# Patient Record
Sex: Female | Born: 1995 | Race: White | Hispanic: No | Marital: Married | Smoking: Current every day smoker
Health system: Southern US, Community
[De-identification: ages and names within clinical notes are randomized; demographics above are authoritative.]

## PROBLEM LIST (undated history)

## (undated) DIAGNOSIS — K219 Gastro-esophageal reflux disease without esophagitis: Secondary | ICD-10-CM

## (undated) DIAGNOSIS — Z8489 Family history of other specified conditions: Secondary | ICD-10-CM

## (undated) DIAGNOSIS — D649 Anemia, unspecified: Secondary | ICD-10-CM

## (undated) DIAGNOSIS — T8859XA Other complications of anesthesia, initial encounter: Secondary | ICD-10-CM

## (undated) DIAGNOSIS — I7300 Raynaud's syndrome without gangrene: Secondary | ICD-10-CM

## (undated) DIAGNOSIS — R030 Elevated blood-pressure reading, without diagnosis of hypertension: Secondary | ICD-10-CM

## (undated) HISTORY — PX: WISDOM TOOTH EXTRACTION: SHX21

---
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
## 2018-02-17 ENCOUNTER — Encounter: Payer: Self-pay | Admitting: Obstetrics and Gynecology

## 2018-03-20 ENCOUNTER — Ambulatory Visit: Payer: Self-pay | Admitting: Nurse Practitioner

## 2018-03-20 ENCOUNTER — Other Ambulatory Visit: Payer: Self-pay

## 2020-01-24 ENCOUNTER — Ambulatory Visit: Payer: Self-pay | Admitting: Nurse Practitioner

## 2020-01-26 ENCOUNTER — Telehealth: Payer: Self-pay

## 2020-01-26 NOTE — Telephone Encounter (Signed)
UNABLE TO CONFIRM  01-28-20 APPT WAS NOT A WORKING NUMBER LISTED IN PATIENTS VHART.

## 2020-01-28 ENCOUNTER — Encounter: Payer: Self-pay | Admitting: Nurse Practitioner

## 2020-01-28 ENCOUNTER — Encounter (INDEPENDENT_AMBULATORY_CARE_PROVIDER_SITE_OTHER): Payer: Self-pay

## 2020-01-28 ENCOUNTER — Ambulatory Visit: Payer: BC Managed Care – PPO | Admitting: Nurse Practitioner

## 2020-01-28 ENCOUNTER — Other Ambulatory Visit: Payer: Self-pay

## 2020-01-28 VITALS — BP 125/85 | HR 103 | Temp 98.0°F | Resp 16 | Ht 64.0 in | Wt 175.6 lb

## 2020-01-28 DIAGNOSIS — R209 Unspecified disturbances of skin sensation: Secondary | ICD-10-CM | POA: Diagnosis not present

## 2020-01-28 DIAGNOSIS — I7300 Raynaud's syndrome without gangrene: Secondary | ICD-10-CM | POA: Diagnosis not present

## 2020-01-28 DIAGNOSIS — R0989 Other specified symptoms and signs involving the circulatory and respiratory systems: Secondary | ICD-10-CM

## 2020-01-28 DIAGNOSIS — R5383 Other fatigue: Secondary | ICD-10-CM | POA: Diagnosis not present

## 2020-01-28 DIAGNOSIS — R300 Dysuria: Secondary | ICD-10-CM

## 2020-01-28 LAB — POCT URINALYSIS DIPSTICK
Bilirubin, UA: NEGATIVE
Glucose, UA: NEGATIVE
Ketones, UA: NEGATIVE
Leukocytes, UA: NEGATIVE
Nitrite, UA: NEGATIVE
Protein, UA: NEGATIVE
Spec Grav, UA: 1.015 (ref 1.010–1.025)
Urobilinogen, UA: 0.2 E.U./dL
pH, UA: 7.5 (ref 5.0–8.0)

## 2020-01-28 NOTE — Progress Notes (Signed)
[facility] [place], [place] [address]  Internal MEDICINE  Office Visit Note  Patient Name: Kathryn Estrada  [id number]  [id number]  Date of Service: 01/30/2020    Pt is here for a sick visit.   Chief Complaint  Patient presents with  . Urinary Tract Infection    frequency   . Eye Problem    gets blood shot red, when stressed sometimes and at night time   . Cold Feet    feet stay cold, hurt,  they are sometimes white or sometimes black, numbing, tingling, doesnt have to be cold outside   . Weight Management     The patient is here for acute visit. She states that her feet are frequently cold. When they get cold, they are painful. They will often turn blue or turn black. She has to use heating pad or two pairs of socks in order to get them warm and it does take a long time for them to get better. She has not had blood work done in some time. Has been on thyroid disease in the past. This caused her to have extreme hunger and gain weight. She has not been on this medication for some time.      Current Medication:  Outpatient Encounter Medications as of 01/28/2020  Medication Sig  . [DISCONTINUED] drospirenone-ethinyl estradiol (YASMIN,ZARAH,SYEDA) 3-0.03 MG tablet    No facility-administered encounter medications on file as of 01/28/2020.      Medical History: History reviewed. No pertinent past medical history.   Today's Vitals   01/28/20 1357  BP: 125/85  Pulse: (!) 103  Resp: 16  Temp: 98 F (36.7 C)  SpO2: 99%  Weight: 175 lb 9.6 oz (79.7 kg)  Height: 5\' 4"  (1.626 m)   Body mass index is 30.14 kg/m.  Review of Systems  Constitutional: Positive for fatigue. Negative for chills and unexpected weight change.  HENT: Negative for congestion, postnasal drip, rhinorrhea, sneezing and sore throat.   Respiratory: Negative for cough, chest tightness, shortness of breath and wheezing.   Cardiovascular: Negative for chest pain and  palpitations.       Very cold lower legs and feet. Can be painful.   Gastrointestinal: Negative for abdominal pain, constipation, diarrhea, nausea and vomiting.  Endocrine: Negative for cold intolerance, heat intolerance, polydipsia and polyuria.  Musculoskeletal: Positive for arthralgias. Negative for back pain, joint swelling and neck pain.  Skin: Positive for color change. Negative for rash.       Blue or black discoloring to the lower extremities and feet, especially when the feet are cold.   Allergic/Immunologic: Negative for environmental allergies.  Neurological: Negative for dizziness, tremors, numbness and headaches.  Hematological: Negative for adenopathy. Does not bruise/bleed easily.  Psychiatric/Behavioral: Negative for behavioral problems (Depression), sleep disturbance and suicidal ideas. The patient is not nervous/anxious.     Physical Exam Vitals and nursing note reviewed.  Constitutional:      General: She is not in acute distress.    Appearance: Normal appearance. She is well-developed. She is not diaphoretic.  HENT:     Head: Normocephalic and atraumatic.     Nose: Nose normal.     Mouth/Throat:     Pharynx: No oropharyngeal exudate.  Eyes:     Pupils: Pupils are equal, round, and reactive to light.  Neck:     Thyroid: No thyromegaly.     Vascular: No JVD.     Trachea: No tracheal deviation.  Cardiovascular:  Rate and Rhythm: Normal rate and regular rhythm.     Heart sounds: Normal heart sounds. No murmur. No friction rub. No gallop.      Comments: Both feet and lower legs are cool to touch with delayed capillary refill. Per ABI results today, there is mildly decreased arterial circulation to the right lower leg.  Pulmonary:     Effort: Pulmonary effort is normal. No respiratory distress.     Breath sounds: Normal breath sounds. No wheezing or rales.  Chest:     Chest wall: No tenderness.  Abdominal:     General: Bowel sounds are normal.     Palpations:  Abdomen is soft.     Tenderness: There is no abdominal tenderness.  Genitourinary:    Comments: Urine sample is positive for moderate blood .no evidence of infection or other abnormalities.  Musculoskeletal:        General: Normal range of motion.     Cervical back: Normal range of motion and neck supple.  Lymphadenopathy:     Cervical: No cervical adenopathy.  Skin:    General: Skin is warm and dry.     Capillary Refill: Capillary refill takes more than 3 seconds.  Neurological:     Mental Status: She is alert and oriented to person, place, and time.     Cranial Nerves: No cranial nerve deficit.  Psychiatric:        Mood and Affect: Mood normal.        Behavior: Behavior normal.        Thought Content: Thought content normal.        Judgment: Judgment normal.   Assessment/Plan: 1. Raynaud's phenomenon without gangrene ABI done in the office today. Mildly decreased arterial circulation of right lower extremity. Will get arterial duplex ultrasound of both lower extremities for further evaluation.  - [place] Lower Ext Art Bilat; Future  2. Prolonged capillary refill time ABI done in the office today. Mildly decreased arterial circulation of right lower extremity. Will get arterial duplex ultrasound of both lower extremities for further evaluation.  - [place] Lower Ext Art Bilat; Future  3. Bilateral cold feet ABI done in the office today. Mildly decreased arterial circulation of right lower extremity. Will get arterial duplex ultrasound of both lower extremities for further evaluation.  - POCT ABI Screening Pilot No Charge - [place] Lower Ext Art Bilat; Future  4. Other fatigue Will check labs including anemia, thyroid, and connective tissue panels for further evaluation.   5. Dysuria There is moderate blood in the urine without evidence of infection or other abnormalities.  - POCT Urinalysis Dipstick  General Counseling: ahlivia gains understanding of the findings of todays visit and  agrees with plan of treatment. I have discussed any further diagnostic evaluation that may be needed or ordered today. We also reviewed her medications today. she has been encouraged to call the office with any questions or concerns that should arise related to todays visit.    Counseling:  This patient was seen by Leretha Pol FNP Collaboration with Dr Lavera Guise as a part of collaborative care agreement  Orders Placed This Encounter  Procedures  . [place] Lower Ext Art Bilat  . POCT Urinalysis Dipstick  . POCT ABI Screening Pilot No Charge      Time spent: 45 Minutes

## 2020-01-30 DIAGNOSIS — R209 Unspecified disturbances of skin sensation: Secondary | ICD-10-CM | POA: Insufficient documentation

## 2020-01-30 DIAGNOSIS — R5383 Other fatigue: Secondary | ICD-10-CM | POA: Insufficient documentation

## 2020-01-30 DIAGNOSIS — R0989 Other specified symptoms and signs involving the circulatory and respiratory systems: Secondary | ICD-10-CM | POA: Insufficient documentation

## 2020-01-30 DIAGNOSIS — I7300 Raynaud's syndrome without gangrene: Secondary | ICD-10-CM | POA: Insufficient documentation

## 2020-01-30 DIAGNOSIS — R300 Dysuria: Secondary | ICD-10-CM | POA: Insufficient documentation

## 2020-02-22 ENCOUNTER — Other Ambulatory Visit: Payer: Self-pay | Admitting: Nurse Practitioner

## 2020-02-23 NOTE — Progress Notes (Signed)
Waiting on few results. Discuss at visit 03/03/2020

## 2020-02-25 ENCOUNTER — Other Ambulatory Visit: Payer: Self-pay

## 2020-02-25 ENCOUNTER — Ambulatory Visit (INDEPENDENT_AMBULATORY_CARE_PROVIDER_SITE_OTHER): Payer: BC Managed Care – PPO

## 2020-02-25 DIAGNOSIS — R209 Unspecified disturbances of skin sensation: Secondary | ICD-10-CM

## 2020-02-25 DIAGNOSIS — I7300 Raynaud's syndrome without gangrene: Secondary | ICD-10-CM

## 2020-02-25 DIAGNOSIS — R0989 Other specified symptoms and signs involving the circulatory and respiratory systems: Secondary | ICD-10-CM

## 2020-02-26 LAB — COMPREHENSIVE METABOLIC PANEL
ALT: 11 IU/L (ref 0–32)
AST: 19 IU/L (ref 0–40)
Albumin/Globulin Ratio: 1.8 (ref 1.2–2.2)
Albumin: 4.7 g/dL (ref 3.9–5.0)
Alkaline Phosphatase: 77 IU/L (ref 39–117)
BUN/Creatinine Ratio: 13 (ref 9–23)
BUN: 10 mg/dL (ref 6–20)
Bilirubin Total: 0.3 mg/dL (ref 0.0–1.2)
CO2: 24 mmol/L (ref 20–29)
Calcium: 10 mg/dL (ref 8.7–10.2)
Chloride: 101 mmol/L (ref 96–106)
Creatinine, Ser: 0.8 mg/dL (ref 0.57–1.00)
GFR calc Af Amer: 120 mL/min/{1.73_m2} (ref >59)
GFR calc non Af Amer: 104 mL/min/{1.73_m2} (ref >59)
Globulin, Total: 2.6 g/dL (ref 1.5–4.5)
Glucose: 91 mg/dL (ref 65–99)
Potassium: 4.2 mmol/L (ref 3.5–5.2)
Sodium: 138 mmol/L (ref 134–144)
Total Protein: 7.3 g/dL (ref 6.0–8.5)

## 2020-02-26 LAB — HGB A1C W/O EAG: Hgb A1c MFr Bld: 4.9 % (ref 4.8–5.6)

## 2020-02-26 LAB — CBC
Hematocrit: 43.4 % (ref 34.0–46.6)
Hemoglobin: 14.5 g/dL (ref 11.1–15.9)
MCH: 29.5 pg (ref 26.6–33.0)
MCHC: 33.4 g/dL (ref 31.5–35.7)
MCV: 88 fL (ref 79–97)
Platelets: 273 10*3/uL (ref 150–450)
RBC: 4.92 x10E6/uL (ref 3.77–5.28)
RDW: 12.1 % (ref 11.7–15.4)
WBC: 7.1 10*3/uL (ref 3.4–10.8)

## 2020-02-26 LAB — B12 AND FOLATE PANEL
Folate: 10.8 ng/mL (ref >3.0)
Vitamin B-12: 225 pg/mL — ABNORMAL LOW (ref 232–1245)

## 2020-02-26 LAB — IRON AND TIBC
Iron Saturation: 35 % (ref 15–55)
Iron: 105 ug/dL (ref 27–159)
Total Iron Binding Capacity: 299 ug/dL (ref 250–450)
UIBC: 194 ug/dL (ref 131–425)

## 2020-02-26 LAB — FERRITIN: Ferritin: 67 ng/mL (ref 15–150)

## 2020-02-26 LAB — ANA W/REFLEX IF POSITIVE: Anti Nuclear Antibody (ANA): NEGATIVE

## 2020-02-26 LAB — TSH: TSH: 1.57 u[IU]/mL (ref 0.450–4.500)

## 2020-02-26 LAB — THYROID AUTOABS TEST GROUP
Anti-Thyroglobulin Antibodies: 1.5 IU/mL — ABNORMAL HIGH
Anti-Thyroid Peroxidase Ab: 568 IU/mL — ABNORMAL HIGH

## 2020-02-26 LAB — SEDIMENTATION RATE: Sed Rate: 15 mm/hr (ref 0–32)

## 2020-02-26 LAB — RHEUMATOID FACTOR: Rheumatoid fact SerPl-aCnc: <10 IU/mL (ref 0.0–13.9)

## 2020-02-26 LAB — VITAMIN D 25 HYDROXY (VIT D DEFICIENCY, FRACTURES): Vit D, 25-Hydroxy: 25.1 ng/mL — ABNORMAL LOW (ref 30.0–100.0)

## 2020-02-26 LAB — T4, FREE: Free T4: 1.11 ng/dL (ref 0.82–1.77)

## 2020-03-01 ENCOUNTER — Telehealth: Payer: Self-pay

## 2020-03-01 NOTE — Progress Notes (Signed)
Normal circulation. Discuss at visit 03/03/2020

## 2020-03-01 NOTE — Telephone Encounter (Signed)
CONFIRMED AND SCREENED FOR 03-03-20 OV.

## 2020-03-03 ENCOUNTER — Other Ambulatory Visit: Payer: Self-pay

## 2020-03-03 ENCOUNTER — Ambulatory Visit: Payer: BC Managed Care – PPO | Admitting: Nurse Practitioner

## 2020-03-03 ENCOUNTER — Encounter: Payer: Self-pay | Admitting: Nurse Practitioner

## 2020-03-03 VITALS — BP 119/74 | HR 85 | Temp 97.8°F | Resp 16 | Ht 65.0 in | Wt 177.2 lb

## 2020-03-03 DIAGNOSIS — Z6829 Body mass index (BMI) 29.0-29.9, adult: Secondary | ICD-10-CM

## 2020-03-03 DIAGNOSIS — R7989 Other specified abnormal findings of blood chemistry: Secondary | ICD-10-CM | POA: Diagnosis not present

## 2020-03-03 DIAGNOSIS — I7300 Raynaud's syndrome without gangrene: Secondary | ICD-10-CM

## 2020-03-03 DIAGNOSIS — R0989 Other specified symptoms and signs involving the circulatory and respiratory systems: Secondary | ICD-10-CM | POA: Diagnosis not present

## 2020-03-03 DIAGNOSIS — R209 Unspecified disturbances of skin sensation: Secondary | ICD-10-CM

## 2020-03-03 MED ORDER — PHENTERMINE HCL 37.5 MG PO TABS: 37.5000 mg | ORAL_TABLET | Freq: Every day | ORAL | 1 refills | Status: DC

## 2020-03-03 NOTE — Progress Notes (Signed)
[facility] [place], [place] [address]  Internal MEDICINE  Office Visit Note  Patient Name: Kathryn Estrada  [id number]  [id number]  Date of Service: 03/12/2020  Chief Complaint  Patient presents with  . Follow-up    [place] results    The patient is here for follow up of lab and ultrasound results. She had been having frequently cold feet. When they get cold, they are painful. They will often turn blue or turn black. She has to use heating pad or two pairs of socks in order to get them warm and it does take a long time for them to get better. An ABI was done at her last visit, which was unremarkable.  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 Ultrasound of the arteries in both lower extremities indicate no evidence of stenosis. She continues to have significant fatigue and weight gain. She would like to get started on appetite suppressant to help get started with healthy weight loss program.      Current Medication: Outpatient Encounter Medications as of 03/03/2020  Medication Sig  . phentermine (ADIPEX-P) 37.5 MG tablet Take 1 tablet (37.5 mg total) by mouth daily before breakfast.   No facility-administered encounter medications on file as of 03/03/2020.    Surgical History: History reviewed. No pertinent surgical history.  Medical History: History reviewed. No pertinent past medical history.  Family History: Family History  Problem Relation Age of Onset  . Neurofibromatosis Mother   . Anemia Mother   . Diabetes Other     Social History   Socioeconomic History  . Marital status: Single    Spouse name: Not on file  . Number of children: Not on file  . Years of education: Not on file  . Highest education level: Not on file  Occupational History  . Not on file  Tobacco Use  . Smoking status: Former Smoker    Types: Cigarettes  . Smokeless tobacco: Never Used   Substance and Sexual Activity  . Alcohol use: Yes    Comment: ocassinally/ once a month   . Drug use: Never  . Sexual activity: Not on file  Other Topics Concern  . Not on file  Social History Narrative  . Not on file   Social Determinants of Health   Financial Resource Strain:   . Difficulty of Paying Living Expenses:   Food Insecurity:   . Worried About Charity fundraiser in the Last Year:   . Arboriculturist in the Last Year:   Transportation Needs:   . Film/video editor (Medical):   Marland Kitchen Lack of Transportation (Non-Medical):   Physical Activity:   . Days of Exercise per Week:   . Minutes of Exercise per Session:   Stress:   . Feeling of Stress :   Social Connections:   . Frequency of Communication with Friends and Family:   . Frequency of Social Gatherings with Friends and Family:   . Attends Religious Services:   . Active Member of Clubs or Organizations:   . Attends Archivist Meetings:   Marland Kitchen Marital Status:   Intimate Partner Violence:   . Fear of Current or Ex-Partner:   . Emotionally Abused:   Marland Kitchen Physically Abused:   . Sexually Abused:       Review of Systems  Constitutional: Positive for fatigue. Negative for chills and unexpected weight change.  Two pound weight gin since her last visit.   HENT: Negative for congestion, postnasal drip, rhinorrhea, sneezing and sore throat.   Respiratory: Negative for cough, chest tightness, shortness of breath and wheezing.   Cardiovascular: Negative for chest pain and palpitations.       Very cold lower legs and feet. Can be painful.   Gastrointestinal: Negative for abdominal pain, constipation, diarrhea, nausea and vomiting.  Endocrine: Negative for cold intolerance, heat intolerance, polydipsia and polyuria.  Musculoskeletal: Positive for arthralgias. Negative for back pain, joint swelling and neck pain.  Skin: Positive for color change. Negative for rash.       Blue or black discoloring to the lower  extremities and feet, especially when the feet are cold.   Allergic/Immunologic: Negative for environmental allergies.  Neurological: Negative for dizziness, tremors, numbness and headaches.  Hematological: Negative for adenopathy. Does not bruise/bleed easily.  Psychiatric/Behavioral: Negative for behavioral problems (Depression), sleep disturbance and suicidal ideas. The patient is not nervous/anxious.     Today's Vitals   03/03/20 1533  BP: 119/74  Pulse: 85  Resp: 16  Temp: 97.8 F (36.6 C)  SpO2: 100%  Weight: 177 lb 3.2 oz (80.4 kg)  Height: 5\' 5"  (1.651 m)   Body mass index is 29.49 kg/m.  Physical Exam Vitals and nursing note reviewed.  Constitutional:      General: She is not in acute distress.    Appearance: Normal appearance. She is well-developed. She is not diaphoretic.  HENT:     Head: Normocephalic and atraumatic.     Nose: Nose normal.     Mouth/Throat:     Pharynx: No oropharyngeal exudate.  Eyes:     Pupils: Pupils are equal, round, and reactive to light.  Neck:     Thyroid: No thyromegaly.     Vascular: No JVD.     Trachea: No tracheal deviation.  Cardiovascular:     Rate and Rhythm: Normal rate and regular rhythm.     Heart sounds: Normal heart sounds. No murmur. No friction rub. No gallop.      Comments: Both feet and lower legs are cool to touch with delayed capillary refill. Per ABI results today, there is mildly decreased arterial circulation to the right lower leg.  Pulmonary:     Effort: Pulmonary effort is normal. No respiratory distress.     Breath sounds: Normal breath sounds. No wheezing or rales.  Chest:     Chest wall: No tenderness.  Abdominal:     General: Bowel sounds are normal.     Palpations: Abdomen is soft.     Tenderness: There is no abdominal tenderness.  Genitourinary:    Comments: Urine sample is positive for moderate blood .no evidence of infection or other abnormalities.  Musculoskeletal:        General: Normal range  of motion.     Cervical back: Normal range of motion and neck supple.  Lymphadenopathy:     Cervical: No cervical adenopathy.  Skin:    General: Skin is warm and dry.     Capillary Refill: Capillary refill takes more than 3 seconds.  Neurological:     Mental Status: She is alert and oriented to person, place, and time.     Cranial Nerves: No cranial nerve deficit.  Psychiatric:        Mood and Affect: Mood normal.        Behavior: Behavior normal.        Thought Content: Thought content normal.  Judgment: Judgment normal.     Assessment/Plan: 1. Raynaud's phenomenon without gangrene Reviewed results of ABI and ultrasound of arteries of bilateral lower extremities. Studies were unremarkable. Symptoms persistent. Refer to vascular surgery for further evaluation.  - Ambulatory referral to Vascular Surgery  2. Prolonged capillary refill time Capillary refill moderately delayed. Refer to vascular surgery for further evaluation.  - Ambulatory referral to Vascular Surgery  3. Bilateral cold feet Refer to vascular surgery for further evaluation.  - Ambulatory referral to Vascular Surgery  4. Abnormal thyroid blood test Ultrasound of thyroid for further evaluation.  - US THYROID; Future  5. BMI 29.0-29.9,adult Trial of phentermine to help with weight loss. Recommend 1200 calorie diet. Recommend she incorporate exercise into her daily routine.  - phentermine (ADIPEX-P) 37.5 MG tablet; Take 1 tablet (37.5 mg total) by mouth daily before breakfast.  Dispense: 30 tablet; Refill: 1  General Counseling: Starley verbalizes understanding of the findings of todays visit and agrees with plan of treatment. I have discussed any further diagnostic evaluation that may be needed or ordered today. We also reviewed her medications today. she has been encouraged to call the office with any questions or concerns that should arise related to todays visit.   There is a liability release in patients'  chart. There has been a 10 minute discussion about the side effects including but not limited to elevated blood pressure, anxiety, lack of sleep and dry mouth. Pt understands and will like to start/continue on appetite suppressant at this time. There will be one month RX given at the time of visit with proper follow up. Nova diet plan with restricted calories is given to the pt. Pt understands and agrees with  plan of treatment  This patient was seen by Leretha Pol FNP Collaboration with Dr Lavera Guise as a part of collaborative care agreement  Orders Placed This Encounter  Procedures  . US THYROID  . Ambulatory referral to Vascular Surgery    Meds ordered this encounter  Medications  . phentermine (ADIPEX-P) 37.5 MG tablet    Sig: Take 1 tablet (37.5 mg total) by mouth daily before breakfast.    Dispense:  30 tablet    Refill:  1    Order Specific Question:   Supervising Provider    Answer:   Lavera Guise [id number]    Total time spent: 30 Minutes   Time spent includes review of chart, medications, test results, and follow up plan with the patient.      Dr Lavera Guise Internal medicine

## 2020-03-12 DIAGNOSIS — R7989 Other specified abnormal findings of blood chemistry: Secondary | ICD-10-CM | POA: Insufficient documentation

## 2020-03-12 DIAGNOSIS — Z6829 Body mass index (BMI) 29.0-29.9, adult: Secondary | ICD-10-CM | POA: Insufficient documentation

## 2020-03-28 ENCOUNTER — Encounter (INDEPENDENT_AMBULATORY_CARE_PROVIDER_SITE_OTHER): Payer: Self-pay | Admitting: Vascular Surgery

## 2020-03-28 ENCOUNTER — Other Ambulatory Visit: Payer: Self-pay

## 2020-03-28 ENCOUNTER — Ambulatory Visit (INDEPENDENT_AMBULATORY_CARE_PROVIDER_SITE_OTHER): Payer: BC Managed Care – PPO | Admitting: Vascular Surgery

## 2020-03-28 VITALS — BP 125/82 | HR 96 | Ht 65.0 in | Wt 174.0 lb

## 2020-03-28 DIAGNOSIS — F17200 Nicotine dependence, unspecified, uncomplicated: Secondary | ICD-10-CM | POA: Diagnosis not present

## 2020-03-28 DIAGNOSIS — I7300 Raynaud's syndrome without gangrene: Secondary | ICD-10-CM | POA: Diagnosis not present

## 2020-03-28 NOTE — Progress Notes (Signed)
Patient ID: Kathryn Estrada, female   DOB: 12-17-1996, 24 y.o.   MRN: [id number]  Chief Complaint  Patient presents with  . New Patient (Initial Visit)    Kathryn Estrada    HPI Kathryn Estrada is a 24 y.o. female.  I am asked to see the patient by H. Boscia for evaluation of Raynaud's disease.  Her symptoms been worst over the past 4 to 5 months, but is something she has noticed some degree for many years.  She basically has cold, purple feet all of the time.  She will have intermittent episodes where toes or her forefoot will turn white and very painful.  Outside of the episodes where the toes turn white, it is not painful.  She has some symptoms in her hand but nowhere near as severe.  Both her hands and feet are cool much of the time.  She denies any trauma or injury.  She has had an extensive medical work-up by her primary care physician including what sounds like lower extremity ultrasounds recently.  Both feet are affected about the same.  This does not really come on only in the cold and she notices it all the time.  It is difficult to say if cold exacerbates it for her.     No past medical history on file.  No past surgical history on file.   Family History  Problem Relation Age of Onset  . Neurofibromatosis Mother   . Anemia Mother   . Diabetes Other     Social History   Tobacco Use  . Smoking status: Former Smoker    Types: Cigarettes  . Smokeless tobacco: Never Used  Substance Use Topics  . Alcohol use: Yes    Comment: ocassinally/ once a month   . Drug use: Never     No Known Allergies  Current Outpatient Medications  Medication Sig Dispense Refill  . phentermine (ADIPEX-P) 37.5 MG tablet Take 1 tablet (37.5 mg total) by mouth daily before breakfast. 30 tablet 1   No current facility-administered medications for this visit.      REVIEW OF SYSTEMS (Negative unless checked)  Constitutional: [] Weight loss  [] Fever  [] Chills Cardiac: [] Chest Estrada   [] Chest  pressure   [] Palpitations   [] Shortness of breath when laying flat   [] Shortness of breath at rest   [] Shortness of breath with exertion. Vascular:  [] Estrada in legs with walking   [] Estrada in legs at rest   [] Estrada in legs when laying flat   [] Claudication   [x] Estrada in feet when walking  [x] Estrada in feet at rest  [] Estrada in feet when laying flat   [] History of DVT   [] Phlebitis   [] Swelling in legs   [] Varicose veins   [] Non-healing ulcers Pulmonary:   [] Uses home oxygen   [] Productive cough   [] Hemoptysis   [] Wheeze  [] COPD   [] Asthma Neurologic:  [] Dizziness  [] Blackouts   [] Seizures   [] History of stroke   [] History of TIA  [] Aphasia   [] Temporary blindness   [] Dysphagia   [] Weakness or numbness in arms   [] Weakness or numbness in legs Musculoskeletal:  [] Arthritis   [] Joint swelling   [] Joint Estrada   [] Low back Estrada Hematologic:  [] Easy bruising  [] Easy bleeding   [] Hypercoagulable state   [] Anemic  [] Hepatitis Gastrointestinal:  [] Blood in stool   [] Vomiting blood  [] Gastroesophageal reflux/heartburn   [] Abdominal Estrada Genitourinary:  [] Chronic kidney disease   [] Difficult urination  [] Frequent urination  [] Burning with urination   []   Hematuria Skin:  [] Rashes   [] Ulcers   [] Wounds Psychological:  [] History of anxiety   []  History of major depression.    Physical Exam BP 125/82   Pulse 96   Ht 5\' 5"  (1.651 m)   Wt 174 lb (78.9 kg)   BMI 28.96 kg/m  Gen:  WD/WN, NAD Head: [place]/AT, No temporalis wasting.  Ear/Nose/Throat: Hearing grossly intact, nares w/o erythema or drainage, oropharynx w/o Erythema/Exudate Eyes: Conjunctiva clear, sclera non-icteric  Neck: trachea midline.  No JVD.  Pulmonary:  Good air movement, respirations not labored, no use of accessory muscles  Cardiac: RRR, no JVD Vascular:  Vessel Right Left  Radial Palpable Palpable                          DP 2+ 2+  PT 2+ 2+   Gastrointestinal:. No masses, surgical incisions, or scars. Musculoskeletal: M/S 5/5 throughout.   Extremities without ischemic changes.  No deformity or atrophy. No edema.  Purplish discoloration is present throughout both feet and ankle areas.  No open wounds or ulceration.  Mild cyanosis of the tips of several fingers but nothing is profound as her feet.  Feet and hands are slightly cool to the touch Neurologic: Sensation grossly intact in extremities.  Symmetrical.  Speech is fluent. Motor exam as listed above. Psychiatric: Judgment intact, Mood & affect appropriate for pt's clinical situation. Dermatologic: No rashes or ulcers noted.  No cellulitis or open wounds.    Radiology No results found.  Labs Recent Results (from the past 2160 hour(s))  POCT Urinalysis Dipstick     Status: None   Collection Time: 01/28/20  2:20 PM  Result Value Ref Range   Color, UA     Clarity, UA     Glucose, UA Negative Negative   Bilirubin, UA negative    Ketones, UA negative    Spec Grav, UA 1.015 1.010 - 1.025   Blood, UA moderate    pH, UA 7.5 5.0 - 8.0   Protein, UA Negative Negative   Urobilinogen, UA 0.2 0.2 or 1.0 E.U./dL   Nitrite, UA negative    Leukocytes, UA Negative Negative   Appearance     Odor    Comprehensive metabolic panel     Status: None   Collection Time: 02/22/20  3:58 PM  Result Value Ref Range   Glucose 91 65 - 99 mg/dL   BUN 10 6 - 20 mg/dL   Creatinine, Ser 0.80 0.57 - 1.00 mg/dL   GFR calc non Af Amer 104 >59 mL/min/1.73   GFR calc Af Amer 120 >59 mL/min/1.73   BUN/Creatinine Ratio 13 9 - 23   Sodium 138 134 - 144 mmol/L   Potassium 4.2 3.5 - 5.2 mmol/L   Chloride 101 96 - 106 mmol/L   CO2 24 20 - 29 mmol/L   Calcium 10.0 8.7 - 10.2 mg/dL   Total Protein 7.3 6.0 - 8.5 g/dL   Albumin 4.7 3.9 - 5.0 g/dL   Globulin, Total 2.6 1.5 - 4.5 g/dL   Albumin/Globulin Ratio 1.8 1.2 - 2.2   Bilirubin Total 0.3 0.0 - 1.2 mg/dL   Alkaline Phosphatase 77 39 - 117 IU/L   AST 19 0 - 40 IU/L   ALT 11 0 - 32 IU/L  CBC     Status: None   Collection Time: 02/22/20  3:58 PM   Result Value Ref Range   WBC 7.1 3.4 - 10.8 x10E3/uL  RBC 4.92 3.77 - 5.28 x10E6/uL   Hemoglobin 14.5 11.1 - 15.9 g/dL   Hematocrit 43.4 34.0 - 46.6 %   MCV 88 79 - 97 fL   MCH 29.5 26.6 - 33.0 pg   MCHC 33.4 31.5 - 35.7 g/dL   RDW 12.1 11.7 - 15.4 %   Platelets 273 150 - 450 x10E3/uL  Iron and TIBC     Status: None   Collection Time: 02/22/20  3:58 PM  Result Value Ref Range   Total Iron Binding Capacity 299 250 - 450 ug/dL   UIBC 194 131 - 425 ug/dL   Iron 105 27 - 159 ug/dL   Iron Saturation 35 15 - 55 %  B12 and Folate Panel     Status: Abnormal   Collection Time: 02/22/20  3:58 PM  Result Value Ref Range   Vitamin B-12 225 (L) 232 - 1,245 pg/mL   Folate 10.8 >3.0 ng/mL    Comment: A serum folate concentration of less than 3.1 ng/mL is considered to represent clinical deficiency.   Hgb A1c w/o eAG     Status: None   Collection Time: 02/22/20  3:58 PM  Result Value Ref Range   Hgb A1c MFr Bld 4.9 4.8 - 5.6 %    Comment:          Prediabetes: 5.7 - 6.4          Diabetes: >6.4          Glycemic control for adults with diabetes: <7.0   T4, free     Status: None   Collection Time: 02/22/20  3:58 PM  Result Value Ref Range   Free T4 1.11 0.82 - 1.77 ng/dL  TSH     Status: None   Collection Time: 02/22/20  3:58 PM  Result Value Ref Range   TSH 1.570 0.450 - 4.500 uIU/mL  Rheumatoid factor     Status: None   Collection Time: 02/22/20  3:58 PM  Result Value Ref Range   Rhuematoid fact SerPl-aCnc <10.0 0.0 - 13.9 IU/mL  VITAMIN D 25 Hydroxy (Vit-D Deficiency, Fractures)     Status: Abnormal   Collection Time: 02/22/20  3:58 PM  Result Value Ref Range   Vit D, 25-Hydroxy 25.1 (L) 30.0 - 100.0 ng/mL    Comment: Vitamin D deficiency has been defined by the [place] and an Endocrine Society practice guideline as a level of serum 25-OH vitamin D less than 20 ng/mL (1,2). The Endocrine Society went on to further define vitamin D insufficiency as a level  between 21 and 29 ng/mL (2). 1. IOM (Institute of Medicine). 2010. Dietary reference    intakes for calcium and D. [place]: The    [employer]. 2. Holick MF, Binkley Percy, Bischoff-Ferrari HA, et al.    Evaluation, treatment, and prevention of vitamin D    deficiency: an Endocrine Society clinical practice    guideline. JCEM. 2011 Jul; 96(7):1911-30.   ANA w/Reflex if Positive     Status: None   Collection Time: 02/22/20  3:58 PM  Result Value Ref Range   Anti Nuclear Antibody (ANA) Negative Negative  Thyroid AutoAbs Test Group     Status: Abnormal   Collection Time: 02/22/20  3:58 PM  Result Value Ref Range   Anti-Thyroglobulin Antibodies 1.5 (H) IU/mL    Comment: Reference Range: <1.0          Negative > or = 1.0    Positive    Anti-Thyroid  Peroxidase Ab 568 (H) IU/mL    Comment: Reference Range: All ages: <9.0   Sedimentation rate     Status: None   Collection Time: 02/22/20  3:58 PM  Result Value Ref Range   Sed Rate 15 0 - 32 mm/hr  Ferritin     Status: None   Collection Time: 02/22/20  3:58 PM  Result Value Ref Range   Ferritin 67 15 - 150 ng/mL    Assessment/Plan:  Raynaud disease The patient has fairly significant vasospastic disorder/Raynaud's disease.  This is not necessarily cold induced at this point and is present much of the time.  It is painful.  She does not have tissue loss.  I discussed with her options today.  It is strongly recommended that she quit vaping/smoking as nicotine and tobacco exposure going to worsen her vasospasm.  Her blood pressure is normal, we could certainly consider using calcium channel blockers or Nitropaste to relieve some of the vasospasm if her Estrada is severe enough.  She does not want that at this time and that is certainly reasonable.  Even though she has symptoms when it is not cold, I told her that cold will worsen the symptoms and I recommend she continue to wear gloves and socks no matter the temperature and try  to avoid cold stimulation.  I have offered her a follow-up interval in several months versus calling us if her symptoms worsen.  She would prefer the latter.  Tobacco use disorder The patient has ongoing use of vaping.  We discussed today that nicotine and tobacco will precipitate vasospasm and markedly worsen her vasospastic symptoms.  I have strongly recommended she quit smoking for both this reason as well as the many health benefits to cessation that would be present going forward.  We spent 3 to 4 minutes discussing this and she voices her understanding.      Kathryn Estrada 03/28/2020, 2:42 PM   This note was created with Dragon medical transcription system.  Any errors from dictation are unintentional.

## 2020-03-28 NOTE — Assessment & Plan Note (Signed)
The patient has fairly significant vasospastic disorder/Raynaud's disease.  This is not necessarily cold induced at this point and is present much of the time.  It is painful.  She does not have tissue loss.  I discussed with her options today.  It is strongly recommended that she quit vaping/smoking as nicotine and tobacco exposure going to worsen her vasospasm.  Her blood pressure is normal, we could certainly consider using calcium channel blockers or Nitropaste to relieve some of the vasospasm if her pain is severe enough.  She does not want that at this time and that is certainly reasonable.  Even though she has symptoms when it is not cold, I told her that cold will worsen the symptoms and I recommend she continue to wear gloves and socks no matter the temperature and try to avoid cold stimulation.  I have offered her a follow-up interval in several months versus calling us if her symptoms worsen.  She would prefer the latter.

## 2020-03-28 NOTE — Assessment & Plan Note (Signed)
The patient has ongoing use of vaping.  We discussed today that nicotine and tobacco will precipitate vasospasm and markedly worsen her vasospastic symptoms.  I have strongly recommended she quit smoking for both this reason as well as the many health benefits to cessation that would be present going forward.  We spent 3 to 4 minutes discussing this and she voices her understanding.

## 2020-03-28 NOTE — Patient Instructions (Signed)
Raynaud Phenomenon ° °Raynaud phenomenon is a condition that affects the blood vessels (arteries) that carry blood to your fingers and toes. The arteries that supply blood to your ears, lips, nipples, or the tip of your nose might also be affected. Raynaud phenomenon causes the arteries to become narrow temporarily (spasm). As a result, the flow of blood to the affected areas is temporarily decreased. This usually occurs in response to cold temperatures or stress. During an attack, the skin in the affected areas turns white, then blue, and finally red. You may also feel tingling or numbness in those areas. °Attacks usually last for only a brief period, and then the blood flow to the area returns to normal. In most cases, Raynaud phenomenon does not cause serious health problems. °What are the causes? °In many cases, the cause of this condition is not known. The condition may occur on its own (primary Raynaud phenomenon) or may be associated with other diseases or factors (secondary Raynaud phenomenon). °Possible causes may include: °· Diseases or medical conditions that damage the arteries. °· Injuries and repetitive actions that hurt the hands or feet. °· Being exposed to certain chemicals. °· Taking medicines that narrow the arteries. °· Other medical conditions, such as lupus, scleroderma, rheumatoid arthritis, thyroid problems, blood disorders, Sjogren syndrome, or atherosclerosis. °What increases the risk? °The following factors may make you more likely to develop this condition: °· Being 20-40 years old. °· Being female. °· Having a family history of Raynaud phenomenon. °· Living in a cold climate. °· Smoking. °What are the signs or symptoms? °Symptoms of this condition usually occur when you are exposed to cold temperatures or when you have emotional stress. The symptoms may last for a few minutes or up to several hours. They usually affect your fingers but may also affect your toes, nipples, lips, ears, or  the tip of your nose. Symptoms may include: °· Changes in skin color. The skin in the affected areas will turn pale or white. The skin may then change from white to bluish to red as normal blood flow returns to the area. °· Numbness, tingling, or pain in the affected areas. °In severe cases, symptoms may include: °· Skin sores. °· Tissues decaying and dying (gangrene). °How is this diagnosed? °This condition may be diagnosed based on: °· Your symptoms and medical history. °· A physical exam. During the exam, you may be asked to put your hands in cold water to check for a reaction to cold temperature. °· Tests, such as: °? Blood tests to check for other diseases or conditions. °? A test to check the movement of blood through your arteries and veins (vascular ultrasound). °? A test in which the skin at the base of your fingernail is examined under a microscope (nailfold capillaroscopy). °How is this treated? °Treatment for this condition often involves making lifestyle changes and taking steps to control your exposure to cold temperatures. For more severe cases, medicine (calcium channel blockers) may be used to improve blood flow. Surgery is sometimes done to block the nerves that control the affected arteries, but this is rare. °Follow these instructions at home: °Avoiding cold temperatures °Take these steps to avoid exposure to cold: °· If possible, stay indoors during cold weather. °· When you go outside during cold weather, dress in layers and wear mittens, a hat, a scarf, and warm footwear. °· Wear mittens or gloves when handling ice or frozen food. °· Use holders for glasses or cans containing cold drinks. °·   Let warm water run for a while before taking a shower or bath. °· Warm up the car before driving in cold weather. °Lifestyle ° °· If possible, avoid stressful and emotional situations. Try to find ways to manage your stress, such as: °? Exercise. °? Yoga. °? Meditation. °? Biofeedback. °· Do not use any  products that contain nicotine or tobacco, such as cigarettes and e-cigarettes. If you need help quitting, ask your health care provider. °· Avoid secondhand smoke. °· Limit your use of caffeine. °? Switch to decaffeinated coffee, tea, and soda. °? Avoid chocolate. °· Avoid vibrating tools and machinery. °General instructions °· Protect your hands and feet from injuries, cuts, or bruises. °· Avoid wearing tight rings or wristbands. °· Wear loose fitting socks and comfortable, roomy shoes. °· Take over-the-counter and prescription medicines only as told by your health care provider. °Contact a health care provider if: °· Your discomfort becomes worse despite lifestyle changes. °· You develop sores on your fingers or toes that do not heal. °· Your fingers or toes turn black. °· You have breaks in the skin on your fingers or toes. °· You have a fever. °· You have pain or swelling in your joints. °· You have a rash. °· Your symptoms occur on only one side of your body. °Summary °· Raynaud phenomenon is a condition that affects the arteries that carry blood to your fingers, toes, ears, lips, nipples, or the tip of your nose. °· In many cases, the cause of this condition is not known. °· Symptoms of this condition include changes in skin color, and numbness and tingling of the affected area. °· Treatment for this condition includes lifestyle changes, reducing exposure to cold temperatures, and using medicines for severe cases of the condition. °· Contact your health care provider if your condition worsens despite treatment. °This information is not intended to replace advice given to you by your health care provider. Make sure you discuss any questions you have with your health care provider. °Document Revised: 12/12/2017 Document Reviewed: 01/20/2017 °[employer]vier Patient Education © 2020 [employer] ° °

## 2020-03-31 ENCOUNTER — Telehealth: Payer: Self-pay

## 2020-03-31 NOTE — Telephone Encounter (Signed)
Confirmed and screened for 04-04-20 ov.

## 2020-04-04 ENCOUNTER — Other Ambulatory Visit: Payer: Self-pay

## 2020-04-04 ENCOUNTER — Encounter: Payer: Self-pay | Admitting: Nurse Practitioner

## 2020-04-04 ENCOUNTER — Ambulatory Visit: Payer: BC Managed Care – PPO | Admitting: Nurse Practitioner

## 2020-04-04 VITALS — BP 119/80 | HR 95 | Temp 98.0°F | Resp 16 | Ht 65.0 in | Wt 177.8 lb

## 2020-04-04 DIAGNOSIS — E538 Deficiency of other specified B group vitamins: Secondary | ICD-10-CM | POA: Diagnosis not present

## 2020-04-04 DIAGNOSIS — R0989 Other specified symptoms and signs involving the circulatory and respiratory systems: Secondary | ICD-10-CM

## 2020-04-04 DIAGNOSIS — I7300 Raynaud's syndrome without gangrene: Secondary | ICD-10-CM

## 2020-04-04 MED ORDER — CYANOCOBALAMIN 1000 MCG/ML IJ SOLN
1000.0000 ug | Freq: Once | INTRAMUSCULAR | Status: AC
  Administered 2020-04-04: 1000 ug via INTRAMUSCULAR

## 2020-04-04 NOTE — Progress Notes (Signed)
[facility] [place], [place] [address]  Internal MEDICINE  Office Visit Note  Patient Name: Kathryn Estrada  [id number]  [id number]  Date of Service: 04/16/2020  Chief Complaint  Patient presents with  . Follow-up    weight management  . Foot Pain    specialist did not do anything for the issue     The patient is here for routine follow up. Was prescribed phentermine to help with weight management. She has not started to take this, as she is concerned of possible pregnancy. She has had a three pound weight gain since she was last seen. She does not need new prescription for phentermine as she is unsure she is going to take it at this time.  She did see vein and vascular provider. She, and her mother, are unhappy with the care they received. She states that she was diagnosed with Raynaud's Disease. She states that the provider she saw did not touch her feet or her legs and made diagnosis. She states that she would like to have a second opinion and further evaluation.  Labs were reviewed with the patient and her mother. She does have vitamin B12 deficiency. 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       Current Medication: Outpatient Encounter Medications as of 04/04/2020  Medication Sig  . phentermine (ADIPEX-P) 37.5 MG tablet Take 1 tablet (37.5 mg total) by mouth daily before breakfast.  . [EXPIRED] cyanocobalamin ((VITAMIN B-12)) injection 1,000 mcg    No facility-administered encounter medications on file as of 04/04/2020.    Surgical History: History reviewed. No pertinent surgical history.  Medical History: History reviewed. No pertinent past medical history.  Family History: Family History  Problem Relation Age of Onset  . Neurofibromatosis Mother   . Anemia Mother   . Diabetes Other     Social History   Socioeconomic History  . Marital status: Single    Spouse name: Not on file  . Number of children: Not on file  . Years of education:  Not on file  . Highest education level: Not on file  Occupational History  . Not on file  Tobacco Use  . Smoking status: Former Smoker    Types: Cigarettes  . Smokeless tobacco: Never Used  Substance and Sexual Activity  . Alcohol use: Yes    Comment: ocassinally/ once a month   . Drug use: Never  . Sexual activity: Not on file  Other Topics Concern  . Not on file  Social History Narrative  . Not on file   Social Determinants of Health   Financial Resource Strain:   . Difficulty of Paying Living Expenses:   Food Insecurity:   . Worried About Charity fundraiser in the Last Year:   . Arboriculturist in the Last Year:   Transportation Needs:   . Film/video editor (Medical):   Marland Kitchen Lack of Transportation (Non-Medical):   Physical Activity:   . Days of Exercise per Week:   . Minutes of Exercise per Session:   Stress:   . Feeling of Stress :   Social Connections:   . Frequency of Communication with Friends and Family:   . Frequency of Social Gatherings with Friends and Family:   . Attends Religious Services:   . Active Member of Clubs or Organizations:   . Attends Archivist Meetings:   Marland Kitchen Marital Status:   Intimate Partner Violence:   . Fear of Current or Ex-Partner:   .  Emotionally Abused:   Marland Kitchen Physically Abused:   . Sexually Abused:       Review of Systems  Constitutional: Positive for fatigue. Negative for chills and unexpected weight change.       Three pound weight gain since her last visit   HENT: Negative for congestion, postnasal drip, rhinorrhea, sneezing and sore throat.   Respiratory: Negative for cough, chest tightness, shortness of breath and wheezing.   Cardiovascular: Negative for chest pain and palpitations.       Very cold lower legs and feet. Can be painful.   Gastrointestinal: Negative for abdominal pain, constipation, diarrhea, nausea and vomiting.  Endocrine: Negative for cold intolerance, heat intolerance, polydipsia and polyuria.   Musculoskeletal: Positive for arthralgias. Negative for back pain, joint swelling and neck pain.  Skin: Positive for color change. Negative for rash.       Blue or black discoloring to the lower extremities and feet, especially when the feet are cold.   Allergic/Immunologic: Negative for environmental allergies.  Neurological: Negative for dizziness, tremors, numbness and headaches.  Hematological: Negative for adenopathy. Does not bruise/bleed easily.  Psychiatric/Behavioral: Negative for behavioral problems (Depression), sleep disturbance and suicidal ideas. The patient is not nervous/anxious.     Today's Vitals   04/04/20 1551  BP: 119/80  Pulse: 95  Resp: 16  Temp: 98 F (36.7 C)  SpO2: 100%  Weight: 177 lb 12.8 oz (80.6 kg)  Height: 5\' 5"  (1.651 m)   Body mass index is 29.59 kg/m.  Physical Exam Vitals and nursing note reviewed.  Constitutional:      General: She is not in acute distress.    Appearance: Normal appearance. She is well-developed. She is not diaphoretic.  HENT:     Head: Normocephalic and atraumatic.     Nose: Nose normal.     Mouth/Throat:     Pharynx: No oropharyngeal exudate.  Eyes:     Pupils: Pupils are equal, round, and reactive to light.  Neck:     Thyroid: No thyromegaly.     Vascular: No JVD.     Trachea: No tracheal deviation.  Cardiovascular:     Rate and Rhythm: Normal rate and regular rhythm.     Heart sounds: Normal heart sounds. No murmur. No friction rub. No gallop.      Comments: Both feet and lower legs are cool to touch with delayed capillary refill. Per ABI results today, there is mildly decreased arterial circulation to the right lower leg.  Pulmonary:     Effort: Pulmonary effort is normal. No respiratory distress.     Breath sounds: Normal breath sounds. No wheezing or rales.  Chest:     Chest wall: No tenderness.  Abdominal:     Palpations: Abdomen is soft.  Musculoskeletal:        General: Normal range of motion.      Cervical back: Normal range of motion and neck supple.  Lymphadenopathy:     Cervical: No cervical adenopathy.  Skin:    General: Skin is warm and dry.     Capillary Refill: Capillary refill takes more than 3 seconds.  Neurological:     Mental Status: She is alert and oriented to person, place, and time.     Cranial Nerves: No cranial nerve deficit.  Psychiatric:        Mood and Affect: Mood normal.        Behavior: Behavior normal.        Thought Content: Thought content normal.  Judgment: Judgment normal.    Assessment/Plan: 1. Raynaud's phenomenon without gangrene Reviewed progress notes from initial visit with vein and vascular which confirms diagnosis. Will refer to new vein and vascular for second opinion.  - Ambulatory referral to Vascular Surgery  2. Prolonged capillary refill time Reviewed progress notes from initial visit with vein and vascular which confirms diagnosis. Will refer to new vein and vascular for second opinion.  - Ambulatory referral to Vascular Surgery  3. B12 deficiency Vitamin b12 injection administered in the office today.  - cyanocobalamin ((VITAMIN B-12)) injection 1,000 mcg  General Counseling: Shanen verbalizes understanding of the findings of todays visit and agrees with plan of treatment. I have discussed any further diagnostic evaluation that may be needed or ordered today. We also reviewed her medications today. she has been encouraged to call the office with any questions or concerns that should arise related to todays visit.  This patient was seen by Leretha Pol FNP Collaboration with Dr Lavera Guise as a part of collaborative care agreement  Orders Placed This Encounter  Procedures  . Ambulatory referral to Vascular Surgery    Meds ordered this encounter  Medications  . cyanocobalamin ((VITAMIN B-12)) injection 1,000 mcg    Total time spent: 30 Minutes   Time spent includes review of chart, medications, test results, and  follow up plan with the patient.      Dr Lavera Guise Internal medicine

## 2020-04-16 DIAGNOSIS — E538 Deficiency of other specified B group vitamins: Secondary | ICD-10-CM | POA: Insufficient documentation

## 2020-05-04 ENCOUNTER — Telehealth: Payer: Self-pay

## 2020-05-04 NOTE — Telephone Encounter (Signed)
Lmom to confirm and screen for 05-09-20 ov. 

## 2020-05-09 ENCOUNTER — Encounter: Payer: Self-pay | Admitting: Nurse Practitioner

## 2020-05-09 ENCOUNTER — Other Ambulatory Visit: Payer: Self-pay

## 2020-05-09 ENCOUNTER — Ambulatory Visit (INDEPENDENT_AMBULATORY_CARE_PROVIDER_SITE_OTHER): Payer: BC Managed Care – PPO | Admitting: Nurse Practitioner

## 2020-05-09 VITALS — BP 125/77 | HR 98 | Temp 97.6°F | Resp 16 | Ht 65.0 in | Wt 178.4 lb

## 2020-05-09 DIAGNOSIS — Z6829 Body mass index (BMI) 29.0-29.9, adult: Secondary | ICD-10-CM | POA: Diagnosis not present

## 2020-05-09 DIAGNOSIS — E538 Deficiency of other specified B group vitamins: Secondary | ICD-10-CM | POA: Diagnosis not present

## 2020-05-09 DIAGNOSIS — I7300 Raynaud's syndrome without gangrene: Secondary | ICD-10-CM

## 2020-05-09 MED ORDER — PHENTERMINE HCL 37.5 MG PO TABS: 37.5000 mg | ORAL_TABLET | Freq: Every day | ORAL | 0 refills | Status: DC

## 2020-05-09 MED ORDER — CYANOCOBALAMIN 1000 MCG/ML IJ SOLN
1000.0000 ug | Freq: Once | INTRAMUSCULAR | Status: AC
  Administered 2020-05-09: 1000 ug via INTRAMUSCULAR

## 2020-05-09 NOTE — Progress Notes (Signed)
[facility] [place], [place] [address]  Internal MEDICINE  Office Visit Note  Patient Name: Pura Nesbitt  [id number]  [id number]  Date of Service: 05/17/2020  Chief Complaint  Patient presents with  . Follow-up    weight management   . Bleeding/Bruising    bruising on legs  . Referral    vascular, never went through     The patient is here for follow up visit. She had been having frequently cold feet. When they get cold, they are painful. They will often turn blue or turn black. She has to use heating pad or two pairs of socks in order to get them warm and it does take a long time for them to get better. An ABI was done at her last visit, which was unremarkable.  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 Ultrasound of the arteries in both lower extremities indicate no evidence of stenosis. She continues to have significant fatigue and weight gain. Referral was made to [facility] vascular surgery for second opinion of Raynaud's phenomenon. It is unclear why this referral did not go through, but will be made again today as STAT referral as symptoms are getting worse.  She did start phentermine about 2 weeks ago. She has gained one pound, however, she is going to start her period tomorrow. She is due to have B12 injection today.       Current Medication: Outpatient Encounter Medications as of 05/09/2020  Medication Sig  . phentermine (ADIPEX-P) 37.5 MG tablet Take 1 tablet (37.5 mg total) by mouth daily before breakfast.  . [DISCONTINUED] phentermine (ADIPEX-P) 37.5 MG tablet Take 1 tablet (37.5 mg total) by mouth daily before breakfast.  . [EXPIRED] cyanocobalamin ((VITAMIN B-12)) injection 1,000 mcg    No facility-administered encounter medications on file as of 05/09/2020.    Surgical History: History reviewed. No pertinent surgical history.  Medical History: History reviewed. No  pertinent past medical history.  Family History: Family History  Problem Relation Age of Onset  . Neurofibromatosis Mother   . Anemia Mother   . Diabetes Other     Social History   Socioeconomic History  . Marital status: Single    Spouse name: Not on file  . Number of children: Not on file  . Years of education: Not on file  . Highest education level: Not on file  Occupational History  . Not on file  Tobacco Use  . Smoking status: Former Smoker    Types: Cigarettes  . Smokeless tobacco: Never Used  Substance and Sexual Activity  . Alcohol use: Yes    Comment: ocassinally/ once a month   . Drug use: Never  . Sexual activity: Not on file  Other Topics Concern  . Not on file  Social History Narrative  . Not on file   Social Determinants of Health   Financial Resource Strain:   . Difficulty of Paying Living Expenses:   Food Insecurity:   . Worried About Charity fundraiser in the Last Year:   . Arboriculturist in the Last Year:   Transportation Needs:   . Film/video editor (Medical):   Marland Kitchen Lack of Transportation (Non-Medical):   Physical Activity:   . Days of Exercise per Week:   . Minutes of Exercise per Session:   Stress:   . Feeling of Stress :   Social Connections:   . Frequency of Communication  with Friends and Family:   . Frequency of Social Gatherings with Friends and Family:   . Attends Religious Services:   . Active Member of Clubs or Organizations:   . Attends Archivist Meetings:   Marland Kitchen Marital Status:   Intimate Partner Violence:   . Fear of Current or Ex-Partner:   . Emotionally Abused:   Marland Kitchen Physically Abused:   . Sexually Abused:       Review of Systems  Constitutional: Positive for fatigue. Negative for chills and unexpected weight change.       One pound weight gain since her last visit.   HENT: Negative for congestion, postnasal drip, rhinorrhea, sneezing and sore throat.   Respiratory: Negative for cough, chest tightness,  shortness of breath and wheezing.   Cardiovascular: Negative for chest pain and palpitations.       Very cold lower legs and feet. Can be painful.  Getting worse.   Gastrointestinal: Negative for abdominal pain, constipation, diarrhea, nausea and vomiting.  Endocrine: Negative for cold intolerance, heat intolerance, polydipsia and polyuria.  Musculoskeletal: Positive for arthralgias. Negative for back pain, joint swelling and neck pain.  Skin: Positive for color change. Negative for rash.       Blue or black discoloring to the lower extremities and feet, especially when the feet are cold.   Allergic/Immunologic: Negative for environmental allergies.  Neurological: Negative for dizziness, tremors, numbness and headaches.  Hematological: Negative for adenopathy. Bruises/bleeds easily.  Psychiatric/Behavioral: Negative for behavioral problems (Depression), sleep disturbance and suicidal ideas. The patient is not nervous/anxious.     Today's Vitals   05/09/20 1523  BP: 125/77  Pulse: 98  Resp: 16  Temp: 97.6 F (36.4 C)  SpO2: 97%  Weight: 178 lb 6.4 oz (80.9 kg)  Height: 5\' 5"  (1.651 m)   Body mass index is 29.69 kg/m.  Physical Exam Vitals and nursing note reviewed.  Constitutional:      General: She is not in acute distress.    Appearance: Normal appearance. She is well-developed. She is not diaphoretic.  HENT:     Head: Normocephalic and atraumatic.     Nose: Nose normal.     Mouth/Throat:     Pharynx: No oropharyngeal exudate.  Eyes:     Pupils: Pupils are equal, round, and reactive to light.  Neck:     Thyroid: No thyromegaly.     Vascular: No JVD.     Trachea: No tracheal deviation.  Cardiovascular:     Rate and Rhythm: Normal rate and regular rhythm.     Heart sounds: Normal heart sounds. No murmur. No friction rub. No gallop.      Comments: Both feet and lower legs are cool to touch with delayed capillary refill. Per ABI results today, there is mildly decreased  arterial circulation to the right lower leg.  Pulmonary:     Effort: Pulmonary effort is normal. No respiratory distress.     Breath sounds: Normal breath sounds. No wheezing or rales.  Chest:     Chest wall: No tenderness.  Abdominal:     Palpations: Abdomen is soft.  Musculoskeletal:        General: Normal range of motion.     Cervical back: Normal range of motion and neck supple.  Lymphadenopathy:     Cervical: No cervical adenopathy.  Skin:    General: Skin is warm and dry.     Capillary Refill: Capillary refill takes more than 3 seconds.  Neurological:  Mental Status: She is alert and oriented to person, place, and time.     Cranial Nerves: No cranial nerve deficit.  Psychiatric:        Mood and Affect: Mood normal.        Behavior: Behavior normal.        Thought Content: Thought content normal.        Judgment: Judgment normal.    Assessment/Plan: 1. Raynaud's phenomenon without gangrene Redo referral to [facility] vein and vascular for further evaluation and possible treatment.  - Ambulatory referral to Vascular Surgery  2. B12 deficiency b12 injection administered today.  - cyanocobalamin ((VITAMIN B-12)) injection 1,000 mcg  3. BMI 29.0-29.9,adult Unclear if phentermine will be effective. conitnue for now. Limit calorie intake to 1200 calories per day. Incorporate exercise into daily routine.  - phentermine (ADIPEX-P) 37.5 MG tablet; Take 1 tablet (37.5 mg total) by mouth daily before breakfast.  Dispense: 30 tablet; Refill: 0  General Counseling: Damion verbalizes understanding of the findings of todays visit and agrees with plan of treatment. I have discussed any further diagnostic evaluation that may be needed or ordered today. We also reviewed her medications today. she has been encouraged to call the office with any questions or concerns that should arise related to todays visit.   There is a liability release in patients' chart. There has been a 10 minute  discussion about the side effects including but not limited to elevated blood pressure, anxiety, lack of sleep and dry mouth. Pt understands and will like to start/continue on appetite suppressant at this time. There will be one month RX given at the time of visit with proper follow up. Nova diet plan with restricted calories is given to the pt. Pt understands and agrees with  plan of treatment  This patient was seen by Leretha Pol FNP Collaboration with Dr Lavera Guise as a part of collaborative care agreement  Orders Placed This Encounter  Procedures  . Ambulatory referral to Vascular Surgery    Meds ordered this encounter  Medications  . cyanocobalamin ((VITAMIN B-12)) injection 1,000 mcg  . phentermine (ADIPEX-P) 37.5 MG tablet    Sig: Take 1 tablet (37.5 mg total) by mouth daily before breakfast.    Dispense:  30 tablet    Refill:  0    Order Specific Question:   Supervising Provider    Answer:   Lavera Guise [id number]    Total time spent: 30 Minutes  Time spent includes review of chart, medications, test results, and follow up plan with the patient.      Dr Lavera Guise Internal medicine

## 2020-06-06 ENCOUNTER — Emergency Department
Admission: EM | Admit: 2020-06-06 | Discharge: 2020-06-06 | Disposition: A | Discharge status: Home / Self Care | Payer: BC Managed Care – PPO | Attending: Emergency Medicine | Admitting: Emergency Medicine

## 2020-06-06 ENCOUNTER — Other Ambulatory Visit: Payer: Self-pay

## 2020-06-06 ENCOUNTER — Encounter: Payer: Self-pay | Admitting: Medical Oncology

## 2020-06-06 DIAGNOSIS — I7300 Raynaud's syndrome without gangrene: Secondary | ICD-10-CM | POA: Diagnosis not present

## 2020-06-06 DIAGNOSIS — R55 Syncope and collapse: Secondary | ICD-10-CM

## 2020-06-06 DIAGNOSIS — Z79899 Other long term (current) drug therapy: Secondary | ICD-10-CM | POA: Diagnosis not present

## 2020-06-06 DIAGNOSIS — Z87891 Personal history of nicotine dependence: Secondary | ICD-10-CM | POA: Insufficient documentation

## 2020-06-06 LAB — URINALYSIS, COMPLETE (UACMP) WITH MICROSCOPIC
Bilirubin Urine: NEGATIVE
Glucose, UA: NEGATIVE mg/dL
Hgb urine dipstick: NEGATIVE
Ketones, ur: NEGATIVE mg/dL
Leukocytes,Ua: NEGATIVE
Nitrite: NEGATIVE
Protein, ur: NEGATIVE mg/dL
Specific Gravity, Urine: 1.02 (ref 1.005–1.030)
pH: 5 (ref 5.0–8.0)

## 2020-06-06 LAB — BASIC METABOLIC PANEL
Anion gap: 7 (ref 5–15)
BUN: 13 mg/dL (ref 6–20)
CO2: 27 mmol/L (ref 22–32)
Calcium: 9.6 mg/dL (ref 8.9–10.3)
Chloride: 105 mmol/L (ref 98–111)
Creatinine, Ser: 0.85 mg/dL (ref 0.44–1.00)
GFR calc Af Amer: >60 mL/min (ref >60)
GFR calc non Af Amer: >60 mL/min (ref >60)
Glucose, Bld: 97 mg/dL (ref 70–99)
Potassium: 4.7 mmol/L (ref 3.5–5.1)
Sodium: 139 mmol/L (ref 135–145)

## 2020-06-06 LAB — CBC
HCT: 42.8 % (ref 36.0–46.0)
Hemoglobin: 14.1 g/dL (ref 12.0–15.0)
MCH: 28.7 pg (ref 26.0–34.0)
MCHC: 32.9 g/dL (ref 30.0–36.0)
MCV: 87 fL (ref 80.0–100.0)
Platelets: 235 10*3/uL (ref 150–400)
RBC: 4.92 MIL/uL (ref 3.87–5.11)
RDW: 12.4 % (ref 11.5–15.5)
WBC: 8.3 10*3/uL (ref 4.0–10.5)
nRBC: 0 % (ref 0.0–0.2)

## 2020-06-06 LAB — POCT PREGNANCY, URINE: Preg Test, Ur: NEGATIVE

## 2020-06-06 MED ORDER — ACETAMINOPHEN 500 MG PO TABS
1000.0000 mg | ORAL_TABLET | Freq: Once | ORAL | Status: AC
  Administered 2020-06-06: 1000 mg via ORAL
  Filled 2020-06-06: qty 2

## 2020-06-06 NOTE — ED Notes (Signed)
Pt reports having pain in legs shooting up into her body today while at work.  Pt states she felt numbness and tingling in her hands.  No chest pain or sob.  Pt reports having a headache and taking motrin without relief.  Pt alert speech clear. Pt ambulating without diff.

## 2020-06-06 NOTE — ED Provider Notes (Signed)
[facility] Emergency Department Provider Note   ____________________________________________   First MD Initiated Contact with Patient 06/06/20 2202     (approximate)  I have reviewed the triage vital signs and the nursing notes.   HISTORY  Chief Complaint Near Syncope    HPI Kathryn Estrada is a 24 y.o. female with past medical history of Raynaud's phenomenon who presents to the ED complaining of lightheadedness.  Patient reports she had an episode earlier today while getting up out of her car where she suddenly developed pain shooting up her left leg.  She then began to feel very lightheaded and like she might pass out, with black spots entering into her vision.  She did not have any chest pain or shortness of breath with this episode, now states that leg pain and lightheadedness have resolved but that she has a residual headache.  She describes this as a gradually worsening dull ache to both sides of her head, she denies any vision changes, speech changes, numbness, or weakness.  She has had issues with sharp pain in both feet in the past with discoloration of her skin, was diagnosed with Raynaud's phenomenon but does not take any medications for this.        History reviewed. No pertinent past medical history.  Patient Active Problem List   Diagnosis Date Noted  . B12 deficiency 04/16/2020  . Tobacco use disorder 03/28/2020  . Abnormal thyroid blood test 03/12/2020  . BMI 29.0-29.9,adult 03/12/2020  . Raynaud's phenomenon without gangrene 01/30/2020  . Prolonged capillary refill time 01/30/2020  . Bilateral cold feet 01/30/2020  . Other fatigue 01/30/2020  . Dysuria 01/30/2020    History reviewed. No pertinent surgical history.  Prior to Admission medications   Medication Sig Start Date End Date Taking? Authorizing Provider  phentermine (ADIPEX-P) 37.5 MG tablet Take 1 tablet (37.5 mg total) by mouth daily before breakfast. 05/09/20   Ronnell Freshwater, NP    Allergies Patient has no known allergies.  Family History  Problem Relation Age of Onset  . Neurofibromatosis Mother   . Anemia Mother   . Diabetes Other     Social History Social History   Tobacco Use  . Smoking status: Former Smoker    Types: Cigarettes  . Smokeless tobacco: Never Used  Vaping Use  . Vaping Use: Every day  . Devices: vape   Substance Use Topics  . Alcohol use: Yes    Comment: ocassinally/ once a month   . Drug use: Never    Review of Systems  Constitutional: No fever/chills.  Positive for lightheadedness and near syncope. Eyes: No visual changes. ENT: No sore throat. Cardiovascular: Denies chest pain. Respiratory: Denies shortness of breath. Gastrointestinal: No abdominal pain.  No nausea, no vomiting.  No diarrhea.  No constipation.  Genitourinary: Negative for dysuria. Musculoskeletal: Negative for back pain.  Positive for leg pain. Skin: Negative for rash. Neurological: Negative for headaches, focal weakness or numbness.  ____________________________________________   PHYSICAL EXAM:  VITAL SIGNS: ED Triage Vitals  Enc Vitals Group     BP 06/06/20 1640 117/83     Pulse Rate 06/06/20 1640 86     Resp 06/06/20 1640 18     Temp 06/06/20 1640 98.6 F (37 C)     Temp Source 06/06/20 1640 Oral     SpO2 06/06/20 1640 100 %     Weight 06/06/20 1709 165 lb (74.8 kg)     Height 06/06/20 1709 5\' 5"  (1.651  m)     Head Circumference --      Peak Flow --      Pain Score 06/06/20 1709 7     Pain Loc --      Pain Edu? --      Excl. in [place]? --     Constitutional: Alert and oriented. Eyes: Conjunctivae are normal. Head: Atraumatic. Nose: No congestion/rhinnorhea. Mouth/Throat: Mucous membranes are moist. Neck: Normal ROM Cardiovascular: Normal rate, regular rhythm. Grossly normal heart sounds. Respiratory: Normal respiratory effort.  No retractions. Lungs CTAB. Gastrointestinal: Soft and nontender. No  distention. Genitourinary: deferred Musculoskeletal: No lower extremity tenderness nor edema. Neurologic:  Normal speech and language. No gross focal neurologic deficits are appreciated. Skin:  Skin is warm, dry and intact. No rash noted. Psychiatric: Mood and affect are normal. Speech and behavior are normal.  ____________________________________________   LABS (all labs ordered are listed, but only abnormal results are displayed)  Labs Reviewed  URINALYSIS, COMPLETE (UACMP) WITH MICROSCOPIC - Abnormal; Notable for the following components:      Result Value   Color, Urine YELLOW (*)    APPearance CLEAR (*)    Bacteria, UA RARE (*)    All other components within normal limits  BASIC METABOLIC PANEL  CBC  POC URINE PREG, ED  POCT PREGNANCY, URINE   ____________________________________________  EKG  ED ECG REPORT I, Blake Divine, the attending physician, personally viewed and interpreted this ECG.   Date: 06/06/2020  EKG Time: 16:49  Rate: 81  Rhythm: normal sinus rhythm  Axis: Normal  Intervals:none  ST&T Change: None   PROCEDURES  Procedure(s) performed (including Critical Care):  Procedures   ____________________________________________   INITIAL IMPRESSION / ASSESSMENT AND PLAN / ED COURSE       24 year old female with possible history of Raynaud's phenomenon presents to the ED complaining of acute onset left lower extremity pain associated with lightheadedness and near syncope.  Extremity pain and lightheadedness have both resolved, although patient complains of some ongoing headache.  Her lower extremities are warm and well perfused with 2+ DP pulses bilaterally.  She does have a picture on her phone of her feet from earlier that appears consistent with Raynaud's phenomenon.  No calf tenderness or swelling to suggest DVT.  EKG shows normal sinus rhythm and patient remains in normal sinus rhythm on cardiac monitor.  I suspect there was a vasovagal episode  contributing to her lightheadedness and near syncope.  Pregnancy testing is negative, basic labs and UA are unremarkable.  Her headache sounds most consistent with tension headache and I doubt intracranial process, no indication for head CT.  She is appropriate for discharge home and counseled to follow-up with her PCP, otherwise return to the ED for new or worsening symptoms.  Patient agrees with plan.      ____________________________________________   FINAL CLINICAL IMPRESSION(S) / ED DIAGNOSES  Final diagnoses:  Near syncope  Raynaud's phenomenon without gangrene     ED Discharge Orders    None       Note:  This document was prepared using Dragon voice recognition software and may include unintentional dictation errors.   Blake Divine, MD 06/06/20 2328

## 2020-06-06 NOTE — ED Notes (Signed)
md in with pt now.  

## 2020-06-06 NOTE — ED Notes (Signed)
No answer when called several times from lobby 

## 2020-06-06 NOTE — ED Triage Notes (Signed)
Pt reports just pta she was at work when she had a sudden pain in her legs that "shot up" through her body and into her head. Pt states that she then felt hot all over and just felt "off". Pt reports now she continues to have a head ache and pain to the back of her head and neck.

## 2020-06-06 NOTE — ED Notes (Signed)
Rainbow was sent to lab. 

## 2020-06-12 ENCOUNTER — Ambulatory Visit (INDEPENDENT_AMBULATORY_CARE_PROVIDER_SITE_OTHER): Payer: BC Managed Care – PPO | Admitting: Obstetrics and Gynecology

## 2020-06-12 ENCOUNTER — Encounter: Payer: Self-pay | Admitting: Obstetrics and Gynecology

## 2020-06-12 ENCOUNTER — Other Ambulatory Visit: Payer: Self-pay

## 2020-06-12 ENCOUNTER — Other Ambulatory Visit (HOSPITAL_COMMUNITY)
Admission: RE | Admit: 2020-06-12 | Discharge: 2020-06-12 | Disposition: A | Discharge status: Home / Self Care | Payer: BC Managed Care – PPO | Source: Ambulatory Visit | Attending: Obstetrics and Gynecology | Admitting: Obstetrics and Gynecology

## 2020-06-12 VITALS — BP 120/72 | HR 98 | Resp 16 | Ht 65.0 in | Wt 179.2 lb

## 2020-06-12 DIAGNOSIS — Z01419 Encounter for gynecological examination (general) (routine) without abnormal findings: Secondary | ICD-10-CM

## 2020-06-12 DIAGNOSIS — Z0000 Encounter for general adult medical examination without abnormal findings: Secondary | ICD-10-CM | POA: Diagnosis not present

## 2020-06-12 DIAGNOSIS — N946 Dysmenorrhea, unspecified: Secondary | ICD-10-CM

## 2020-06-12 DIAGNOSIS — Z124 Encounter for screening for malignant neoplasm of cervix: Secondary | ICD-10-CM | POA: Insufficient documentation

## 2020-06-12 DIAGNOSIS — Z113 Encounter for screening for infections with a predominantly sexual mode of transmission: Secondary | ICD-10-CM | POA: Insufficient documentation

## 2020-06-12 MED ORDER — NAPROXEN 500 MG PO TABS: 500.0000 mg | ORAL_TABLET | Freq: Two times a day (BID) | ORAL | 2 refills | Status: DC

## 2020-06-12 NOTE — Patient Instructions (Signed)
Institute of Medicine Recommended Dietary Allowances for Calcium and Vitamin D  Age (yr) Calcium Recommended Dietary Allowance (mg/day) Vitamin D Recommended Dietary Allowance (international units/day)  9-18 1,300 600  19-50 1,000 600  51-70 1,200 600  71 and older 1,200 800  Data from Institute of Medicine. Dietary reference intakes: calcium, vitamin D. [place], [place]: National Academies Press; 2011.    

## 2020-06-12 NOTE — Progress Notes (Signed)
Gynecology Annual Exam  PCP: Ronnell Freshwater, NP  Chief Complaint:  Chief Complaint  Patient presents with  . Gynecologic Exam    pelvis pain    History of Present Illness: Patient is a 24 y.o. No obstetric history on file. presents for annual exam. The patient has no complaints today.   LMP: Patient's last menstrual period was 06/08/2020. Menarche:11 Average Interval: regular, 28 days Duration of flow: 7 days Heavy Menses: no Clots: no Intermenstrual Bleeding: no Postcoital Bleeding: no Dysmenorrhea: yes. Reports severe pain with  Periods. She reports taking 600 mg of Motrin every 3 hours to control her pain.  She reports that she sets a timer at night to take medicine throughout the night because of her pain.  She reports that if she does not do that she is unable to function at work because of the severe pain during her menstrual cycle.  She feels like her menstrual cycle pain is potentially more than that of other people.  The patient is sexually active. She currently uses none for contraception. She reports she has some discomfort depending on position but generally not significant or severe pain with intercourse.  She reports that her and her partner are trying to conceive.  She reports that she has been trying to conceive for 3 months.  She has not yet taking a prenatal vitamin.  She reports that she and her partner have been timing intercourse for every other day during her for fertile window.   There is no notable family history of breast or ovarian cancer in her family.  The patient denies current symptoms of depression.    Review of Systems: Review of Systems  Constitutional: Negative for chills, fever, malaise/fatigue and weight loss.  HENT: Negative for congestion, hearing loss and sinus pain.   Eyes: Negative for blurred vision and double vision.  Respiratory: Negative for cough, sputum production, shortness of breath and wheezing.   Cardiovascular: Negative  for chest pain, palpitations, orthopnea and leg swelling.  Gastrointestinal: Negative for abdominal pain, constipation, diarrhea, nausea and vomiting.  Genitourinary: Negative for dysuria, flank pain, frequency, hematuria and urgency.  Musculoskeletal: Negative for back pain, falls and joint pain.  Skin: Negative for itching and rash.  Neurological: Negative for dizziness and headaches.  Psychiatric/Behavioral: Negative for depression, substance abuse and suicidal ideas. The patient is not nervous/anxious.     Past Medical History:  Patient Active Problem List   Diagnosis Date Noted  . B12 deficiency 04/16/2020  . Tobacco use disorder 03/28/2020  . Abnormal thyroid blood test 03/12/2020  . BMI 29.0-29.9,adult 03/12/2020  . Raynaud's phenomenon without gangrene 01/30/2020  . Prolonged capillary refill time 01/30/2020  . Bilateral cold feet 01/30/2020  . Other fatigue 01/30/2020  . Dysuria 01/30/2020    Past Surgical History:  History reviewed. No pertinent surgical history.  Gynecologic History:  Patient's last menstrual period was 06/08/2020. Contraception: none Last Pap: Results were: never  Obstetric History: No obstetric history on file.  Family History:  Family History  Problem Relation Age of Onset  . Neurofibromatosis Mother   . Anemia Mother   . Diabetes Other     Social History:  Social History   Socioeconomic History  . Marital status: Single    Spouse name: Not on file  . Number of children: Not on file  . Years of education: Not on file  . Highest education level: Not on file  Occupational History  . Not on file  Tobacco  Use  . Smoking status: Former Smoker    Types: Cigarettes  . Smokeless tobacco: Never Used  Vaping Use  . Vaping Use: Every day  . Devices: vape   Substance and Sexual Activity  . Alcohol use: Yes    Comment: ocassinally/ once a month   . Drug use: Never  . Sexual activity: Not on file  Other Topics Concern  . Not on file   Social History Narrative  . Not on file   Social Determinants of Health   Financial Resource Strain:   . Difficulty of Paying Living Expenses:   Food Insecurity:   . Worried About Charity fundraiser in the Last Year:   . Arboriculturist in the Last Year:   Transportation Needs:   . Film/video editor (Medical):   Marland Kitchen Lack of Transportation (Non-Medical):   Physical Activity:   . Days of Exercise per Week:   . Minutes of Exercise per Session:   Stress:   . Feeling of Stress :   Social Connections:   . Frequency of Communication with Friends and Family:   . Frequency of Social Gatherings with Friends and Family:   . Attends Religious Services:   . Active Member of Clubs or Organizations:   . Attends Archivist Meetings:   Marland Kitchen Marital Status:   Intimate Partner Violence:   . Fear of Current or Ex-Partner:   . Emotionally Abused:   Marland Kitchen Physically Abused:   . Sexually Abused:     Allergies:  No Known Allergies  Medications: Prior to Admission medications   Medication Sig Start Date End Date Taking? Authorizing Provider  phentermine (ADIPEX-P) 37.5 MG tablet Take 1 tablet (37.5 mg total) by mouth daily before breakfast. 05/09/20  Yes Boscia, Heather E, NP  naproxen (NAPROSYN) 500 MG tablet Take 1 tablet (500 mg total) by mouth 2 (two) times daily with a meal. As needed for pain 06/12/20   Homero Fellers, MD    Physical Exam Vitals: Blood pressure 120/72, pulse 98, resp. rate 16, height 5\' 5"  (1.651 m), weight 179 lb 3.2 oz (81.3 kg), last menstrual period 06/08/2020, SpO2 98 %.  General: NAD HEENT: normocephalic, anicteric Thyroid: no enlargement, no palpable nodules Pulmonary: No increased work of breathing, CTAB Cardiovascular: RRR, distal pulses 2+ Breast: Breast symmetrical, no tenderness, no palpable nodules or masses, no skin or nipple retraction present, no nipple discharge.  No axillary or supraclavicular lymphadenopathy. Abdomen: NABS, soft,  non-tender, non-distended.  Umbilicus without lesions.  No hepatomegaly, splenomegaly or masses palpable. No evidence of hernia  Genitourinary:  External: Normal external female genitalia.  Normal urethral meatus, normal Bartholin's and Skene's glands.    Vagina: Normal vaginal mucosa, no evidence of prolapse.    Cervix: Grossly normal in appearance, no bleeding  Uterus: Non-enlarged, mobile, normal contour.  No CMT  Adnexa: ovaries non-enlarged, no adnexal masses  Rectal: deferred  Lymphatic: no evidence of inguinal lymphadenopathy Extremities: no edema, erythema, or tenderness Neurologic: Grossly intact Psychiatric: mood appropriate, affect full  Female chaperone present for pelvic and breast  portions of the physical exam    Assessment: 24 y.o. No obstetric history on file. routine annual exam  Plan: Problem List Items Addressed This Visit    None    Visit Diagnoses    Encounter for annual routine gynecological examination    -  Primary   Health maintenance examination       Cervical cancer screening  Relevant Orders   Cytology - PAP   Screen for STD (sexually transmitted disease)       Relevant Orders   Cytology - PAP   Dysmenorrhea       Relevant Medications   naproxen (NAPROSYN) 500 MG tablet      1) STI screening  was offered and accepted  2)  ASCCP guidelines and rational discussed.  Patient opts for every 3 years screening interval  3) Contraception - the patient is currently using  none.  She is attempting to conceive in the near future. We discussed safe sex practices to reduce her furture risk of STI's.    4) Dysmenorrhea- discussed methods of helping control pain during menses. Discussed evaluation for endometriosis. Patient not interested at this time but can consider for the future. Rx for naproxen sent. Cautioned against excessive.   5) Return in about 1 year (around 06/12/2021), or if symptoms worsen or fail to improve.  Adrian Prows MD,  [facility], [facility] 06/12/2020 4:18 PM

## 2020-06-14 ENCOUNTER — Telehealth (INDEPENDENT_AMBULATORY_CARE_PROVIDER_SITE_OTHER): Payer: Self-pay | Admitting: Vascular Surgery

## 2020-06-14 NOTE — Telephone Encounter (Signed)
Please see NP note

## 2020-06-14 NOTE — Telephone Encounter (Signed)
I called the pt and  she said that the pain started getting worse last night she has a cramping and throbbing pain in her left leg that is constant. She has no Hx of blood clots the leg is warm to the touch and she is having  coming and going numbness, with no difficulty moving the leg. The pt was seen here last on 4/6 by dr. Lucky Cowboy who said she has Reynauds diease and should stop smoking. Dr. Lucky Cowboy said sh could consider  using calcium channel blockers or nitropaste to relieve some of the vasospasm. The pt was offered a follow up in in several months vs calling if symptoms got worse she chose the latter.

## 2020-06-14 NOTE — Telephone Encounter (Signed)
Called stating she is worried about her daughter Kathryn Estrada. [name] had an episode last Friday where her feet turned purple and shooting pains through her left leg. They went to the ED, provider said that she had a flare up of raynauds. Yesterday her mom said that her leg (left) had a spider web affect. Patient was last seen as a new patient with [facility] 03-28-20. Mother would like for her to come in to be seen. Please advise.

## 2020-06-14 NOTE — Telephone Encounter (Signed)
Bring her in with ABIs (with toe PPGS) and she can see me or Dew

## 2020-06-15 ENCOUNTER — Ambulatory Visit (INDEPENDENT_AMBULATORY_CARE_PROVIDER_SITE_OTHER): Payer: BC Managed Care – PPO | Admitting: Nurse Practitioner

## 2020-06-15 ENCOUNTER — Encounter (INDEPENDENT_AMBULATORY_CARE_PROVIDER_SITE_OTHER): Payer: Self-pay | Admitting: Nurse Practitioner

## 2020-06-15 ENCOUNTER — Ambulatory Visit (INDEPENDENT_AMBULATORY_CARE_PROVIDER_SITE_OTHER): Payer: BC Managed Care – PPO

## 2020-06-15 ENCOUNTER — Other Ambulatory Visit (INDEPENDENT_AMBULATORY_CARE_PROVIDER_SITE_OTHER): Payer: Self-pay | Admitting: Nurse Practitioner

## 2020-06-15 ENCOUNTER — Other Ambulatory Visit: Payer: Self-pay

## 2020-06-15 VITALS — BP 127/88 | HR 91 | Resp 18 | Ht 65.0 in | Wt 174.0 lb

## 2020-06-15 DIAGNOSIS — R252 Cramp and spasm: Secondary | ICD-10-CM

## 2020-06-15 DIAGNOSIS — I7300 Raynaud's syndrome without gangrene: Secondary | ICD-10-CM

## 2020-06-15 DIAGNOSIS — M79605 Pain in left leg: Secondary | ICD-10-CM

## 2020-06-15 DIAGNOSIS — L959 Vasculitis limited to the skin, unspecified: Secondary | ICD-10-CM | POA: Diagnosis not present

## 2020-06-15 DIAGNOSIS — R209 Unspecified disturbances of skin sensation: Secondary | ICD-10-CM

## 2020-06-15 DIAGNOSIS — F17200 Nicotine dependence, unspecified, uncomplicated: Secondary | ICD-10-CM

## 2020-06-15 MED ORDER — CLOPIDOGREL BISULFATE 75 MG PO TABS
75.0000 mg | ORAL_TABLET | Freq: Every day | ORAL | 1 refills | Status: DC
Start: 2020-06-15 — End: 2020-08-29

## 2020-06-15 MED ORDER — AMLODIPINE BESYLATE 5 MG PO TABS
5.0000 mg | ORAL_TABLET | Freq: Every day | ORAL | 1 refills | Status: DC
Start: 2020-06-15 — End: 2020-08-29

## 2020-06-16 ENCOUNTER — Ambulatory Visit (INDEPENDENT_AMBULATORY_CARE_PROVIDER_SITE_OTHER): Payer: BC Managed Care – PPO | Admitting: Nurse Practitioner

## 2020-06-16 LAB — CYTOLOGY - PAP
Chlamydia: NEGATIVE
Comment: NEGATIVE
Comment: NEGATIVE
Comment: NORMAL
Diagnosis: NEGATIVE
Neisseria Gonorrhea: NEGATIVE
Trichomonas: NEGATIVE

## 2020-06-19 ENCOUNTER — Encounter (INDEPENDENT_AMBULATORY_CARE_PROVIDER_SITE_OTHER): Payer: Self-pay | Admitting: Nurse Practitioner

## 2020-06-19 NOTE — Progress Notes (Signed)
Subjective:    Patient ID: Kathryn Estrada, female    DOB: May 14, 1996, 24 y.o.   MRN: [id number] Chief Complaint  Patient presents with  . Follow-up    ultrasound    Kathryn Estrada is a 24 y.o. female.  The patient was initially referred by her PCP for Raynaud's disease.  Initially when she was referred the patient symptoms had worsened over the last 4 to 5 months however it has been ongoing for years.  The patient has cold, purple feet all the time.  She has intermittent episodes where her foot becomes white and very painful.  Outside of the episodes where her feet become discolored she does not have pain normally.  However, recently the patient was getting into her car and had sudden severe ankle pain that radiated up her leg.  During this time she began to have discoloration in her thigh which appears to be some mottling.  Since that time the patient has had pain in her ankle and feet area.  The patient also had dizziness and was thought to have a vasovagal event causing syncope.  The patient is currently a smoker she also utilizes phentermine.  She does note that her feet are affected equally.  She also notices that the discoloration as well as discomfort is not variable with the temperature changes.  It is constant.  Again, she notes that this is becoming more consistently painful.  She is also concerned about the discoloration on her left thigh area.  The patient also notes that she does have a family history of autoimmune diseases with her paternal grandmother having vasculitis in her mother currently undergoing work-up for lupus and other autoimmune diseases.  She denies any fever, chills, nausea, vomiting or diarrhea during these episodes.  She denies any chest pain or shortness of breath.  She denies actually losing consciousness.  Today the patient underwent noninvasive studies which revealed an ABI of 1.13 on the right and an ABI of 1.19 on the left.  The patient has strong triphasic tibial  artery waveforms until the digits are reached.  The patient has severely dampened right first and second digit waveforms with flat waveforms throughout the right lower extremity.  The left lower extremity has nearly flat waveforms throughout with severely dampened first digit.   Review of Systems  Musculoskeletal: Positive for arthralgias.  Skin: Positive for color change.  Neurological: Positive for dizziness and light-headedness.  Hematological: Bruises/bleeds easily.  All other systems reviewed and are negative.      Objective:   Physical Exam Vitals reviewed.  HENT:     Head: Normocephalic.  Cardiovascular:     Rate and Rhythm: Normal rate and regular rhythm.     Pulses: Decreased pulses.  Pulmonary:     Effort: Pulmonary effort is normal.     Breath sounds: Normal breath sounds.  Musculoskeletal:        General: Tenderness present.  Skin:    Capillary Refill: Capillary refill takes more than 3 seconds.  Neurological:     Mental Status: She is alert and oriented to person, place, and time.  Psychiatric:        Mood and Affect: Mood normal.        Behavior: Behavior normal.        Thought Content: Thought content normal.        Judgment: Judgment normal.     BP 127/88 (BP Location: Right Arm)   Pulse 91   Resp 18   Ht  5\' 5"  (1.651 m)   Wt 174 lb (78.9 kg)   LMP 06/08/2020   BMI 28.96 kg/m   History reviewed. No pertinent past medical history.  Social History   Socioeconomic History  . Marital status: Single    Spouse name: Not on file  . Number of children: Not on file  . Years of education: Not on file  . Highest education level: Not on file  Occupational History  . Not on file  Tobacco Use  . Smoking status: Current Every Day Smoker    Types: Cigarettes, E-cigarettes  . Smokeless tobacco: Never Used  Vaping Use  . Vaping Use: Every day  . Devices: vape   Substance and Sexual Activity  . Alcohol use: Yes    Comment: ocassinally/ once a month   .  Drug use: Never  . Sexual activity: Not on file  Other Topics Concern  . Not on file  Social History Narrative  . Not on file   Social Determinants of Health   Financial Resource Strain:   . Difficulty of Paying Living Expenses:   Food Insecurity:   . Worried About Charity fundraiser in the Last Year:   . Arboriculturist in the Last Year:   Transportation Needs:   . Film/video editor (Medical):   Marland Kitchen Lack of Transportation (Non-Medical):   Physical Activity:   . Days of Exercise per Week:   . Minutes of Exercise per Session:   Stress:   . Feeling of Stress :   Social Connections:   . Frequency of Communication with Friends and Family:   . Frequency of Social Gatherings with Friends and Family:   . Attends Religious Services:   . Active Member of Clubs or Organizations:   . Attends Archivist Meetings:   Marland Kitchen Marital Status:   Intimate Partner Violence:   . Fear of Current or Ex-Partner:   . Emotionally Abused:   Marland Kitchen Physically Abused:   . Sexually Abused:     History reviewed. No pertinent surgical history.  Family History  Problem Relation Age of Onset  . Neurofibromatosis Mother   . Anemia Mother   . Diabetes Other     No Known Allergies     Assessment & Plan:   1. Vasculitis of skin Due to the unusual nature of the patient's presentation that extends up her leg, there is a concern that this may be related to vasculitis or some other autoimmune disease.  We will refer the patient to rheumatology for work-up.  The patient also has a strong genetic component which also increases the likelihood that this is a possibility.  Patient will continue to follow with rheumatology for work-up of possible autoimmune/vasculitis. - Ambulatory referral to Rheumatology  2. Raynaud's phenomenon without gangrene Initially it was felt that the patient's condition may be related to Raynaud's disease.  However, most people with Raynaud's disease have issues when there are  rapid fluctuations in ambient temperature.  Also, Raynaud's phenomenon is typically transient whereas this seems to be consistent.  The patient's most recent exacerbation happened when it was 80 to 90 degree weather outside.  Despite the warm weather the patient has had continued pain and discomfort.  We will begin the patient on a slow dose of amlodipine to see if this will help some of her vasospasms.  3. Tobacco use disorder The patient is advised that smoking cessation would be a good idea in either the case of Raynaud's, vasculitis  or possible microvascular disease.  Continued smoking causes vasoconstriction which will worsen any of these conditions.  4. Bilateral cold feet Patient does have bilateral cold lower extremities.  Based upon the noninvasive studies today it seems more likely that this may be related to microvascular condition.  We will also place the patient on Plavix to see if this also improves the patient's studies.  Patient is also advised to stop taking phentermine as this is a stimulant and could also cause vasoconstructive activities.  We will have the patient follow-up in 6 weeks to determine if Plavix has been held for the patient's lower extremity discoloration.   Current Outpatient Medications on File Prior to Visit  Medication Sig Dispense Refill  . naproxen (NAPROSYN) 500 MG tablet Take 1 tablet (500 mg total) by mouth 2 (two) times daily with a meal. As needed for pain 60 tablet 2  . phentermine (ADIPEX-P) 37.5 MG tablet Take 1 tablet (37.5 mg total) by mouth daily before breakfast. 30 tablet 0   No current facility-administered medications on file prior to visit.    There are no Patient Instructions on file for this visit. No follow-ups on file.   Kris Hartmann, NP

## 2020-06-20 ENCOUNTER — Ambulatory Visit: Payer: BC Managed Care – PPO | Admitting: Nurse Practitioner

## 2020-07-27 ENCOUNTER — Encounter (INDEPENDENT_AMBULATORY_CARE_PROVIDER_SITE_OTHER): Payer: BC Managed Care – PPO

## 2020-07-27 ENCOUNTER — Ambulatory Visit (INDEPENDENT_AMBULATORY_CARE_PROVIDER_SITE_OTHER): Payer: BC Managed Care – PPO | Admitting: Nurse Practitioner

## 2020-08-29 ENCOUNTER — Other Ambulatory Visit (HOSPITAL_COMMUNITY)
Admission: RE | Admit: 2020-08-29 | Discharge: 2020-08-29 | Disposition: A | Discharge status: Home / Self Care | Payer: BC Managed Care – PPO | Source: Ambulatory Visit | Attending: Obstetrics | Admitting: Obstetrics

## 2020-08-29 ENCOUNTER — Encounter: Payer: Self-pay | Admitting: Obstetrics

## 2020-08-29 ENCOUNTER — Ambulatory Visit (INDEPENDENT_AMBULATORY_CARE_PROVIDER_SITE_OTHER): Payer: BC Managed Care – PPO | Admitting: Obstetrics

## 2020-08-29 ENCOUNTER — Other Ambulatory Visit: Payer: Self-pay

## 2020-08-29 VITALS — BP 115/70 | Ht 65.0 in | Wt 181.4 lb

## 2020-08-29 DIAGNOSIS — Z3480 Encounter for supervision of other normal pregnancy, unspecified trimester: Secondary | ICD-10-CM | POA: Diagnosis present

## 2020-08-29 DIAGNOSIS — O219 Vomiting of pregnancy, unspecified: Secondary | ICD-10-CM

## 2020-08-29 DIAGNOSIS — Z3A08 8 weeks gestation of pregnancy: Secondary | ICD-10-CM

## 2020-08-29 LAB — POC URINE PREG, ED: Preg Test, Ur: POSITIVE — AB

## 2020-08-29 MED ORDER — ONDANSETRON 4 MG PO TBDP: 4.0000 mg | ORAL_TABLET | Freq: Four times a day (QID) | ORAL | 0 refills | Status: DC | PRN

## 2020-08-29 NOTE — Progress Notes (Signed)
New Obstetric Patient H&P    Chief Complaint: "Desires prenatal care"   History of Present Illness: Patient is a 24 y.o. G1P0 Not Hispanic or [place] female, LMP 06/04/2020 presents with amenorrhea and positive home pregnancy test. Based on her  LMP, her EDD is Estimated Date of Delivery: 04/10/21 and her EGA is [redacted]w[redacted]d. Cycles are 5. days, regular, and occur approximately every : 28 days. Her last pap smear was 0 years ago and was no abnormalities.    She had a urine pregnancy test which was positive about 4 week(s)  ago. Her last menstrual period was normal and lasted for  about 5 day(s). Since her LMP she claims she has experienced daily nausea, slight fatigue and some breast tenderness. She denies vaginal bleeding. Her past medical history is noncontributory. Her prior pregnancies are notable for none  Since her LMP, she admits to the use of tobacco products  Yes, she vapes, and is trying to quit, but admits to this being rather difficult. She is motivated. She claims she has gained   no pounds since the start of her pregnancy.  There are cats in the home in the home  yes If yes Indoor and she does not empty the litter box. She admits close contact with children on a regular basis  no  She has had chicken pox in the past unknown She has had Tuberculosis exposures, symptoms, or previously tested positive for TB   no Current or past history of domestic violence. no  Genetic Screening/Teratology Counseling: (Includes patient, baby's father, or anyone in either family with:)   70. Patient's age >/= 29 at [facility]  no 2. Thalassemia ([place], [place], [place], or Asian background): MCV<80  no 3. Neural tube defect (meningomyelocele, spina bifida, anencephaly)  no 4. Congenital heart defect  no  5. Down syndrome  no 6. Tay-Sachs (Jewish, [place])  no 7. Canavan's Disease  no 8. Sickle cell disease or trait (African)  no  9. Hemophilia or other blood disorders  no  10. Muscular  dystrophy  no  11. Cystic fibrosis  no  12. Huntington's Chorea  no  13. Mental retardation/autism  no 14. Other inherited genetic or chromosomal disorder  Yes-her mother and other siblings have Neurofibrometosis 58. Maternal metabolic disorder (DM, PKU, etc)  no 16. Patient or FOB with a child with a birth defect not listed above no  16a. Patient or FOB with a birth defect themselves no 17. Recurrent pregnancy loss, or stillbirth  no  18. Any medications since LMP other than prenatal vitamins (include vitamins, supplements, OTC meds, drugs, alcohol)  no 19. Any other genetic/environmental exposure to discuss  no  Infection History:   1. Lives with someone with TB or TB exposed  no  2. Patient or partner has history of genital herpes  no 3. Rash or viral illness since LMP  no 4. History of STI (GC, CT, HPV, syphilis, HIV)  no 5. History of recent travel :  no  Other pertinent information:  Other family members have Neurofibrometosis     Review of Systems:10 point review of systems negative unless otherwise noted in HPI  Past Medical History:  No past medical history on file.  Past Surgical History:  No past surgical history on file.  Gynecologic History: Patient's last menstrual period was 07/04/2020.  Obstetric History: G1P0  Family History:  Family History  Problem Relation Age of Onset  . Neurofibromatosis Mother   . Anemia  Mother   . Diabetes Other     Social History:  Social History   Socioeconomic History  . Marital status: Single    Spouse name: Not on file  . Number of children: Not on file  . Years of education: Not on file  . Highest education level: Not on file  Occupational History  . Not on file  Tobacco Use  . Smoking status: Current Every Day Smoker    Types: Cigarettes, E-cigarettes  . Smokeless tobacco: Never Used  Vaping Use  . Vaping Use: Every day  . Devices: vape   Substance and Sexual Activity  . Alcohol use: Yes    Comment:  ocassinally/ once a month   . Drug use: Never  . Sexual activity: Not on file  Other Topics Concern  . Not on file  Social History Narrative  . Not on file   Social Determinants of Health   Financial Resource Strain:   . Difficulty of Paying Living Expenses: Not on file  Food Insecurity:   . Worried About Charity fundraiser in the Last Year: Not on file  . Ran Out of Food in the Last Year: Not on file  Transportation Needs:   . Lack of Transportation (Medical): Not on file  . Lack of Transportation (Non-Medical): Not on file  Physical Activity:   . Days of Exercise per Week: Not on file  . Minutes of Exercise per Session: Not on file  Stress:   . Feeling of Stress : Not on file  Social Connections:   . Frequency of Communication with Friends and Family: Not on file  . Frequency of Social Gatherings with Friends and Family: Not on file  . Attends Religious Services: Not on file  . Active Member of Clubs or Organizations: Not on file  . Attends Archivist Meetings: Not on file  . Marital Status: Not on file  Intimate Partner Violence:   . Fear of Current or Ex-Partner: Not on file  . Emotionally Abused: Not on file  . Physically Abused: Not on file  . Sexually Abused: Not on file    Allergies:  No Known Allergies  Medications: Prior to Admission medications   Not on File    Physical Exam Vitals: Blood pressure 115/70, height 5\' 5"  (1.651 m), weight 181 lb 6.4 oz (82.3 kg), last menstrual period 07/04/2020.  General: NAD HEENT: normocephalic, anicteric Thyroid: no enlargement, no palpable nodules Pulmonary: No increased work of breathing, CTAB Cardiovascular: RRR, distal pulses 2+ Abdomen: NABS, soft, non-tender, non-distended.  Umbilicus without lesions.  No hepatomegaly, splenomegaly or masses palpable. No evidence of hernia  Genitourinary:  External: Normal external female genitalia.  Normal urethral meatus, normal  Bartholin's and Skene's glands.     Vagina: Normal vaginal mucosa, no evidence of prolapse.    Cervix: Grossly normal in appearance, no bleeding  Uterus: anteverted, slightly-enlarged, mobile, normal contour.  No CMT  Adnexa: ovaries non-enlarged, no adnexal masses  Rectal: deferred Extremities: no edema, erythema, or tenderness Neurologic: Grossly intact Psychiatric: mood appropriate, affect full   Assessment: 24 y.o. G1P0 at [redacted]w[redacted]d presenting to initiate prenatal care   Plan: 1) Avoid alcoholic beverages. 2) Patient encouraged not to smoke.  3) Discontinue the use of all non-medicinal drugs and chemicals.  4) Take prenatal vitamins daily.  5) Nutrition, food safety (fish, cheese advisories, and high nitrite foods) and exercise discussed. 6) Hospital and practice style discussed with cross coverage system.  7) Genetic Screening, such as  with 1st Trimester Screening, cell free fetal DNA, AFP testing, and Ultrasound, as well as with amniocentesis and CVS as appropriate, is discussed with patient. At the conclusion of today's visit patient undecided genetic testing 8) Patient is asked about travel to areas at risk for the [place] virus, and counseled to avoid travel and exposure to mosquitoes or sexual partners who may have themselves been exposed to the virus. Testing is discussed, and will be ordered as appropriate.  Lactation and the benefits of breastfeeding were covered with her. She is encouraged to breastfeed her baby. No pregnancy checklist tasks were completed during this visit, and no tasks are pending completion.  Pap smear already done this Summer. The GC, CT cultures sent. She will have the Inheritest, and is considering the MaternT testing. RTC in 2 weeks for dating ultrasound and labwork. Imagene Riches, CNM  08/29/2020 5:42 PM

## 2020-08-29 NOTE — Addendum Note (Signed)
Addended by: Imagene Riches on: 08/29/2020 05:45 PM   Modules accepted: Orders

## 2020-08-31 LAB — CERVICOVAGINAL ANCILLARY ONLY
Bacterial Vaginitis (gardnerella): NEGATIVE
Candida Glabrata: NEGATIVE
Candida Vaginitis: NEGATIVE
Chlamydia: NEGATIVE
Comment: NEGATIVE
Comment: NEGATIVE
Comment: NEGATIVE
Comment: NEGATIVE
Comment: NEGATIVE
Comment: NORMAL
Neisseria Gonorrhea: NEGATIVE
Trichomonas: NEGATIVE

## 2020-09-04 ENCOUNTER — Other Ambulatory Visit: Payer: Self-pay | Admitting: Obstetrics

## 2020-09-04 DIAGNOSIS — Z3A08 8 weeks gestation of pregnancy: Secondary | ICD-10-CM

## 2020-09-04 DIAGNOSIS — O219 Vomiting of pregnancy, unspecified: Secondary | ICD-10-CM

## 2020-09-04 DIAGNOSIS — Z3480 Encounter for supervision of other normal pregnancy, unspecified trimester: Secondary | ICD-10-CM

## 2020-09-04 NOTE — Telephone Encounter (Signed)
Pt calling; needs information on rx.  [phone]  [facility] or leave detailed on nurse line.

## 2020-09-05 ENCOUNTER — Other Ambulatory Visit: Payer: Self-pay | Admitting: Obstetrics

## 2020-09-05 DIAGNOSIS — Z3A08 8 weeks gestation of pregnancy: Secondary | ICD-10-CM

## 2020-09-05 DIAGNOSIS — O219 Vomiting of pregnancy, unspecified: Secondary | ICD-10-CM

## 2020-09-05 DIAGNOSIS — Z3480 Encounter for supervision of other normal pregnancy, unspecified trimester: Secondary | ICD-10-CM

## 2020-09-05 MED ORDER — ONDANSETRON 4 MG PO TBDP: 4.0000 mg | ORAL_TABLET | Freq: Four times a day (QID) | ORAL | 0 refills | Status: DC | PRN

## 2020-09-06 NOTE — Telephone Encounter (Signed)
Left second msg

## 2020-09-08 ENCOUNTER — Telehealth: Payer: Self-pay

## 2020-09-08 NOTE — Telephone Encounter (Signed)
Pt's mom, Jenny Reichmann, called after hour nurse 09/08/20 8:12am; states pt hasn't had a BM in 12 days; 10wks preg; has tried stool softener and a suppository with minimal relief; pt is miserable and is having pain with sitting.  Was adv to be seen within 24hrs; pt doesn't want to see MD for this; would like to take care of it at home; After hour nurse adv per guidelines that she can attempt a one time fleet enema and begin taking miralax daily until she notices her stool becoming soft; mom states pt will try the enema today and if no relief she will see the MD.  [phone]  Pt states she is at work right now and will try the enema this afternoon.  Adv prunes/prune juice/heated prune juice.  Pt is also dealing c nausea and no able to eat much.

## 2020-09-14 ENCOUNTER — Other Ambulatory Visit: Payer: Self-pay

## 2020-09-14 ENCOUNTER — Encounter: Payer: Self-pay | Admitting: Obstetrics & Gynecology

## 2020-09-14 ENCOUNTER — Ambulatory Visit (INDEPENDENT_AMBULATORY_CARE_PROVIDER_SITE_OTHER): Payer: BC Managed Care – PPO | Admitting: Obstetrics & Gynecology

## 2020-09-14 ENCOUNTER — Ambulatory Visit (INDEPENDENT_AMBULATORY_CARE_PROVIDER_SITE_OTHER): Payer: BC Managed Care – PPO

## 2020-09-14 DIAGNOSIS — Z3A08 8 weeks gestation of pregnancy: Secondary | ICD-10-CM

## 2020-09-14 DIAGNOSIS — Z3480 Encounter for supervision of other normal pregnancy, unspecified trimester: Secondary | ICD-10-CM | POA: Diagnosis not present

## 2020-09-14 DIAGNOSIS — Z3A09 9 weeks gestation of pregnancy: Secondary | ICD-10-CM

## 2020-09-14 MED ORDER — ONDANSETRON 4 MG PO TBDP
4.0000 mg | ORAL_TABLET | Freq: Four times a day (QID) | ORAL | 3 refills | Status: DC | PRN
Start: 2020-09-14 — End: 2020-12-06

## 2020-09-14 MED ORDER — DOXYLAMINE-PYRIDOXINE 10-10 MG PO TBEC: 2.0000 | DELAYED_RELEASE_TABLET | Freq: Every day | ORAL | 5 refills | Status: DC

## 2020-09-14 NOTE — Patient Instructions (Signed)

## 2020-09-14 NOTE — Progress Notes (Signed)
  Subjective  Fetal Movement? no Nause- YES, Zofran helps Leaking Fluid? no Vaginal Bleeding? no  Objective  BP 120/80   Wt 181 lb (82.1 kg)   LMP 07/04/2020   BMI 30.12 kg/m  General: NAD Pumonary: no increased work of breathing Abdomen: gravid, non-tender Extremities: no edema Psychiatric: mood appropriate, affect full  Assessment  24 y.o. G1P0 at [redacted]w[redacted]d by  04/10/2021, by Last Menstrual Period presenting for routine prenatal visit  Plan   Problem List Items Addressed This Visit      Other   Supervision of other normal pregnancy, antepartum    Other Visit Diagnoses    [redacted] weeks gestation of pregnancy           Review of ULTRASOUND.    I have personally reviewed images and report of recent ultrasound done at [facility].    Plan of management to be discussed with pt  PNV, Labs today  Barnett Applebaum, MD, [facility], [facility] 09/14/2020  4:25 PM

## 2020-09-15 LAB — RPR+RH+ABO+RUB AB+AB SCR+CB...
Antibody Screen: NEGATIVE
HIV Screen 4th Generation wRfx: NONREACTIVE
Hematocrit: 43.3 % (ref 34.0–46.6)
Hemoglobin: 14.2 g/dL (ref 11.1–15.9)
Hepatitis B Surface Ag: NEGATIVE
MCH: 28.8 pg (ref 26.6–33.0)
MCHC: 32.8 g/dL (ref 31.5–35.7)
MCV: 88 fL (ref 79–97)
Platelets: 242 10*3/uL (ref 150–450)
RBC: 4.93 x10E6/uL (ref 3.77–5.28)
RDW: 11.9 % (ref 11.7–15.4)
RPR Ser Ql: NONREACTIVE
Rh Factor: POSITIVE
Rubella Antibodies, IGG: 3.17 index (ref >0.99)
Varicella zoster IgG: 1054 index (ref >165)
WBC: 13 10*3/uL — ABNORMAL HIGH (ref 3.4–10.8)

## 2020-10-09 ENCOUNTER — Ambulatory Visit (INDEPENDENT_AMBULATORY_CARE_PROVIDER_SITE_OTHER): Payer: BC Managed Care – PPO | Admitting: Obstetrics

## 2020-10-09 ENCOUNTER — Other Ambulatory Visit: Payer: Self-pay

## 2020-10-09 VITALS — BP 120/70 | Wt 181.0 lb

## 2020-10-09 DIAGNOSIS — Z3A15 15 weeks gestation of pregnancy: Secondary | ICD-10-CM

## 2020-10-09 DIAGNOSIS — Z3480 Encounter for supervision of other normal pregnancy, unspecified trimester: Secondary | ICD-10-CM

## 2020-10-09 LAB — POCT URINALYSIS DIPSTICK OB
Glucose, UA: NEGATIVE
POC,PROTEIN,UA: NEGATIVE

## 2020-10-09 NOTE — Progress Notes (Signed)
  Routine Prenatal Care Visit  Subjective  Kathryn Estrada is a 24 y.o. G1P0 at [redacted]w[redacted]d being seen today for ongoing prenatal care.  She is currently monitored for the following issues for this low-risk pregnancy and has Raynaud's phenomenon without gangrene; Prolonged capillary refill time; Bilateral cold feet; Other fatigue; Dysuria; Abnormal thyroid blood test; BMI 29.0-29.9,adult; Tobacco use disorder; B12 deficiency; and Supervision of other normal pregnancy, antepartum on their problem list.  ----------------------------------------------------------------------------------- Patient reports no complaints.    .  .   Kathryn Estrada Fluid denies.  ----------------------------------------------------------------------------------- The following portions of the patient's history were reviewed and updated as appropriate: allergies, current medications, past family history, past medical history, past social history, past surgical history and problem list. Problem list updated.  Objective  Blood pressure 120/70, weight 181 lb (82.1 kg), last menstrual period 07/04/2020. Pregravid weight Pregravid weight not on file Total Weight Gain Not found. Urinalysis: Urine Protein    Urine Glucose    Fetal Status:           General:  Alert, oriented and cooperative. Patient is in no acute distress.  Skin: Skin is warm and dry. No rash noted.   Cardiovascular: Normal heart rate noted  Respiratory: Normal respiratory effort, no problems with respiration noted  Abdomen: Soft, gravid, appropriate for gestational age.       Pelvic:  Cervical exam deferred        Extremities: Normal range of motion.     Mental Status: Normal mood and affect. Normal behavior. Normal judgment and thought content.   Assessment   24 y.o. G1P0 at [redacted]w[redacted]d by  04/10/2021, by Last Menstrual Period presenting for routine prenatal visit  Plan   pregnancy1 Problems (from 08/29/20 to present)    No problems associated with this episode.         Preterm labor symptoms and general obstetric precautions including but not limited to vaginal bleeding, contractions, leaking of fluid and fetal movement were reviewed in detail with the patient. Please refer to After Visit Summary for other counseling recommendations.  Anatomy scan next visit  Return in about 4 weeks (around 11/06/2020) for return OB.  Kathryn Estrada, CNM  10/09/2020 9:06 AM

## 2020-10-09 NOTE — Addendum Note (Signed)
Addended by: Cleophas Dunker D on: 10/09/2020 09:46 AM   Modules accepted: Orders

## 2020-10-09 NOTE — Progress Notes (Signed)
C/O hurt right foot two weekends ago; it is swollen; went to Urgent Care who told her she needed to go to the ED for u/s but she didn't want to go and wait; is taking tylenol, icing, and elevating.rj

## 2020-10-18 ENCOUNTER — Telehealth: Payer: Self-pay

## 2020-10-18 NOTE — Telephone Encounter (Signed)
Pt calling; having an issue with her teeth; states needs okay from [place] for tx.  [phone]  I called to get name and fax number of her dentist; she states they cannot get her in until 2d before Christmas and she can't wait that long; should she just call us back when she gets another dentist.  Adv 'yes' and to get their name and fax number and we will bas dental protocol.

## 2020-11-06 ENCOUNTER — Ambulatory Visit: Payer: BC Managed Care – PPO

## 2020-11-06 ENCOUNTER — Encounter: Payer: BC Managed Care – PPO | Admitting: Obstetrics

## 2020-11-21 ENCOUNTER — Ambulatory Visit (INDEPENDENT_AMBULATORY_CARE_PROVIDER_SITE_OTHER): Payer: BC Managed Care – PPO | Admitting: Obstetrics

## 2020-11-21 ENCOUNTER — Ambulatory Visit (INDEPENDENT_AMBULATORY_CARE_PROVIDER_SITE_OTHER): Payer: BC Managed Care – PPO

## 2020-11-21 ENCOUNTER — Other Ambulatory Visit: Payer: Self-pay

## 2020-11-21 VITALS — BP 120/70 | Ht 65.0 in | Wt 188.6 lb

## 2020-11-21 DIAGNOSIS — Z3A19 19 weeks gestation of pregnancy: Secondary | ICD-10-CM | POA: Diagnosis not present

## 2020-11-21 DIAGNOSIS — Z3480 Encounter for supervision of other normal pregnancy, unspecified trimester: Secondary | ICD-10-CM | POA: Diagnosis not present

## 2020-11-21 DIAGNOSIS — Z3A15 15 weeks gestation of pregnancy: Secondary | ICD-10-CM

## 2020-11-21 DIAGNOSIS — Z3A20 20 weeks gestation of pregnancy: Secondary | ICD-10-CM

## 2020-11-21 NOTE — Progress Notes (Signed)
  Routine Prenatal Care Visit  Subjective  Kathryn Estrada is a 24 y.o. G1P0 at [redacted]w[redacted]d being seen today for ongoing prenatal care.  She is currently monitored for the following issues for this low-risk pregnancy and has Raynaud's phenomenon without gangrene; Prolonged capillary refill time; Bilateral cold feet; Other fatigue; Dysuria; Abnormal thyroid blood test; BMI 29.0-29.9,adult; Tobacco use disorder; B12 deficiency; and Supervision of other normal pregnancy, antepartum on their problem list.  ----------------------------------------------------------------------------------- Patient reports no complaints.   Contractions: Not present. Vag. Bleeding: None.  Movement: Present. Leaking Fluid denies.  ----------------------------------------------------------------------------------- The following portions of the patient's history were reviewed and updated as appropriate: allergies, current medications, past family history, past medical history, past social history, past surgical history and problem list. Problem list updated.  Objective  Blood pressure 120/70, height 5\' 5"  (1.651 m), weight 188 lb 9.6 oz (85.5 kg), last menstrual period 07/04/2020. Pregravid weight Pregravid weight not on file Total Weight Gain Not found. Urinalysis: Urine Protein    Urine Glucose    Fetal Status:     Movement: Present     General:  Alert, oriented and cooperative. Patient is in no acute distress.  Skin: Skin is warm and dry. No rash noted.   Cardiovascular: Normal heart rate noted  Respiratory: Normal respiratory effort, no problems with respiration noted  Abdomen: Soft, gravid, appropriate for gestational age. Pain/Pressure: Absent     Pelvic:  Cervical exam deferred        Extremities: Normal range of motion.     Mental Status: Normal mood and affect. Normal behavior. Normal judgment and thought content.   Assessment   24 y.o. G1P0 at [redacted]w[redacted]d by  04/10/2021, by Last Menstrual Period presenting for routine  prenatal visit  Plan   pregnancy1 Problems (from 08/29/20 to present)    No problems associated with this episode.       Preterm labor symptoms and general obstetric precautions including but not limited to vaginal bleeding, contractions, leaking of fluid and fetal movement were reviewed in detail with the patient. Please refer to After Visit Summary for other counseling recommendations.   Return in about 2 weeks (around 12/05/2020) for return OB repeat anatomy scan.Imagene Riches, CNM  11/21/2020 3:44 PM

## 2020-11-29 ENCOUNTER — Telehealth: Payer: Self-pay

## 2020-11-29 NOTE — Telephone Encounter (Signed)
Pt's mom, Jenny Reichmann, calling; pt is 21wks; having nosebleeds; this is the 7th one in past few days.  What to do?  [phone]  Pt states this last one lasted 43min.  Adv per protocol to apply ice pack; during nosebleed to put head back and pinch nostrals to get it to stop; get a humidifier.  Pt asked if this is normal.  Adv it was.

## 2020-12-01 NOTE — Telephone Encounter (Signed)
I called Kathryn Estrada's mother and advised her to apply both pressure and ice pack to her nose with nose bleeds. We discussed the option of a referral to an ENT. They are willing to wait till her next appointment to address this, but I encouraged her to contact us if she continues with this frequency of bleeds.I am hoping her next appt is with one of our docs. Her other option is to go to Urgent care or the ED, but she is aware that they may wait for hours. If you can see that she sees one of the Mds for her next visit, great!

## 2020-12-01 NOTE — Telephone Encounter (Signed)
Pt's mom, Jenny Reichmann, calling again; pt had three nose bleeds yesterday; so far today she has had one and also has a blood blister inside her mouth on her cheek; this is the 10th day in a row she's had nose bleeds; they are more frequent.  Is this much normal?  Has appt Wed.  [phone] (pt's number),

## 2020-12-04 NOTE — Telephone Encounter (Signed)
Unable to leave message about provider change for appointment on 12/06/20.

## 2020-12-06 ENCOUNTER — Encounter: Payer: BC Managed Care – PPO | Admitting: Obstetrics

## 2020-12-06 ENCOUNTER — Ambulatory Visit (INDEPENDENT_AMBULATORY_CARE_PROVIDER_SITE_OTHER): Payer: BC Managed Care – PPO

## 2020-12-06 ENCOUNTER — Other Ambulatory Visit: Payer: Self-pay

## 2020-12-06 ENCOUNTER — Other Ambulatory Visit: Payer: Self-pay | Admitting: Obstetrics and Gynecology

## 2020-12-06 ENCOUNTER — Ambulatory Visit (INDEPENDENT_AMBULATORY_CARE_PROVIDER_SITE_OTHER): Payer: BC Managed Care – PPO | Admitting: Obstetrics and Gynecology

## 2020-12-06 ENCOUNTER — Encounter: Payer: Self-pay | Admitting: Obstetrics and Gynecology

## 2020-12-06 VITALS — BP 118/70 | Ht 65.0 in | Wt 192.2 lb

## 2020-12-06 DIAGNOSIS — Z362 Encounter for other antenatal screening follow-up: Secondary | ICD-10-CM

## 2020-12-06 DIAGNOSIS — Z3A22 22 weeks gestation of pregnancy: Secondary | ICD-10-CM

## 2020-12-06 DIAGNOSIS — Z3480 Encounter for supervision of other normal pregnancy, unspecified trimester: Secondary | ICD-10-CM

## 2020-12-06 DIAGNOSIS — O219 Vomiting of pregnancy, unspecified: Secondary | ICD-10-CM

## 2020-12-06 DIAGNOSIS — R040 Epistaxis: Secondary | ICD-10-CM

## 2020-12-06 LAB — POCT URINALYSIS DIPSTICK OB
Glucose, UA: NEGATIVE
POC,PROTEIN,UA: NEGATIVE

## 2020-12-06 MED ORDER — ONDANSETRON 4 MG PO TBDP: 4.0000 mg | ORAL_TABLET | Freq: Four times a day (QID) | ORAL | 2 refills | Status: DC | PRN

## 2020-12-06 NOTE — Progress Notes (Signed)
Pt has her [place] redone.

## 2020-12-06 NOTE — Patient Instructions (Signed)
Nosebleed, Adult A nosebleed is when blood comes out of the nose. Nosebleeds are common. Usually, they are not a sign of a serious condition. Nosebleeds can happen if a small blood vessel in your nose starts to bleed or if the lining of your nose (mucous membrane) cracks. They are commonly caused by:  Allergies.  Colds.  Picking your nose.  Blowing your nose too hard.  An injury from sticking an object into your nose or getting hit in the nose.  Dry or cold air. Less common causes of nosebleeds include:  Toxic fumes.  Something abnormal in the nose or in the air-filled spaces in the bones of the face (sinuses).  Growths in the nose, such as polyps.  Medicines or conditions that cause blood to clot slowly.  Certain illnesses or procedures that irritate or dry out the nasal passages. Follow these instructions at home: When you have a nosebleed:   Sit down and tilt your head slightly forward.  Use a clean towel or tissue to pinch your nostrils under the bony part of your nose. After 10 minutes, let go of your nose and see if bleeding starts again. Do not release pressure before that time. If there is still bleeding, repeat the pinching and holding for 10 minutes until the bleeding stops.  Do not place tissues or gauze in the nose to stop bleeding.  Avoid lying down and avoid tilting your head backward. That may make blood collect in the throat and cause gagging or coughing.  Use a nasal spray decongestant to help with a nosebleed as told by your health care provider.  Do not use petroleum jelly or mineral oil in your nose. It can drip into your lungs. After a nosebleed:  Avoid blowing your nose or sniffing for a number of hours.  Avoid straining, lifting, or bending at the waist for several days. You may resume other normal activities as you are able.  Use saline spray or a humidifier as told by your health care provider.  Aspirinand blood thinners make bleeding more  likely. If you are prescribed these medicines and you suffer from nosebleeds: ? Ask your health care provider if you should stop taking the medicines or if you should adjust the dose. ? Do not stop taking medicines that your health care provider has recommended unless told by your health care provider.  If your nosebleed was caused by dry mucous membranes, use over-the-counter saline nasal spray or gel. This will keep the mucous membranes moist and allow them to heal. If you must use a lubricant: ? Choose one that is water-soluble. ? Use only as much as you need and use it only as often as needed. ? Do not lie down until several hours after you use it. Contact a health care provider if:  You have a fever.  You get nosebleeds often or more often than usual.  You bruise very easily.  You have a nosebleed from having something stuck in your nose.  You have bleeding in your mouth.  You vomit or cough up brown material.  You have a nosebleed after you start a new medicine. Get help right away if:  You have a nosebleed after a fall or a head injury.  Your nosebleed does not go away after 20 minutes.  You feel dizzy or weak.  You have unusual bleeding from other parts of your body.  You have unusual bruising on other parts of your body.  You become sweaty.  You   vomit blood. This information is not intended to replace advice given to you by your health care provider. Make sure you discuss any questions you have with your health care provider. Document Revised: 03/10/2018 Document Reviewed: 06/25/2016 Elsevier Patient Education  2020 [employer]  

## 2020-12-06 NOTE — Progress Notes (Signed)
Routine Prenatal Care Visit  Subjective  Kathryn Estrada is a 24 y.o. G1P0 at [redacted]w[redacted]d being seen today for ongoing prenatal care.  She is currently monitored for the following issues for this low-risk pregnancy and has Raynaud's phenomenon without gangrene; Prolonged capillary refill time; Bilateral cold feet; Other fatigue; Dysuria; Abnormal thyroid blood test; BMI 29.0-29.9,adult; Tobacco use disorder; B12 deficiency; and Supervision of other normal pregnancy, antepartum on their problem list.  ----------------------------------------------------------------------------------- Patient reports that she has been having recurrent nose bleeds. She reports that they have been severe and occuring multiple times a day. She has never had issues with nosebleeds outside of pregnancy.   Contractions: Not present. Vag. Bleeding: None.  Movement: Present. Denies leaking of fluid.  ----------------------------------------------------------------------------------- The following portions of the patient's history were reviewed and updated as appropriate: allergies, current medications, past family history, past medical history, past social history, past surgical history and problem list. Problem list updated.   Objective  Blood pressure 118/70, height 5\' 5"  (1.651 m), weight 192 lb 3.2 oz (87.2 kg), last menstrual period 07/04/2020. Pregravid weight Pregravid weight not on file Total Weight Gain Not found. Urinalysis:      Fetal Status: Fetal Heart Rate (bpm): 164 Fundal Height: 22 cm Movement: Present     General:  Alert, oriented and cooperative. Patient is in no acute distress.  Skin: Skin is warm and dry. No rash noted.   Cardiovascular: Normal heart rate noted  Respiratory: Normal respiratory effort, no problems with respiration noted  Abdomen: Soft, gravid, appropriate for gestational age. Pain/Pressure: Absent     Pelvic:  Cervical exam deferred        Extremities: Normal range of motion.  Edema:  None  Mental Status: Normal mood and affect. Normal behavior. Normal judgment and thought content.     Assessment   24 y.o. G1P0 at [redacted]w[redacted]d by  04/10/2021, by Last Menstrual Period presenting for routine prenatal visit  Plan   pregnancy1 Problems (from 08/29/20 to present)    Problem Noted Resolved   Supervision of other normal pregnancy, antepartum 08/29/2020 by Imagene Riches, CNM No   Overview Addendum 12/06/2020  4:40 PM by Homero Fellers, MD      Nursing Staff Provider  Office Location  [facility] Dating   LMP  Language  English Anatomy US  complete  Flu Vaccine  Declines Genetic Screen  Declines   TDaP vaccine    Hgb A1C or  GTT Third trimester :   Rhogam  Not needed   LAB RESULTS   Feeding Plan  Breast Blood Type O/Positive/-- (09/23 1628)   Contraception  Antibody Negative (09/23 1628)  Circumcision  Rubella 3.17 (09/23 1628)  Pediatrician   RPR Non Reactive (09/23 1628)   Support Person  HBsAg Negative (09/23 1628)   Prenatal Classes  Encouraged HIV Non Reactive (09/23 1628)    Varicella  Immune  BTL Consent  GBS  (For PCN allergy, check sensitivities)        VBAC Consent  Pap  2021 NIL    Hgb Electro    Covid  unvaccinated CF      SMA                Previous Version     Will check CBC to access for any low plts.  Will refer to ENT for recurrent nose bleeds. Declines flu vaccination Encouraged COVID vaccination Encouraged prenatal and breastfeeding classes  Gestational age appropriate obstetric precautions including but not limited to  vaginal bleeding, contractions, leaking of fluid and fetal movement were reviewed in detail with the patient.    Return in about 3 weeks (around 12/27/2020) for ROB in person.  Homero Fellers MD [facility], [facility] 12/06/2020, 4:47 PM

## 2020-12-07 LAB — CBC
Hematocrit: 34.9 % (ref 34.0–46.6)
Hemoglobin: 11.9 g/dL (ref 11.1–15.9)
MCH: 29.5 pg (ref 26.6–33.0)
MCHC: 34.1 g/dL (ref 31.5–35.7)
MCV: 87 fL (ref 79–97)
Platelets: 205 10*3/uL (ref 150–450)
RBC: 4.03 x10E6/uL (ref 3.77–5.28)
RDW: 12.8 % (ref 11.7–15.4)
WBC: 12.3 10*3/uL — ABNORMAL HIGH (ref 3.4–10.8)

## 2020-12-08 NOTE — Telephone Encounter (Signed)
Pt saw CRS.  Msg closed.

## 2020-12-27 ENCOUNTER — Ambulatory Visit (INDEPENDENT_AMBULATORY_CARE_PROVIDER_SITE_OTHER): Payer: Medicaid Other | Admitting: Obstetrics

## 2020-12-27 ENCOUNTER — Other Ambulatory Visit: Payer: Self-pay

## 2020-12-27 VITALS — BP 120/80 | Wt 198.6 lb

## 2020-12-27 DIAGNOSIS — Z3480 Encounter for supervision of other normal pregnancy, unspecified trimester: Secondary | ICD-10-CM

## 2020-12-27 DIAGNOSIS — Z3A25 25 weeks gestation of pregnancy: Secondary | ICD-10-CM

## 2020-12-27 NOTE — Progress Notes (Signed)
Routine Prenatal Care Visit  Subjective  Kathryn Estrada is a 25 y.o. G1P0 at [redacted]w[redacted]d being seen today for ongoing prenatal care.  She is currently monitored for the following issues for this low-risk pregnancy and has Raynaud's phenomenon without gangrene; Prolonged capillary refill time; Bilateral cold feet; Other fatigue; Dysuria; Abnormal thyroid blood test; BMI 29.0-29.9,adult; Tobacco use disorder; B12 deficiency; and Supervision of other normal pregnancy, antepartum on their problem list.  ----------------------------------------------------------------------------------- Patient reports no complaints.  She did not go to the ENT as her insurance is not covering. Her nosebleeds have greatly diminished. Contractions: Not present. Vag. Bleeding: None.  Movement: Present. Leaking Fluid denies.  ----------------------------------------------------------------------------------- The following portions of the patient's history were reviewed and updated as appropriate: allergies, current medications, past family history, past medical history, past social history, past surgical history and problem list. Problem list updated.  Objective  Blood pressure 120/80, weight 198 lb 9.6 oz (90.1 kg), last menstrual period 07/04/2020. Pregravid weight Pregravid weight not on file Total Weight Gain Not found. Urinalysis: Urine Protein    Urine Glucose    Fetal Status:     Movement: Present     General:  Alert, oriented and cooperative. Patient is in no acute distress.  Skin: Skin is warm and dry. No rash noted.   Cardiovascular: Normal heart rate noted  Respiratory: Normal respiratory effort, no problems with respiration noted  Abdomen: Soft, gravid, appropriate for gestational age. Pain/Pressure: Absent     Pelvic:  Cervical exam deferred        Extremities: Normal range of motion.     Mental Status: Normal mood and affect. Normal behavior. Normal judgment and thought content.   Assessment   25 y.o.  G1P0 at [redacted]w[redacted]d by  04/10/2021, by Last Menstrual Period presenting for routine prenatal visit  Plan   pregnancy1 Problems (from 08/29/20 to present)    Problem Noted Resolved   Supervision of other normal pregnancy, antepartum 08/29/2020 by Mirna Mires, CNM No   Overview Addendum 12/06/2020  4:40 PM by Natale Milch, MD      Nursing Staff Provider  Office Location  [facility] Dating   LMP  Language  English Anatomy US  complete  Flu Vaccine  Declines Genetic Screen  Declines   TDaP vaccine    Hgb A1C or  GTT Third trimester :   Rhogam  Not needed   LAB RESULTS   Feeding Plan  Breast Blood Type O/Positive/-- (09/23 1628)   Contraception  Antibody Negative (09/23 1628)  Circumcision  Rubella 3.17 (09/23 1628)  Pediatrician   RPR Non Reactive (09/23 1628)   Support Person  HBsAg Negative (09/23 1628)   Prenatal Classes  Encouraged HIV Non Reactive (09/23 1628)    Varicella  Immune  BTL Consent  GBS  (For PCN allergy, check sensitivities)        VBAC Consent  Pap  2021 NIL    Hgb Electro    Covid  unvaccinated CF      SMA                Previous Version       Preterm labor symptoms and general obstetric precautions including but not limited to vaginal bleeding, contractions, leaking of fluid and fetal movement were reviewed in detail with the patient. Please refer to After Visit Summary for other counseling recommendations.  We discussed the lab work next visit.  Return in about 3 weeks (around 01/17/2021) for return OB, 28 wk labs.  Imagene Riches, CNM  12/27/2020 2:39 PM

## 2020-12-27 NOTE — Progress Notes (Signed)
ROB 25w

## 2020-12-28 ENCOUNTER — Encounter: Payer: BC Managed Care – PPO | Admitting: Obstetrics

## 2021-01-17 ENCOUNTER — Other Ambulatory Visit: Payer: Medicaid Other

## 2021-01-17 ENCOUNTER — Encounter: Payer: Medicaid Other | Admitting: Obstetrics and Gynecology

## 2021-01-17 ENCOUNTER — Ambulatory Visit (INDEPENDENT_AMBULATORY_CARE_PROVIDER_SITE_OTHER): Payer: Medicaid Other | Admitting: Obstetrics

## 2021-01-17 ENCOUNTER — Other Ambulatory Visit: Payer: Self-pay

## 2021-01-17 VITALS — BP 120/80 | Wt 201.0 lb

## 2021-01-17 DIAGNOSIS — Z3480 Encounter for supervision of other normal pregnancy, unspecified trimester: Secondary | ICD-10-CM

## 2021-01-17 DIAGNOSIS — Z3A28 28 weeks gestation of pregnancy: Secondary | ICD-10-CM

## 2021-01-17 DIAGNOSIS — Z3A25 25 weeks gestation of pregnancy: Secondary | ICD-10-CM

## 2021-01-17 LAB — POCT URINALYSIS DIPSTICK OB
Glucose, UA: NEGATIVE
POC,PROTEIN,UA: NEGATIVE

## 2021-01-17 NOTE — Progress Notes (Signed)
Routine Prenatal Care Visit  Subjective  Kathryn Estrada is a 25 y.o. G1P0 at [redacted]w[redacted]d being seen today for ongoing prenatal care.  She is currently monitored for the following issues for this low-risk pregnancy and has Raynaud's phenomenon without gangrene; Prolonged capillary refill time; Bilateral cold feet; Other fatigue; Dysuria; Abnormal thyroid blood test; BMI 29.0-29.9,adult; Tobacco use disorder; B12 deficiency; and Supervision of other normal pregnancy, antepartum on their problem list.  ----------------------------------------------------------------------------------- Patient reports no bleeding, no contractions, no leaking and and she feels that the baby's movements are less. During our vivist she reported ample fetal movement which is flet by the CNM and the patient..   Contractions: Not present. Vag. Bleeding: None.  Movement: (!) Decreased type of meovements, although baby is moving well today.. Leaking Fluid denies.  ----------------------------------------------------------------------------------- The following portions of the patient's history were reviewed and updated as appropriate: allergies, current medications, past family history, past medical history, past social history, past surgical history and problem list. Problem list updated.  Objective  Blood pressure 120/80, weight 201 lb (91.2 kg), last menstrual period 07/04/2020. Pregravid weight Pregravid weight not on file Total Weight Gain Not found. Urinalysis: Urine Protein    Urine Glucose    Fetal Status:     Movement: (!) Decreased     General:  Alert, oriented and cooperative. Patient is in no acute distress.  Skin: Skin is warm and dry. No rash noted.   Cardiovascular: Normal heart rate noted  Respiratory: Normal respiratory effort, no problems with respiration noted  Abdomen: Soft, gravid, appropriate for gestational age. Pain/Pressure: Absent     Pelvic:  Cervical exam deferred        Extremities: Normal range  of motion.     Mental Status: Normal mood and affect. Normal behavior. Normal judgment and thought content.   Assessment   25 y.o. G1P0 at [redacted]w[redacted]d by  04/10/2021, by Last Menstrual Period presenting for routine prenatal visit  Plan   pregnancy1 Problems (from 08/29/20 to present)    Problem Noted Resolved   Supervision of other normal pregnancy, antepartum 08/29/2020 by Imagene Riches, CNM No   Overview Addendum 12/06/2020  4:40 PM by Homero Fellers, MD      Nursing Staff Provider  Office Location  [facility] Dating   LMP  Language  English Anatomy US  complete  Flu Vaccine  Declines Genetic Screen  Declines   TDaP vaccine    Hgb A1C or  GTT Third trimester :   Rhogam  Not needed   LAB RESULTS   Feeding Plan  Breast Blood Type O/Positive/-- (09/23 1628)   Contraception  Antibody Negative (09/23 1628)  Circumcision  Rubella 3.17 (09/23 1628)  Pediatrician   RPR Non Reactive (09/23 1628)   Support Person  HBsAg Negative (09/23 1628)   Prenatal Classes  Encouraged HIV Non Reactive (09/23 1628)    Varicella  Immune  BTL Consent  GBS  (For PCN allergy, check sensitivities)        VBAC Consent  Pap  2021 NIL    Hgb Electro    Covid  unvaccinated CF      SMA                Previous Version       Preterm labor symptoms and general obstetric precautions including but not limited to vaginal bleeding, contractions, leaking of fluid and fetal movement were reviewed in detail with the patient. Please refer to After Visit Summary for other counseling recommendations.  She agrees that the baby is moving well during the visit. The nature of the fetal movements has changed. Much reassurance given. Fetal kick counts discussed. Audible acceleration of FHTs to 160 during use of doppler. Having her 28 weeks labs drawn today.  Return in about 2 weeks (around 01/31/2021) for return OB.  Imagene Riches, CNM  01/17/2021 9:54 AM

## 2021-01-18 LAB — 28 WEEK RH+PANEL
Basophils Absolute: 0 10*3/uL (ref 0.0–0.2)
Basos: 0 %
EOS (ABSOLUTE): 0.1 10*3/uL (ref 0.0–0.4)
Eos: 1 %
Gestational Diabetes Screen: 84 mg/dL (ref 65–139)
HIV Screen 4th Generation wRfx: NONREACTIVE
Hematocrit: 34.6 % (ref 34.0–46.6)
Hemoglobin: 11.3 g/dL (ref 11.1–15.9)
Immature Grans (Abs): 0.1 10*3/uL (ref 0.0–0.1)
Immature Granulocytes: 1 %
Lymphocytes Absolute: 1.3 10*3/uL (ref 0.7–3.1)
Lymphs: 11 %
MCH: 28.8 pg (ref 26.6–33.0)
MCHC: 32.7 g/dL (ref 31.5–35.7)
MCV: 88 fL (ref 79–97)
Monocytes Absolute: 0.7 10*3/uL (ref 0.1–0.9)
Monocytes: 6 %
Neutrophils Absolute: 9.3 10*3/uL — ABNORMAL HIGH (ref 1.4–7.0)
Neutrophils: 81 %
Platelets: 211 10*3/uL (ref 150–450)
RBC: 3.93 x10E6/uL (ref 3.77–5.28)
RDW: 11.9 % (ref 11.7–15.4)
RPR Ser Ql: NONREACTIVE
WBC: 11.4 10*3/uL — ABNORMAL HIGH (ref 3.4–10.8)

## 2021-01-31 ENCOUNTER — Other Ambulatory Visit: Payer: Self-pay

## 2021-01-31 ENCOUNTER — Ambulatory Visit (INDEPENDENT_AMBULATORY_CARE_PROVIDER_SITE_OTHER): Payer: Medicaid Other | Admitting: Obstetrics

## 2021-01-31 VITALS — BP 120/80 | Wt 204.0 lb

## 2021-01-31 DIAGNOSIS — Z3480 Encounter for supervision of other normal pregnancy, unspecified trimester: Secondary | ICD-10-CM

## 2021-01-31 DIAGNOSIS — Z3A30 30 weeks gestation of pregnancy: Secondary | ICD-10-CM

## 2021-01-31 LAB — POCT URINALYSIS DIPSTICK OB
Glucose, UA: NEGATIVE
POC,PROTEIN,UA: NEGATIVE

## 2021-01-31 NOTE — Progress Notes (Signed)
  Routine Prenatal Care Visit  Subjective  Kathryn Estrada is a 25 y.o. G1P0 at [redacted]w[redacted]d being seen today for ongoing prenatal care.  She is currently monitored for the following issues for this low-risk pregnancy and has Raynaud's phenomenon without gangrene; Prolonged capillary refill time; Bilateral cold feet; Other fatigue; Dysuria; Abnormal thyroid blood test; BMI 29.0-29.9,adult; Tobacco use disorder; B12 deficiency; and Supervision of other normal pregnancy, antepartum on their problem list.  ----------------------------------------------------------------------------------- Patient reports no complaints.   Contractions: Not present. Vag. Bleeding: None.  Movement: Present. Leaking Fluid denies.  ----------------------------------------------------------------------------------- The following portions of the patient's history were reviewed and updated as appropriate: allergies, current medications, past family history, past medical history, past social history, past surgical history and problem list. Problem list updated.  Objective  Blood pressure 120/80, weight 204 lb (92.5 kg), last menstrual period 07/04/2020. Pregravid weight 180 lb (81.6 kg) Total Weight Gain 24 lb (10.9 kg) Urinalysis: Urine Protein Negative  Urine Glucose Negative  Fetal Status:     Movement: Present     General:  Alert, oriented and cooperative. Patient is in no acute distress.  Skin: Skin is warm and dry. No rash noted.   Cardiovascular: Normal heart rate noted  Respiratory: Normal respiratory effort, no problems with respiration noted  Abdomen: Soft, gravid, appropriate for gestational age. Pain/Pressure: Present     Pelvic:  Cervical exam deferred        Extremities: Normal range of motion.     Mental Status: Normal mood and affect. Normal behavior. Normal judgment and thought content.   Assessment   25 y.o. G1P0 at [redacted]w[redacted]d by  04/10/2021, by Last Menstrual Period presenting for routine prenatal visit  Plan    pregnancy1 Problems (from 08/29/20 to present)    Problem Noted Resolved   Supervision of other normal pregnancy, antepartum 08/29/2020 by Imagene Riches, CNM No   Overview Addendum 01/19/2021  5:47 PM by Imagene Riches, CNM      Nursing Staff Provider  Office Location  [facility] Dating   LMP  Language  English Anatomy US  complete  Flu Vaccine  Declines Genetic Screen  Declines   TDaP vaccine    Hgb A1C or  GTT Third trimester : 84  Rhogam  Not needed   LAB RESULTS   Feeding Plan  Breast Blood Type O/Positive/-- (09/23 1628)   Contraception  Antibody Negative (09/23 1628)  Circumcision  Rubella 3.17 (09/23 1628)  Pediatrician   RPR Non Reactive (09/23 1628)   Support Person  HBsAg Negative (09/23 1628)   Prenatal Classes  Encouraged HIV Non Reactive (09/23 1628)    Varicella  Immune  BTL Consent  GBS  (For PCN allergy, check sensitivities)        VBAC Consent  Pap  2021 NIL    Hgb Electro    Covid  unvaccinated CF      SMA                Previous Version       Preterm labor symptoms and general obstetric precautions including but not limited to vaginal bleeding, contractions, leaking of fluid and fetal movement were reviewed in detail with the patient. Please refer to After Visit Summary for other counseling recommendations.   Return in about 2 weeks (around 02/14/2021) for return OB.  Imagene Riches, CNM  01/31/2021 2:45 PM

## 2021-02-14 ENCOUNTER — Other Ambulatory Visit: Payer: Self-pay

## 2021-02-14 ENCOUNTER — Encounter: Payer: Self-pay | Admitting: Obstetrics and Gynecology

## 2021-02-14 ENCOUNTER — Ambulatory Visit (INDEPENDENT_AMBULATORY_CARE_PROVIDER_SITE_OTHER): Payer: Medicaid Other | Admitting: Obstetrics and Gynecology

## 2021-02-14 VITALS — BP 126/82 | Wt 205.0 lb

## 2021-02-14 DIAGNOSIS — Z7185 Encounter for immunization safety counseling: Secondary | ICD-10-CM

## 2021-02-14 DIAGNOSIS — Z3480 Encounter for supervision of other normal pregnancy, unspecified trimester: Secondary | ICD-10-CM

## 2021-02-14 DIAGNOSIS — Z3A32 32 weeks gestation of pregnancy: Secondary | ICD-10-CM

## 2021-02-14 NOTE — Progress Notes (Signed)
Routine Prenatal Care Visit  Subjective  Kathryn Estrada is a 25 y.o. G1P0 at [redacted]w[redacted]d being seen today for ongoing prenatal care.  She is currently monitored for the following issues for this low-risk pregnancy and has Raynaud's phenomenon without gangrene; Prolonged capillary refill time; Bilateral cold feet; Other fatigue; Dysuria; Abnormal thyroid blood test; BMI 29.0-29.9,adult; Tobacco use disorder; B12 deficiency; and Supervision of other normal pregnancy, antepartum on their problem list.  ----------------------------------------------------------------------------------- Patient reports intermittent pain in her lower pelvis that feels as if its "pulling." Patient also reports increase in sx of heartburn and nausea..   Contractions: Not present. Vag. Bleeding: None.  Movement: Present. Denies leaking of fluid.  ----------------------------------------------------------------------------------- The following portions of the patient's history were reviewed and updated as appropriate: allergies, current medications, past family history, past medical history, past social history, past surgical history and problem list. Problem list updated.   Objective  Blood pressure 126/82, weight 205 lb (93 kg), last menstrual period 07/04/2020. Pregravid weight 180 lb (81.6 kg) Total Weight Gain 25 lb (11.3 kg) Urinalysis:      Fetal Status: Fetal Heart Rate (bpm): 140 Fundal Height: 33 cm Movement: Present     General:  Alert, oriented and cooperative. Patient is in no acute distress.  Skin: Skin is warm and dry. No rash noted.   Cardiovascular: Normal heart rate noted  Respiratory: Normal respiratory effort, no problems with respiration noted  Abdomen: Soft, gravid, appropriate for gestational age. Pain/Pressure: Present     Pelvic:  Cervical exam deferred        Extremities: Normal range of motion.  Edema: None  ental Status: Normal mood and affect. Normal behavior. Normal judgment and thought  content.     Assessment   25 y.o. G1P0 at [redacted]w[redacted]d by  04/10/2021, by Last Menstrual Period presenting for routine prenatal visit  Plan   pregnancy1 Problems (from 08/29/20 to present)    Problem Noted Resolved   Supervision of other normal pregnancy, antepartum 08/29/2020 by Imagene Riches, CNM No   Overview Addendum 02/14/2021  2:36 PM by Orlie Pollen, CNM      Nursing Staff Provider  Office Location  [facility] Dating   LMP = 9w [place]  Language  English Anatomy US  complete  Flu Vaccine    declines Genetic Screen  Declines   TDaP vaccine   undecided Hgb A1C or  GTT Third trimester : 63  Rhogam   n/a   LAB RESULTS   Feeding Plan  Breast Blood Type O/Positive/-- (09/23 1628)   Contraception  unsure (patient has had negative experience with [facility] in past) Antibody Negative (09/23 1628)  Circumcision  n/a Rubella 3.17 (09/23 1628)  Pediatrician    RPR Non Reactive (09/23 1628)   Support Person  Kathryn Estrada HBsAg Negative (09/23 1628)   Prenatal Classes  Encouraged HIV Non Reactive (09/23 1628)    Varicella  Immune  BTL Consent  n/a GBS  (For PCN allergy, check sensitivities)        VBAC Consent  n/a Pap  2021 NIL    Hgb Electro   n/a  Covid  Unvaccinated - Covid x2 - last in 05/2020 CF  declined     SMA  declined               Previous Version      -Reviewed experience of round ligament pain in pregnancy -Pepcid and Tums for heartburn -Taking zofran for nausea- states it is well-controlled -Reviewed recommendation for Tdap  in pregnancy - patient is currently undecided -Declined flu vaccination -Discussed recommendation for pregnancy spacing, patient undecided regarding postpartum BC. Patient reports negative experiences with birth control in the past.   Preterm labor precautions including but not limited to vaginal bleeding, contractions, leaking of fluid and fetal movement were reviewed in detail with the patient.    Return in about 2 weeks (around 02/28/2021) for 2 weeks for ROB,  4 weeks for ROB with growth/presentation [place].  Orlie Pollen, CNM, MSN [facility], [facility] 02/14/2021, 2:37 PM

## 2021-02-28 ENCOUNTER — Ambulatory Visit (INDEPENDENT_AMBULATORY_CARE_PROVIDER_SITE_OTHER): Payer: Medicaid Other | Admitting: Obstetrics and Gynecology

## 2021-02-28 ENCOUNTER — Other Ambulatory Visit: Payer: Self-pay

## 2021-02-28 ENCOUNTER — Encounter: Payer: Self-pay | Admitting: Obstetrics and Gynecology

## 2021-02-28 VITALS — BP 120/70 | Wt 209.0 lb

## 2021-02-28 DIAGNOSIS — Z3A34 34 weeks gestation of pregnancy: Secondary | ICD-10-CM

## 2021-02-28 DIAGNOSIS — Z3480 Encounter for supervision of other normal pregnancy, unspecified trimester: Secondary | ICD-10-CM

## 2021-02-28 DIAGNOSIS — I7300 Raynaud's syndrome without gangrene: Secondary | ICD-10-CM

## 2021-02-28 DIAGNOSIS — R42 Dizziness and giddiness: Secondary | ICD-10-CM

## 2021-02-28 LAB — POCT URINALYSIS DIPSTICK OB
Glucose, UA: NEGATIVE
POC,PROTEIN,UA: NEGATIVE

## 2021-02-28 NOTE — Addendum Note (Signed)
Addended by: Vernia Buff A on: 02/28/2021 02:57 PM   Modules accepted: Orders

## 2021-02-28 NOTE — Progress Notes (Signed)
Routine Prenatal Care Visit  Subjective  Kathryn Estrada is a 25 y.o. G1P0 at [redacted]w[redacted]d being seen today for ongoing prenatal care.  She is currently monitored for the following issues for this low-risk pregnancy and has Raynaud's phenomenon without gangrene; Prolonged capillary refill time; Bilateral cold feet; Other fatigue; Dysuria; Abnormal thyroid blood test; BMI 29.0-29.9,adult; Tobacco use disorder; B12 deficiency; and Supervision of other normal pregnancy, antepartum on their problem list.  ----------------------------------------------------------------------------------- Patient reports that for the last week and a half she has been experiencing episodes 2-3 times a day where she becomes faint and lightheaded.  She reports that she has blurry vision during these episodes.  She has not previously had these types of symptoms.  She reports that she does not generally have a headache during these times.  The episodes will last for 20 to 30 minutes.  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  She was undergoing evaluation with vascular who had recommended outpatient rheumatology follow-up.  However she never saw a rheumatologist.  She was having the past episodes where her feet would go white although she reports that these symptoms have happened less frequently since she has been pregnant.  She reports she has only had about 2 times where her feet have turned white since being pregnant.  Vascular notes indicate that her mother was undergoing evaluation for lupus which the patient reports was negative.  Contractions: Irregular. Vag. Bleeding: None.  Movement: Present. Denies leaking of fluid.  ----------------------------------------------------------------------------------- The following portions of the patient's history were reviewed and updated as appropriate: allergies, current medications, past family history, past medical history, past social  history, past surgical history and problem list. Problem list updated.   Objective  Blood pressure 120/70, weight 209 lb (94.8 kg), last menstrual period 07/04/2020. Pregravid weight 180 lb (81.6 kg) Total Weight Gain 29 lb (13.2 kg) Urinalysis:      Fetal Status: Fetal Heart Rate (bpm): 120 Fundal Height: 36 cm Movement: Present     General:  Alert, oriented and cooperative. Patient is in no acute distress.  Skin: Skin is warm and dry. No rash noted.   Cardiovascular: Normal heart rate noted  Respiratory: Normal respiratory effort, no problems with respiration noted  Abdomen: Soft, gravid, appropriate for gestational age. Pain/Pressure: Present     Pelvic:  Cervical exam deferred        Extremities: Normal range of motion.  Edema: None  Mental Status: Normal mood and affect. Normal behavior. Normal judgment and thought content.     Assessment   25 y.o. G1P0 at [redacted]w[redacted]d by  04/10/2021, by Last Menstrual Period presenting for routine prenatal visit  Plan   pregnancy1 Problems (from 08/29/20 to present)    Problem Noted Resolved   Supervision of other normal pregnancy, antepartum 08/29/2020 by Imagene Riches, CNM No   Overview Addendum 02/14/2021  2:36 PM by Orlie Pollen, CNM      Nursing Staff Provider  Office Location  [facility] Dating   LMP = 9w [place]  Language  English Anatomy US  complete  Flu Vaccine    declines Genetic Screen  Declines   TDaP vaccine   undecided Hgb A1C or  GTT Third trimester : 77  Rhogam   n/a   LAB RESULTS   Feeding Plan  Breast Blood Type O/Positive/-- (09/23 1628)   Contraception  unsure (patient has had negative experience with [facility] in past) Antibody Negative (09/23 1628)  Circumcision  n/a  Rubella 3.17 (09/23 1628)  Pediatrician    RPR Non Reactive (09/23 1628)   Support Person  Dorothea Ogle HBsAg Negative (09/23 1628)   Prenatal Classes  Encouraged HIV Non Reactive (09/23 1628)    Varicella  Immune  BTL Consent  n/a GBS  (For PCN allergy, check  sensitivities)        VBAC Consent  n/a Pap  2021 NIL    Hgb Electro   n/a  Covid  Unvaccinated - Covid x2 - last in 05/2020 CF  declined     SMA  declined               Previous Version       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 Possible that patient's current symptoms may be related to this history.  Will refer to cardiology for recurrent episodes of feeling faint and positive orthostatics today in office.  Encouraged patient to also follow up with vascular.   Gestational age appropriate obstetric precautions including but not limited to vaginal bleeding, contractions, leaking of fluid and fetal movement were reviewed in detail with the patient.    Return in about 1 week (around 03/07/2021) for ROB in person.  Homero Fellers MD [facility], [facility] 02/28/2021, 2:38 PM

## 2021-02-28 NOTE — Patient Instructions (Signed)

## 2021-03-02 ENCOUNTER — Ambulatory Visit (INDEPENDENT_AMBULATORY_CARE_PROVIDER_SITE_OTHER): Payer: Medicaid Other | Admitting: Cardiology

## 2021-03-02 ENCOUNTER — Other Ambulatory Visit: Payer: Self-pay

## 2021-03-02 ENCOUNTER — Encounter: Payer: Self-pay | Admitting: Cardiology

## 2021-03-02 VITALS — BP 126/80 | HR 113 | Ht 65.0 in | Wt 209.0 lb

## 2021-03-02 DIAGNOSIS — I7300 Raynaud's syndrome without gangrene: Secondary | ICD-10-CM | POA: Diagnosis not present

## 2021-03-02 DIAGNOSIS — Z3A34 34 weeks gestation of pregnancy: Secondary | ICD-10-CM | POA: Diagnosis not present

## 2021-03-02 DIAGNOSIS — H8110 Benign paroxysmal vertigo, unspecified ear: Secondary | ICD-10-CM | POA: Diagnosis not present

## 2021-03-02 NOTE — Progress Notes (Signed)
Cardiology Office Note:    Date:  03/02/2021   ID:  Kathryn Estrada, DOB 1996/08/27, MRN [id number]  PCP:  Ronnell Freshwater, NP   [place]  Cardiologist:  Kate Sable, MD  Advanced Practice Provider:  No care team member to display Electrophysiologist:  None       Referring MD: Ronnell Freshwater, NP   Chief Complaint  Patient presents with  . New Patient (Initial Visit)    Referred by PCP for dizziness. Meds reviewed verbally with patient     History of Present Illness:    Kathryn Estrada is a 25 y.o. female with no significant past medical history, currently [redacted] weeks pregnant who presents due to dizziness.  Patient complains of dizziness over the last several weeks.  Symptoms typically occur when she stands up from a seated position or bends over to put on her shoes.  She denies palpitations, chest pain.  Endorses shortness of breath which came along with pregnancy.  Denies any history of heart disease, denies syncope.  Initial symptoms of dizziness occurred while she was driving, she states feeling lightheaded, blurry vision.  She pulled over to the side of the road.  She subsequently followed up with OB/GYN where she felt dizzy during orthostatic exam, moving from sitting to standing.  Her blood pressures actually went up from normal to systolic of 128N while standing.  She states having history of discolorations in both arms and feet.  History reviewed. No pertinent past medical history.  History reviewed. No pertinent surgical history.  Current Medications: Current Meds  Medication Sig  . ondansetron (ZOFRAN ODT) 4 MG disintegrating tablet Take 1 tablet (4 mg total) by mouth every 6 (six) hours as needed for nausea.  . Prenatal Vit-Fe Fumarate-FA (PRENATAL VITAMIN AND MINERAL) 28-0.8 MG TABS Take 1 tablet by mouth daily.     Allergies:   Patient has no known allergies.   Social History   Socioeconomic History  . Marital status: Single     Spouse name: Not on file  . Number of children: Not on file  . Years of education: Not on file  . Highest education level: Not on file  Occupational History  . Not on file  Tobacco Use  . Smoking status: Current Every Day Smoker    Types: Cigarettes, E-cigarettes  . Smokeless tobacco: Never Used  Vaping Use  . Vaping Use: Every day  . Devices: vape   Substance and Sexual Activity  . Alcohol use: Yes    Comment: ocassinally/ once a month   . Drug use: Never  . Sexual activity: Not on file  Other Topics Concern  . Not on file  Social History Narrative  . Not on file   Social Determinants of Health   Financial Resource Strain: Not on file  Food Insecurity: Not on file  Transportation Needs: Not on file  Physical Activity: Not on file  Stress: Not on file  Social Connections: Not on file     Family History: The patient's family history includes Anemia in her mother; Diabetes in an other family member; Neurofibromatosis in her mother.  ROS:   Please see the history of present illness.     All other systems reviewed and are negative.  EKGs/Labs/Other Studies Reviewed:    The following studies were reviewed today:   EKG:  EKG is  ordered today.  The ekg ordered today demonstrates sinus tachycardia, heart rate 113  Recent Labs: 06/06/2020: BUN 13; Creatinine,  Ser 0.85; Potassium 4.7; Sodium 139 01/17/2021: Hemoglobin 11.3; Platelets 211  Recent Lipid Panel No results found for: CHOL, TRIG, HDL, CHOLHDL, VLDL, LDLCALC, LDLDIRECT   Risk Assessment/Calculations:      Physical Exam:    VS:  BP 126/80 (BP Location: Left Arm, Patient Position: Sitting, Cuff Size: Normal)   Pulse (!) 113   Ht 5\' 5"  (1.651 m)   Wt 209 lb (94.8 kg)   LMP 07/04/2020   SpO2 98%   BMI 34.78 kg/m     Wt Readings from Last 3 Encounters:  03/02/21 209 lb (94.8 kg)  02/28/21 209 lb (94.8 kg)  02/14/21 205 lb (93 kg)     GEN:  Well nourished, well developed in no acute  distress HEENT: Normal NECK: No JVD; No carotid bruits LYMPHATICS: No lymphadenopathy CARDIAC: RRR, no murmurs, rubs, gallops RESPIRATORY:  Clear to auscultation without rales, wheezing or rhonchi  ABDOMEN: Soft, non-tender, non-distended MUSCULOSKELETAL:  No edema; No deformity  SKIN: Warm and dry NEUROLOGIC:  Alert and oriented x 3 PSYCHIATRIC:  Normal affect   ASSESSMENT:    1. Benign paroxysmal positional vertigo, unspecified laterality   2. [redacted] weeks gestation of pregnancy   3. Raynaud's phenomenon without gangrene    PLAN:    In order of problems listed above:  1. Dizziness associated with change in position.  Symptoms consistent with positional vertigo.  Conservative measures advised, being careful while moving from sitting to standing, trying to sit if becoming dizzy.  Orthostatic vitals did not reveal any evidence for orthostasis.  Hopefully her symptoms resolve after pregnancy as symptoms of dizziness coincided with pregnancy.  If symptoms become severe, trial of Antivert could be pursued by OB/primary care if deemed safe.  Etiology not likely cardiac.  She denies any cardiac symptoms. 2. [redacted] weeks pregnant, management as per GYN. 3. History of feet discoloration, consistent with Raynaud's phenomenon.  Recommend evaluation by rheumatology.   Follow-up as needed   Medication Adjustments/Labs and Tests Ordered: Current medicines are reviewed at length with the patient today.  Concerns regarding medicines are outlined above.  Orders Placed This Encounter  Procedures  . EKG 12-Lead   No orders of the defined types were placed in this encounter.   Patient Instructions  Medication Instructions:  Your physician recommends that you continue on your current medications as directed. Please refer to the Current Medication list given to you today. *If you need a refill on your cardiac medications before your next appointment, please call your pharmacy*   Lab Work: None  ordered If you have labs (blood work) drawn today and your tests are completely normal, you will receive your results only by: Marland Kitchen MyChart Message (if you have MyChart) OR . A paper copy in the mail If you have any lab test that is abnormal or we need to change your treatment, we will call you to review the results.   Testing/Procedures: None ordered   Follow-Up: At [facility], you and your health needs are our priority.  As part of our continuing mission to provide you with exceptional heart care, we have created designated Provider Care Teams.  These Care Teams include your primary Cardiologist (physician) and Advanced Practice Providers (APPs -  Physician Assistants and Nurse Practitioners) who all work together to provide you with the care you need, when you need it.  We recommend signing up for the patient portal called "MyChart".  Sign up information is provided on this After Visit Summary.  MyChart is  used to connect with patients for Virtual Visits (Telemedicine).  Patients are able to view lab/test results, encounter notes, upcoming appointments, etc.  Non-urgent messages can be sent to your provider as well.   To learn more about what you can do with MyChart, go to [email or web address].    Your next appointment:   Follow up as needed   The format for your next appointment:   In Person  Provider:   Kate Sable, MD   Other Instructions      Signed, Kate Sable, MD  03/02/2021 12:30 PM    [place]

## 2021-03-02 NOTE — Patient Instructions (Signed)

## 2021-03-05 ENCOUNTER — Ambulatory Visit: Payer: Self-pay | Admitting: Cardiology

## 2021-03-05 ENCOUNTER — Telehealth: Payer: Self-pay

## 2021-03-05 NOTE — Telephone Encounter (Signed)
Pt's mom, Jenny Reichmann, calling to see if pt can take one capful of miralax daily.  [phone]  I called pt back to see if things had gotten worse since I spoke with her this am;  Pt states she is just wanting to know how to prevent the constipation.  Adv Metamucil, Fibercon, Citrucel; add fiber by eating bran cereal, oatmeal, leafy greens, prunes, fresh fruits, vegetables, colace, increase water intake by at least a quart. May try prune juice and some pts have good results with heated prune juice.

## 2021-03-05 NOTE — Telephone Encounter (Signed)
Pt called after hour nurse 03/04/21 8:11pm c/o 35wks; no bm in 4-5days; has taken colace for 3d and miralax for 4d; cannot sit down; wondering about a glycerin supp; has a lot of pressure low in her low stomach, sometimes pelvic area for 2-3d off an on.  [phone]  After hour nurse per J[facility] adv pt to take a glycerin supp or fleet's enema; is still unable to a bm and having severe pain, go to the ED; soaking in tub may work as a comfort measure for the pain.  Spoke c pt this am; states she did the glycerin supp and had good results; is feeling much better.

## 2021-03-14 ENCOUNTER — Ambulatory Visit (INDEPENDENT_AMBULATORY_CARE_PROVIDER_SITE_OTHER): Payer: Medicaid Other

## 2021-03-14 ENCOUNTER — Other Ambulatory Visit (HOSPITAL_COMMUNITY)
Admission: RE | Admit: 2021-03-14 | Discharge: 2021-03-14 | Disposition: A | Discharge status: Home / Self Care | Payer: Medicaid Other | Source: Ambulatory Visit | Attending: Obstetrics and Gynecology | Admitting: Obstetrics and Gynecology

## 2021-03-14 ENCOUNTER — Ambulatory Visit (INDEPENDENT_AMBULATORY_CARE_PROVIDER_SITE_OTHER): Payer: Medicaid Other | Admitting: Obstetrics and Gynecology

## 2021-03-14 ENCOUNTER — Other Ambulatory Visit: Payer: Self-pay

## 2021-03-14 VITALS — BP 126/82 | Wt 211.0 lb

## 2021-03-14 DIAGNOSIS — N939 Abnormal uterine and vaginal bleeding, unspecified: Secondary | ICD-10-CM | POA: Diagnosis present

## 2021-03-14 DIAGNOSIS — Z3685 Encounter for antenatal screening for Streptococcus B: Secondary | ICD-10-CM

## 2021-03-14 DIAGNOSIS — Z3A36 36 weeks gestation of pregnancy: Secondary | ICD-10-CM

## 2021-03-14 DIAGNOSIS — Z113 Encounter for screening for infections with a predominantly sexual mode of transmission: Secondary | ICD-10-CM

## 2021-03-14 DIAGNOSIS — O321XX0 Maternal care for breech presentation, not applicable or unspecified: Secondary | ICD-10-CM

## 2021-03-14 DIAGNOSIS — Z3480 Encounter for supervision of other normal pregnancy, unspecified trimester: Secondary | ICD-10-CM | POA: Diagnosis not present

## 2021-03-14 LAB — POCT URINALYSIS DIPSTICK OB
Glucose, UA: NEGATIVE
POC,PROTEIN,UA: NEGATIVE

## 2021-03-15 ENCOUNTER — Telehealth: Payer: Self-pay | Admitting: Obstetrics and Gynecology

## 2021-03-15 ENCOUNTER — Telehealth: Payer: Self-pay

## 2021-03-15 DIAGNOSIS — O321XX0 Maternal care for breech presentation, not applicable or unspecified: Secondary | ICD-10-CM | POA: Insufficient documentation

## 2021-03-15 NOTE — Telephone Encounter (Signed)
Pt calling; would like to go ahead and schedule c/s.  [phone]

## 2021-03-15 NOTE — Progress Notes (Signed)
Routine Prenatal Care Visit  Subjective  Kathryn Estrada is a 25 y.o. G1P0 at [redacted]w[redacted]d being seen today for ongoing prenatal care.  She is currently monitored for the following issues for this high-risk pregnancy and has Raynaud's phenomenon without gangrene; Prolonged capillary refill time; Bilateral cold feet; Other fatigue; Dysuria; Abnormal thyroid blood test; BMI 29.0-29.9,adult; Tobacco use disorder; B12 deficiency; Supervision of other normal pregnancy, antepartum; and Breech presentation, no version on their problem list.  ----------------------------------------------------------------------------------- Patient reports constipation. Patient states she has not had a bowel movement in 4-5 days. She reports she has taken stool softners, increased fiber intake, hyrdation, and prune juice without positive effect. Patient states she is extremely uncomfortable and has difficulty tolerating sitting. Additionally, patient notes she has had light, vaginal spotting, noting it when she wipes. She also reports rib pain/discomfort on her right sides that radiates towards her back.  Contractions: Irritability. Vag. Bleeding: Scant.  Movement: Present. Denies leaking of fluid.  ----------------------------------------------------------------------------------- The following portions of the patient's history were reviewed and updated as appropriate: allergies, current medications, past family history, past medical history, past social history, past surgical history and problem list. Problem list updated.   Objective  Blood pressure 126/82, weight 211 lb (95.7 kg), last menstrual period 07/04/2020. Pregravid weight 180 lb (81.6 kg) Total Weight Gain 31 lb (14.1 kg) Urinalysis:      Fetal Status: Fetal Heart Rate (bpm): 145 Fundal Height: 37 cm Movement: Present  Presentation: Pilar Plate Breech  [place]:   Indications:fundal height does not correlate to gestational age  Findings:   [place] intrauterine  pregnancy is visualized with FHR at 136 BPM.    Biometrics give an (U/S) Gestational age of [redacted]w[redacted]d and an (U/S) EDD of 04/05/21; this correlates with  the clinically established Estimated Date of Delivery: 04/10/21.    Fetal presentation is frank Breech.   Placenta: anterior. Grade: 2  AFI: 20.5 cm, upper limits of normal.   Growth percentile is 56.1 (head measuring 2 weeks ahead).  AC percentile is 77.2.  EFW: 2,958g (6lbs 8oz)   General:  Alert, oriented and cooperative. Patient is in no acute distress.  Skin: Skin is warm and dry. No rash noted.   Cardiovascular: Normal heart rate noted  Respiratory: Normal respiratory effort, no problems with respiration noted  Abdomen: Soft, gravid, appropriate for gestational age. Pain/Pressure: Present     Pelvic:  Cervical exam deferred        Extremities: Normal range of motion.     ental Status: Normal mood and affect. Normal behavior. Normal judgment and thought content.     Assessment   25 y.o. G1P0 at [redacted]w[redacted]d by  04/10/2021, by Last Menstrual Period presenting for routine prenatal visit  Plan   pregnancy1 Problems (from 08/29/20 to present)    Problem Noted Resolved   Supervision of other normal pregnancy, antepartum 08/29/2020 by Imagene Riches, CNM No   Overview Addendum 02/14/2021  2:36 PM by Orlie Pollen, CNM      Nursing Staff Provider  Office Location  [facility] Dating   LMP = 9w [place]  Language  English Anatomy US  complete  Flu Vaccine    declines Genetic Screen  Declines   TDaP vaccine   undecided Hgb A1C or  GTT Third trimester : 53  Rhogam   n/a   LAB RESULTS   Feeding Plan  Breast Blood Type O/Positive/-- (09/23 1628)   Contraception  unsure (patient has had negative experience with BC in past) Antibody Negative (  09/23 1628)  Circumcision  n/a Rubella 3.17 (09/23 1628)  Pediatrician    RPR Non Reactive (09/23 1628)   Support Person  Dorothea Ogle HBsAg Negative (09/23 1628)   Prenatal Classes  Encouraged HIV Non Reactive  (09/23 1628)    Varicella  Immune  BTL Consent  n/a GBS  (For PCN allergy, check sensitivities)        VBAC Consent  n/a Pap  2021 NIL    Hgb Electro   n/a  Covid  Unvaccinated - Covid x2 - last in 05/2020 CF  declined     SMA  declined               Previous Version      -Reviewed OTC options for constipation, encouraged enema -GBS/aptima collected today - patient declined cervical exam due to discomfort from constipation. -Reviewed US findings with patient, discussed frank breech presentation at 36 weeks. Offered option of ECV, discussed risk vs benefits. Additionally, reviewed option of primary cesarean delivery. Questions answered to the best of this providers ability. Patient unsure of how she desires to proceed. Would like time to discuss with husband. Plan made for patient to follow-up via phone or message to determine next step. Additionally, encouraged returning to care this week for an ROB with MD to discuss. Patient stated understanding.  -Reviewed plan of care with Dr. Gilman Schmidt in office  Preterm labor precautions including but not limited to vaginal bleeding, contractions, leaking of fluid and fetal movement were reviewed in detail with the patient.    Return in about 1 week (around 03/21/2021) for [place].  Orlie Pollen, CNM, MSN [facility] 03/15/2021, 11:40 AM

## 2021-03-15 NOTE — Telephone Encounter (Signed)
Spoke with patient. Patient desires to schedule cesarean delivery and declines proceeding with a ECV at this time. Patient does not currently have additional follow-up scheduled. Message sent to scheduler to get patient in for ROB next with (preferably with Dr. Gilman Schmidt, who will be performing her delivery). Confirmed availability for c/s on 04/03/21 with Asencion Partridge RN, from L&D. Message sent to surgery scheduler to confirm date.

## 2021-03-16 LAB — STREP GP B NAA: Strep Gp B NAA: NEGATIVE

## 2021-03-16 LAB — CERVICOVAGINAL ANCILLARY ONLY
Bacterial Vaginitis (gardnerella): NEGATIVE
Candida Glabrata: NEGATIVE
Candida Vaginitis: NEGATIVE
Chlamydia: NEGATIVE
Comment: NEGATIVE
Comment: NEGATIVE
Comment: NEGATIVE
Comment: NEGATIVE
Comment: NEGATIVE
Comment: NORMAL
Neisseria Gonorrhea: NEGATIVE
Trichomonas: NEGATIVE

## 2021-03-19 ENCOUNTER — Telehealth: Payer: Self-pay

## 2021-03-19 NOTE — Telephone Encounter (Signed)
Called patient to schedule c-section with [facility]  DOS 4/12  H&P 3/30 @ 3:30   Covid testing 4/11 @ 9:00, Elim phone call appointment to be requested - date and time will be included on H&P paper work. Also all appointments will be updated on pt MyChart. Explained that this appointment has a call window. Based on the time scheduled will indicate if the call will be received within a 4 hour window before 1:00 or after.  Advised that pt may also receive calls from the hospital pharmacy and pre-service center.  Confirmed pt has [employer] as primary insurance. No secondary insurance.

## 2021-03-19 NOTE — Telephone Encounter (Signed)
-----   Message from Orlie Pollen, [place] sent at 03/15/2021 11:08 AM EDT ----- Regarding: Schedule C Section Surgery Booking Request Patient Full Name:  Kathryn Estrada  MRN: [id number]  DOB: 1996-07-24  Surgeon: Gilman Schmidt, MD Requested Surgery Date and Time: 04/03/21 at 07:30 Primary Diagnosis AND Code: Breech presentation in pregnancy, O32.1 Secondary Diagnosis and Code:  Surgical Procedure: Cesarean Section RNFA Requested?: No L&D Notification: Yes Admission Status: surgery admit Length of Surgery: 125 min Special Case Needs: No H&P: Yes Phone Interview???:  No Interpreter: No Medical Clearance:  no Special Scheduling Instructions: No Any known health/anesthesia issues, diabetes, sleep apnea, latex allergy, defibrillator/pacemaker?: No Acuity: P2   (P1 highest, P2 delay may cause harm, P3 low, elective gyn, P4 lowest)

## 2021-03-19 NOTE — Telephone Encounter (Signed)
Orders placed.

## 2021-03-21 ENCOUNTER — Other Ambulatory Visit: Payer: Self-pay

## 2021-03-21 ENCOUNTER — Ambulatory Visit (INDEPENDENT_AMBULATORY_CARE_PROVIDER_SITE_OTHER): Payer: 59 | Admitting: Obstetrics and Gynecology

## 2021-03-21 ENCOUNTER — Encounter: Payer: Self-pay | Admitting: Obstetrics and Gynecology

## 2021-03-21 VITALS — BP 122/70 | Ht 65.0 in | Wt 215.2 lb

## 2021-03-21 DIAGNOSIS — O321XX0 Maternal care for breech presentation, not applicable or unspecified: Secondary | ICD-10-CM

## 2021-03-21 DIAGNOSIS — Z3A37 37 weeks gestation of pregnancy: Secondary | ICD-10-CM

## 2021-03-21 DIAGNOSIS — Z3480 Encounter for supervision of other normal pregnancy, unspecified trimester: Secondary | ICD-10-CM

## 2021-03-21 LAB — FETAL NONSTRESS TEST

## 2021-03-21 NOTE — Patient Instructions (Signed)
Cesarean Delivery Cesarean birth, or cesarean delivery, is the surgical delivery of a baby through an incision in the abdomen and the uterus. This may be referred to as a C-section. This procedure may be scheduled ahead of time, or it may be done in an emergency situation. Tell a health care provider about:  Any allergies you have.  All medicines you are taking, including vitamins, herbs, eye drops, creams, and over-the-counter medicines.  Any problems you or family members have had with anesthetic medicines.  Any blood disorders you have.  Any surgeries you have had.  Any medical conditions you have.  Whether you or any members of your family have a history of deep vein thrombosis (DVT) or pulmonary embolism (PE). What are the risks? Generally, this is a safe procedure. However, problems may occur, including:  Infection.  Bleeding.  Allergic reactions to medicines.  Damage to other structures or organs.  Blood clots.  Injury to your baby. What happens before the procedure? General instructions  Follow instructions from your health care provider about eating or drinking restrictions.  If you know that you are going to have a cesarean delivery, do not shave your pubic area. Shaving before the procedure may increase your risk of infection.  Plan to have someone take you home from the hospital.  Ask your health care provider what steps will be taken to prevent infection. These may include: ? Removing hair at the surgery site. ? Washing skin with a germ-killing soap. ? Taking antibiotic medicine.  Depending on the reason for your cesarean delivery, you may have a physical exam or additional testing, such as an ultrasound.  You may have your blood or urine tested. Questions for your health care provider  Ask your health care provider about: ? Changing or stopping your regular medicines. This is especially important if you are taking diabetes medicines or blood  thinners. ? Your pain management plan. This is especially important if you plan to breastfeed your baby. ? How long you will be in the hospital after the procedure. ? Any concerns you may have about receiving blood products, if you need them during the procedure. ? Cord blood banking, if you plan to collect your baby's umbilical cord blood.  You may also want to ask your health care provider: ? Whether you will be able to hold or breastfeed your baby while you are still in the operating room. ? Whether your baby can stay with you immediately after the procedure and during your recovery. ? Whether a family member or a person of your choice can go with you into the operating room and stay with you during the procedure, immediately after the procedure, and during your recovery. What happens during the procedure?  An IV will be inserted into one of your veins.  Fluid and medicines, such as antibiotics, will be given before the surgery.  Fetal monitors will be placed on your abdomen to check your baby's heart rate.  You may be given a special warming gown to wear to keep your temperature stable.  A catheter may be inserted into your bladder through your urethra. This drains your urine during the procedure.  You may be given one or more of the following: ? A medicine to numb the area (local anesthetic). ? A medicine to make you fall asleep (general anesthetic). ? A medicine (regional anesthetic) that is injected into your back or through a small thin tube placed in your back (spinal anesthetic or epidural anesthetic). This  numbs everything below the injection site and allows you to stay awake during your procedure. If this makes you feel nauseous, tell your health care provider. Medicines will be available to help reduce any nausea you may feel.  An incision will be made in your abdomen, and then in your uterus.  If you are awake during your procedure, you may feel tugging and pulling in your  abdomen, but you should not feel pain. If you feel pain, tell your health care provider immediately.  Your baby will be removed from your uterus. You may feel more pressure or pushing while this happens.  Immediately after birth, your baby will be dried and kept warm. You may be able to hold and breastfeed your baby.  The umbilical cord may be clamped and cut during this time. This usually occurs after waiting a period of 1-2 minutes after delivery.  Your placenta will be removed from your uterus.  Your incisions will be closed with stitches (sutures). Staples, skin glue, or adhesive strips may also be applied to the incision in your abdomen.  Bandages (dressings) may be placed over the incision in your abdomen. The procedure may vary among health care providers and hospitals.   What happens after the procedure?  Your blood pressure, heart rate, breathing rate, and blood oxygen level will be monitored until you are discharged from the hospital.  You may continue to receive fluids and medicines through an IV.  You will have some pain. Medicines will be available to help control your pain.  To help prevent blood clots: ? You may be given medicines. ? You may have to wear compression stockings or devices. ? You will be encouraged to walk around when you are able.  Hospital staff will encourage and support bonding with your baby. Your hospital may have you and your baby to stay in the same room (rooming in) during your hospital stay to encourage successful bonding and breastfeeding.  You may be encouraged to cough and breathe deeply often. This helps to prevent lung problems.  If you have a catheter draining your urine, it will be removed as soon as possible after your procedure. Summary  Cesarean birth, or cesarean delivery, is the surgical delivery of a baby through an incision in the abdomen and the uterus.  Follow instructions from your health care provider about eating or  drinking restrictions before the procedure.  You will have some pain after the procedure. Medicines will be available to help control your pain.  Hospital staff will encourage and support bonding with your baby after the procedure. Your hospital may have you and your baby to stay in the same room (rooming in) during your hospital stay to encourage successful bonding and breastfeeding. This information is not intended to replace advice given to you by your health care provider. Make sure you discuss any questions you have with your health care provider. Document Revised: 08/07/2020 Document Reviewed: 06/15/2018 Elsevier Patient Education  [place].

## 2021-03-21 NOTE — Progress Notes (Signed)
Kathryn Estrada is an 25 y.o. female.   Chief Complaint: Breech Presentation HPI: She is feeling well. She has no new complaints. She reports normal fetal movement. She denies leakage of fluid. She denies vaginal bleeding. She denies contractions. Per pregnancy has been complicated by maternal obesity and breech presentation.   pregnancy1 Problems (from 08/29/20 to present)    Problem Noted Resolved   Supervision of other normal pregnancy, antepartum 08/29/2020 by Imagene Riches, CNM No   Overview Addendum 02/14/2021  2:36 PM by Orlie Pollen, CNM      Nursing Staff Provider  Office Location  [facility] Dating   LMP = 9w [place]  Language  English Anatomy US  complete  Flu Vaccine    declines Genetic Screen  Declines   TDaP vaccine   undecided Hgb A1C or  GTT Third trimester : 48  Rhogam   n/a   LAB RESULTS   Feeding Plan  Breast Blood Type O/Positive/-- (09/23 1628)   Contraception  unsure (patient has had negative experience with BC in past) Antibody Negative (09/23 1628)  Circumcision  n/a Rubella 3.17 (09/23 1628)  Pediatrician    RPR Non Reactive (09/23 1628)   Support Person  Dorothea Ogle HBsAg Negative (09/23 1628)   Prenatal Classes  Encouraged HIV Non Reactive (09/23 1628)    Varicella  Immune  BTL Consent  n/a GBS  (For PCN allergy, check sensitivities)        VBAC Consent  n/a Pap  2021 NIL    Hgb Electro   n/a  Covid  Unvaccinated - Covid x2 - last in 05/2020 CF  declined     SMA  declined               Previous Version       History reviewed. No pertinent past medical history.  History reviewed. No pertinent surgical history.  Family History  Problem Relation Age of Onset  . Neurofibromatosis Mother   . Anemia Mother   . Diabetes Other    Social History:  reports that she has been smoking cigarettes and e-cigarettes. She has never used smokeless tobacco. She reports current alcohol use. She reports that she does not use drugs.  Allergies: No Known Allergies  (Not  in a hospital admission)   No results found for this or any previous visit (from the past 48 hour(s)). No results found.  Review of Systems  Constitutional: Negative for chills and fever.  HENT: Negative for congestion, hearing loss and sinus pain.   Respiratory: Negative for cough, shortness of breath and wheezing.   Cardiovascular: Negative for chest pain, palpitations and leg swelling.  Gastrointestinal: Negative for abdominal pain, constipation, diarrhea, nausea and vomiting.  Genitourinary: Negative for dysuria, flank pain, frequency, hematuria and urgency.  Musculoskeletal: Negative for back pain.  Skin: Negative for rash.  Neurological: Negative for dizziness and headaches.  Psychiatric/Behavioral: Negative for suicidal ideas. The patient is not nervous/anxious.     Blood pressure 122/70, height 5\' 5"  (1.651 m), weight 215 lb 3.2 oz (97.6 kg), last menstrual period 07/04/2020. Physical Exam Vitals and nursing note reviewed.  Constitutional:      Appearance: She is well-developed.  HENT:     Head: Normocephalic and atraumatic.  Cardiovascular:     Rate and Rhythm: Normal rate and regular rhythm.  Pulmonary:     Effort: Pulmonary effort is normal.     Breath sounds: Normal breath sounds.  Abdominal:     General: Bowel sounds are normal.  Palpations: Abdomen is soft.  Musculoskeletal:        General: Normal range of motion.  Skin:    General: Skin is warm and dry.  Neurological:     Mental Status: She is alert and oriented to person, place, and time.  Psychiatric:        Behavior: Behavior normal.        Thought Content: Thought content normal.        Judgment: Judgment normal.    FHR: 140s  Assessment/Plan  25 y.o. G1P0 at [redacted]w[redacted]d with baby in the breech position who desires an elective primary cesarean section.   Discussed risks, benefits, and alternatives with the patient for the cesarean section.  Discussed the risk of infection bleeding and damage to  surrounding pelvic tissues including but not limited to the uterus, fallopian tubes, ovaries, bowel, and bladder.  Discussed proper care for the incision postoperatively to avoid infection and signs and symptoms of infection to watch for.  Discussed possibility of an emergent blood transfusion for heavy blood loss.  Discussed risks associated with blood transfusion including transfusion reaction and chronic infections, or sepsis which could lead to death.  Patient gave consent for the procedure and blood transfusion if needed. Consent forms were completed.  More than 20 minutes were spent face to face with the patient in the room, reviewing the medical record, labs and images, and coordinating care for the patient. The plan of management was discussed in detail and counseling was provided.    Homero Fellers, MD 03/21/2021, 4:09 PM

## 2021-03-21 NOTE — H&P (View-Only) (Signed)
Kathryn Estrada is an 25 y.o. female.   Chief Complaint: Breech Presentation HPI: She is feeling well. She has no new complaints. She reports normal fetal movement. She denies leakage of fluid. She denies vaginal bleeding. She denies contractions. Per pregnancy has been complicated by maternal obesity and breech presentation.   pregnancy1 Problems (from 08/29/20 to present)    Problem Noted Resolved   Supervision of other normal pregnancy, antepartum 08/29/2020 by Imagene Riches, CNM No   Overview Addendum 02/14/2021  2:36 PM by Orlie Pollen, CNM      Nursing Staff Provider  Office Location  [facility] Dating   LMP = 9w [place]  Language  English Anatomy US  complete  Flu Vaccine    declines Genetic Screen  Declines   TDaP vaccine   undecided Hgb A1C or  GTT Third trimester : 14  Rhogam   n/a   LAB RESULTS   Feeding Plan  Breast Blood Type O/Positive/-- (09/23 1628)   Contraception  unsure (patient has had negative experience with BC in past) Antibody Negative (09/23 1628)  Circumcision  n/a Rubella 3.17 (09/23 1628)  Pediatrician    RPR Non Reactive (09/23 1628)   Support Person  Dorothea Ogle HBsAg Negative (09/23 1628)   Prenatal Classes  Encouraged HIV Non Reactive (09/23 1628)    Varicella  Immune  BTL Consent  n/a GBS  (For PCN allergy, check sensitivities)        VBAC Consent  n/a Pap  2021 NIL    Hgb Electro   n/a  Covid  Unvaccinated - Covid x2 - last in 05/2020 CF  declined     SMA  declined               Previous Version       History reviewed. No pertinent past medical history.  History reviewed. No pertinent surgical history.  Family History  Problem Relation Age of Onset  . Neurofibromatosis Mother   . Anemia Mother   . Diabetes Other    Social History:  reports that she has been smoking cigarettes and e-cigarettes. She has never used smokeless tobacco. She reports current alcohol use. She reports that she does not use drugs.  Allergies: No Known Allergies  (Not  in a hospital admission)   No results found for this or any previous visit (from the past 48 hour(s)). No results found.  Review of Systems  Constitutional: Negative for chills and fever.  HENT: Negative for congestion, hearing loss and sinus pain.   Respiratory: Negative for cough, shortness of breath and wheezing.   Cardiovascular: Negative for chest pain, palpitations and leg swelling.  Gastrointestinal: Negative for abdominal pain, constipation, diarrhea, nausea and vomiting.  Genitourinary: Negative for dysuria, flank pain, frequency, hematuria and urgency.  Musculoskeletal: Negative for back pain.  Skin: Negative for rash.  Neurological: Negative for dizziness and headaches.  Psychiatric/Behavioral: Negative for suicidal ideas. The patient is not nervous/anxious.     Blood pressure 122/70, height 5\' 5"  (1.651 m), weight 215 lb 3.2 oz (97.6 kg), last menstrual period 07/04/2020. Physical Exam Vitals and nursing note reviewed.  Constitutional:      Appearance: She is well-developed.  HENT:     Head: Normocephalic and atraumatic.  Cardiovascular:     Rate and Rhythm: Normal rate and regular rhythm.  Pulmonary:     Effort: Pulmonary effort is normal.     Breath sounds: Normal breath sounds.  Abdominal:     General: Bowel sounds are normal.  Palpations: Abdomen is soft.  Musculoskeletal:        General: Normal range of motion.  Skin:    General: Skin is warm and dry.  Neurological:     Mental Status: She is alert and oriented to person, place, and time.  Psychiatric:        Behavior: Behavior normal.        Thought Content: Thought content normal.        Judgment: Judgment normal.    FHR: 140s  Assessment/Plan  25 y.o. G1P0 at [redacted]w[redacted]d with baby in the breech position who desires an elective primary cesarean section.   Discussed risks, benefits, and alternatives with the patient for the cesarean section.  Discussed the risk of infection bleeding and damage to  surrounding pelvic tissues including but not limited to the uterus, fallopian tubes, ovaries, bowel, and bladder.  Discussed proper care for the incision postoperatively to avoid infection and signs and symptoms of infection to watch for.  Discussed possibility of an emergent blood transfusion for heavy blood loss.  Discussed risks associated with blood transfusion including transfusion reaction and chronic infections, or sepsis which could lead to death.  Patient gave consent for the procedure and blood transfusion if needed. Consent forms were completed.  More than 20 minutes were spent face to face with the patient in the room, reviewing the medical record, labs and images, and coordinating care for the patient. The plan of management was discussed in detail and counseling was provided.    Homero Fellers, MD 03/21/2021, 4:09 PM

## 2021-03-28 ENCOUNTER — Other Ambulatory Visit: Payer: Self-pay

## 2021-03-28 ENCOUNTER — Other Ambulatory Visit
Admission: RE | Admit: 2021-03-28 | Discharge: 2021-03-28 | Disposition: A | Discharge status: Home / Self Care | Payer: Medicaid Other | Source: Ambulatory Visit | Attending: Obstetrics and Gynecology | Admitting: Obstetrics and Gynecology

## 2021-03-28 HISTORY — DX: Other complications of anesthesia, initial encounter: T88.59XA

## 2021-03-28 HISTORY — DX: Raynaud's syndrome without gangrene: I73.00

## 2021-03-28 NOTE — Patient Instructions (Signed)
Your procedure is scheduled on: Tuesday April 03, 2021. Report to Day Surgery inside [facility]. To find out your arrival time please call [phone] between 1PM - 3PM on Monday April 02, 2021.  Remember: Instructions that are not followed completely may result in serious medical risk,  up to and including death, or upon the discretion of your surgeon and anesthesiologist your  surgery may need to be rescheduled.     _X__ 1. Do not eat food after midnight the night before your procedure.                 No chewing gum or hard candies. You may drink clear liquids up to 2 hours                 before you are scheduled to arrive for your surgery- DO not drink clear                 liquids within 2 hours of the start of your surgery.                 Clear Liquids include:  water, apple juice without pulp, clear Gatorade, G2 or                  Gatorade Zero (avoid Red/Purple/Blue), Black Coffee or Tea (Do not add                 anything to coffee or tea).  __X__2.  On the morning of surgery brush your teeth with toothpaste and water, you                may rinse your mouth with mouthwash if you wish.  Do not swallow any toothpaste of mouthwash.     _X__ 3.  No Alcohol for 24 hours before or after surgery.   _X__ 4.  Do Not Smoke or use e-cigarettes For 24 Hours Prior to Your Surgery.                 Do not use any chewable tobacco products for at least 6 hours prior to                 Surgery.  _X__  5.  Do not use any recreational drugs (marijuana, cocaine, heroin, ecstasy, MDMA or other)                For at least one week prior to your surgery.  Combination of these drugs with anesthesia                May have life threatening results.  __X__6.  Notify your doctor if there is any change in your medical condition      (cold, fever, infections).     Do not wear jewelry, make-up, hairpins, clips or nail polish. Do not wear lotions, powders, or perfumes.  You may wear deodorant. Do not shave 48 hours prior to surgery. Men may shave face and neck. Do not bring valuables to the hospital.    [facility] is not responsible for any belongings or valuables.  Contacts, dentures or bridgework may not be worn into surgery. Leave your suitcase in the car. After surgery it may be brought to your room. For patients admitted to the hospital, discharge time is determined by your treatment team.   Patients discharged the day of surgery will not be allowed to drive home.   Make arrangements for someone to be with you for  the first 24 hours of your Same Day Discharge.   __X__ Take these medicines the morning of surgery with A SIP OF WATER:    1. None   2.   3.   4.  5.  6.  ____ Fleet Enema (as directed)   __X__ Use CHG Soap (or wipes) as directed  ____ Use Benzoyl Peroxide Gel as instructed  ____ Use inhalers on the day of surgery  ____ Stop metformin 2 days prior to surgery    ____ Take 1/2 of usual insulin dose the night before surgery. No insulin the morning          of surgery.   __X__ Stop Anti-inflammatories such as Ibuprofen, Aleve, Advil, naproxen, aspirin and or BC powders.    __X__ Stop supplements until after surgery.    __X__ Do not start any herbal supplements before your procedure.    If you have any questions regarding your pre-procedure instructions,  Please call Pre-admit Testing at [phone].

## 2021-03-29 ENCOUNTER — Ambulatory Visit (INDEPENDENT_AMBULATORY_CARE_PROVIDER_SITE_OTHER): Payer: Medicaid Other | Admitting: Advanced Practice Midwife

## 2021-03-29 ENCOUNTER — Encounter: Payer: Self-pay | Admitting: Advanced Practice Midwife

## 2021-03-29 VITALS — BP 120/80 | Wt 213.0 lb

## 2021-03-29 DIAGNOSIS — Z3483 Encounter for supervision of other normal pregnancy, third trimester: Secondary | ICD-10-CM

## 2021-03-29 DIAGNOSIS — Z3A38 38 weeks gestation of pregnancy: Secondary | ICD-10-CM

## 2021-03-29 LAB — POCT URINALYSIS DIPSTICK OB
Glucose, UA: NEGATIVE
POC,PROTEIN,UA: NEGATIVE

## 2021-03-29 NOTE — Progress Notes (Signed)
Routine Prenatal Care Visit  Subjective  Kathryn Estrada is a 25 y.o. G1P0 at [redacted]w[redacted]d being seen today for ongoing prenatal care.  She is currently monitored for the following issues for this low-risk pregnancy and has Raynaud's phenomenon without gangrene; Prolonged capillary refill time; Bilateral cold feet; Other fatigue; Dysuria; Abnormal thyroid blood test; BMI 29.0-29.9,adult; Tobacco use disorder; B12 deficiency; Supervision of other normal pregnancy, antepartum; and Breech presentation, no version on their problem list.  ----------------------------------------------------------------------------------- Patient reports constipation- ongoing. Currently it has been 1 week since she had a BM. She has tried colace, miralax, prune juice, enema, suppository (most relief with suppository). We discussed other bowel health components and recommended treatments.  Safe medications reviewed. ER for disimpaction if needed. Contractions: Not present. Vag. Bleeding: None.  Movement: Present. Leaking Fluid denies.  ----------------------------------------------------------------------------------- The following portions of the patient's history were reviewed and updated as appropriate: allergies, current medications, past family history, past medical history, past social history, past surgical history and problem list. Problem list updated.  Objective  Blood pressure 120/80, weight 213 lb (96.6 kg), last menstrual period 07/04/2020. Pregravid weight 180 lb (81.6 kg) Total Weight Gain 33 lb (15 kg) Urinalysis: Urine Protein    Urine Glucose    Fetal Status: Fetal Heart Rate (bpm): 147   Movement: Present     General:  Alert, oriented and cooperative. Patient is in no acute distress.  Skin: Skin is warm and dry. No rash noted.   Cardiovascular: Normal heart rate noted  Respiratory: Normal respiratory effort, no problems with respiration noted  Abdomen: Soft, gravid, appropriate for gestational age.  Pain/Pressure: Present     Pelvic:  Cervical exam deferred        Extremities: Normal range of motion.  Edema: None  Mental Status: Normal mood and affect. Normal behavior. Normal judgment and thought content.   Assessment   25 y.o. G1P0 at [redacted]w[redacted]d by  04/10/2021, by Last Menstrual Period presenting for routine prenatal visit  Plan   pregnancy1 Problems (from 08/29/20 to present)    Problem Noted Resolved   Supervision of other normal pregnancy, antepartum 08/29/2020 by Imagene Riches, CNM No   Overview Addendum 02/14/2021  2:36 PM by Orlie Pollen, CNM      Nursing Staff Provider  Office Location  [facility] Dating   LMP = 9w [place]  Language  English Anatomy US  complete  Flu Vaccine    declines Genetic Screen  Declines   TDaP vaccine   undecided Hgb A1C or  GTT Third trimester : 12  Rhogam   n/a   LAB RESULTS   Feeding Plan  Breast Blood Type O/Positive/-- (09/23 1628)   Contraception  unsure (patient has had negative experience with [facility] in past) Antibody Negative (09/23 1628)  Circumcision  n/a Rubella 3.17 (09/23 1628)  Pediatrician    RPR Non Reactive (09/23 1628)   Support Person  Dorothea Ogle HBsAg Negative (09/23 1628)   Prenatal Classes  Encouraged HIV Non Reactive (09/23 1628)    Varicella  Immune  BTL Consent  n/a GBS  (For PCN allergy, check sensitivities)        VBAC Consent  n/a Pap  2021 NIL    Hgb Electro   n/a  Covid  Unvaccinated - Covid x2 - last in 05/2020 CF  declined     SMA  declined               Previous Version       Term labor symptoms  and general obstetric precautions including but not limited to vaginal bleeding, contractions, leaking of fluid and fetal movement were reviewed in detail with the patient. Please refer to After Visit Summary for other counseling recommendations.   Return for c/section 4/12.  Rod Can, CNM 03/29/2021 3:29 PM

## 2021-03-29 NOTE — Addendum Note (Signed)
Addended by: Quintella Baton D on: 03/29/2021 03:45 PM   Modules accepted: Orders

## 2021-03-29 NOTE — Patient Instructions (Signed)

## 2021-04-02 ENCOUNTER — Encounter
Admission: RE | Admit: 2021-04-02 | Discharge: 2021-04-02 | Disposition: A | Discharge status: Home / Self Care | Payer: 59 | Source: Ambulatory Visit | Attending: Obstetrics and Gynecology | Admitting: Obstetrics and Gynecology

## 2021-04-02 ENCOUNTER — Other Ambulatory Visit: Payer: Medicaid Other

## 2021-04-02 ENCOUNTER — Other Ambulatory Visit: Payer: Self-pay

## 2021-04-02 ENCOUNTER — Telehealth: Payer: Self-pay

## 2021-04-02 DIAGNOSIS — Z20822 Contact with and (suspected) exposure to covid-19: Secondary | ICD-10-CM | POA: Insufficient documentation

## 2021-04-02 DIAGNOSIS — Z01812 Encounter for preprocedural laboratory examination: Secondary | ICD-10-CM | POA: Insufficient documentation

## 2021-04-02 LAB — SARS CORONAVIRUS 2 (TAT 6-24 HRS): SARS Coronavirus 2: NEGATIVE

## 2021-04-02 LAB — CBC
HCT: 34.2 % — ABNORMAL LOW (ref 36.0–46.0)
Hemoglobin: 11.4 g/dL — ABNORMAL LOW (ref 12.0–15.0)
MCH: 27.9 pg (ref 26.0–34.0)
MCHC: 33.3 g/dL (ref 30.0–36.0)
MCV: 83.6 fL (ref 80.0–100.0)
Platelets: 168 10*3/uL (ref 150–400)
RBC: 4.09 MIL/uL (ref 3.87–5.11)
RDW: 13.2 % (ref 11.5–15.5)
WBC: 10.5 10*3/uL (ref 4.0–10.5)
nRBC: 0 % (ref 0.0–0.2)

## 2021-04-02 LAB — TYPE AND SCREEN
ABO/RH(D): O POS
Antibody Screen: NEGATIVE
Extend sample reason: UNDETERMINED

## 2021-04-02 LAB — RAPID HIV SCREEN (HIV 1/2 AB+AG)
HIV 1/2 Antibodies: NONREACTIVE
HIV-1 P24 Antigen - HIV24: NONREACTIVE

## 2021-04-02 NOTE — Telephone Encounter (Signed)
Pt called after hour nurse 03/31/21 7:37pm c/o sched for c/s Tues; believes she may be having ctxs c low back pain and tightness in abd; is [redacted]w[redacted]d; low back pain and ctxs started last night; decreased FM; no vaginal bleeding or gushes of fluid.  [id number]  Per SDJ, he is okay with pat doing a kick count and timing ctxs at home for the next hour; if pt is concerned after timing ctxs and baby movements, pt should report to L&D for eval; pt to watch for less than 4 kicks in one hour of 10 kicks in 2 hrs, any vaginal bleeding, leakage of fluid, new or worsening sxs.  Pt scheduled for pre-op labs today and c/s tomorrow.

## 2021-04-03 ENCOUNTER — Encounter: Payer: Self-pay | Admitting: Obstetrics and Gynecology

## 2021-04-03 ENCOUNTER — Inpatient Hospital Stay: Admission: RE | Admit: 2021-04-03 | Payer: 59 | Source: Home / Self Care | Admitting: Obstetrics and Gynecology

## 2021-04-03 ENCOUNTER — Inpatient Hospital Stay: Payer: 59 | Admitting: Anesthesiology

## 2021-04-03 ENCOUNTER — Encounter
Admission: RE | Disposition: A | Discharge status: Home / Self Care | Payer: Self-pay | Source: Home / Self Care | Attending: Obstetrics and Gynecology

## 2021-04-03 ENCOUNTER — Inpatient Hospital Stay
Admission: RE | Admit: 2021-04-03 | Discharge: 2021-04-05 | DRG: 787 | Disposition: A | Discharge status: Home / Self Care | Payer: 59 | Attending: Obstetrics and Gynecology | Admitting: Obstetrics and Gynecology

## 2021-04-03 DIAGNOSIS — O9081 Anemia of the puerperium: Secondary | ICD-10-CM | POA: Diagnosis not present

## 2021-04-03 DIAGNOSIS — O99334 Smoking (tobacco) complicating childbirth: Secondary | ICD-10-CM | POA: Diagnosis present

## 2021-04-03 DIAGNOSIS — D62 Acute posthemorrhagic anemia: Secondary | ICD-10-CM | POA: Diagnosis not present

## 2021-04-03 DIAGNOSIS — Z3480 Encounter for supervision of other normal pregnancy, unspecified trimester: Secondary | ICD-10-CM

## 2021-04-03 DIAGNOSIS — F17210 Nicotine dependence, cigarettes, uncomplicated: Secondary | ICD-10-CM | POA: Diagnosis present

## 2021-04-03 DIAGNOSIS — Z3A39 39 weeks gestation of pregnancy: Secondary | ICD-10-CM

## 2021-04-03 DIAGNOSIS — O99214 Obesity complicating childbirth: Secondary | ICD-10-CM | POA: Diagnosis present

## 2021-04-03 DIAGNOSIS — Z20822 Contact with and (suspected) exposure to covid-19: Secondary | ICD-10-CM | POA: Diagnosis present

## 2021-04-03 DIAGNOSIS — O321XX0 Maternal care for breech presentation, not applicable or unspecified: Principal | ICD-10-CM | POA: Diagnosis present

## 2021-04-03 LAB — ABO/RH: ABO/RH(D): O POS

## 2021-04-03 LAB — RPR: RPR Ser Ql: NONREACTIVE

## 2021-04-03 SURGERY — Surgical Case
Anesthesia: Spinal

## 2021-04-03 MED ORDER — PRENATAL MULTIVITAMIN CH
1.0000 | ORAL_TABLET | Freq: Every day | ORAL | Status: DC
  Administered 2021-04-04 – 2021-04-05 (×2): 1 via ORAL
  Filled 2021-04-03 (×2): qty 1

## 2021-04-03 MED ORDER — ACETAMINOPHEN 325 MG PO TABS
650.0000 mg | ORAL_TABLET | Freq: Four times a day (QID) | ORAL | Status: AC
  Administered 2021-04-03 – 2021-04-04 (×4): 650 mg via ORAL
  Filled 2021-04-03 (×4): qty 2

## 2021-04-03 MED ORDER — OXYTOCIN-SODIUM CHLORIDE 30-0.9 UT/500ML-% IV SOLN
INTRAVENOUS | Status: AC
  Filled 2021-04-03: qty 500

## 2021-04-03 MED ORDER — ORAL CARE MOUTH RINSE: 15.0000 mL | Freq: Once | OROMUCOSAL | Status: DC

## 2021-04-03 MED ORDER — BUPIVACAINE 0.25 % ON-Q PUMP DUAL CATH 400 ML
400.0000 mL | INJECTION | Status: DC
  Filled 2021-04-03: qty 400

## 2021-04-03 MED ORDER — CEFAZOLIN SODIUM-DEXTROSE 2-4 GM/100ML-% IV SOLN
INTRAVENOUS | Status: AC
  Filled 2021-04-03: qty 100

## 2021-04-03 MED ORDER — KETOROLAC TROMETHAMINE 30 MG/ML IJ SOLN
30.0000 mg | Freq: Four times a day (QID) | INTRAMUSCULAR | Status: AC
  Administered 2021-04-03 – 2021-04-04 (×4): 30 mg via INTRAVENOUS
  Filled 2021-04-03 (×4): qty 1

## 2021-04-03 MED ORDER — LACTATED RINGERS IV SOLN: Freq: Once | INTRAVENOUS | Status: DC

## 2021-04-03 MED ORDER — ZOLPIDEM TARTRATE 5 MG PO TABS: 5.0000 mg | ORAL_TABLET | Freq: Every evening | ORAL | Status: DC | PRN

## 2021-04-03 MED ORDER — FENTANYL CITRATE (PF) 100 MCG/2ML IJ SOLN
INTRAMUSCULAR | Status: DC | PRN
  Administered 2021-04-03: 15 ug via INTRATHECAL

## 2021-04-03 MED ORDER — DIPHENHYDRAMINE HCL 50 MG/ML IJ SOLN: 12.5000 mg | INTRAMUSCULAR | Status: DC | PRN

## 2021-04-03 MED ORDER — SENNOSIDES-DOCUSATE SODIUM 8.6-50 MG PO TABS
2.0000 | ORAL_TABLET | ORAL | Status: DC
  Administered 2021-04-04 – 2021-04-05 (×2): 2 via ORAL
  Filled 2021-04-03 (×3): qty 2

## 2021-04-03 MED ORDER — FENTANYL CITRATE (PF) 100 MCG/2ML IJ SOLN
INTRAMUSCULAR | Status: AC
  Filled 2021-04-03: qty 2

## 2021-04-03 MED ORDER — DIBUCAINE (PERIANAL) 1 % EX OINT: 1.0000 "application " | TOPICAL_OINTMENT | CUTANEOUS | Status: DC | PRN

## 2021-04-03 MED ORDER — BUPIVACAINE HCL (PF) 0.5 % IJ SOLN
INTRAMUSCULAR | Status: DC | PRN
  Administered 2021-04-03: 10 mL

## 2021-04-03 MED ORDER — FLEET ENEMA 7-19 GM/118ML RE ENEM: 1.0000 | ENEMA | Freq: Every day | RECTAL | Status: DC | PRN

## 2021-04-03 MED ORDER — DIPHENHYDRAMINE HCL 25 MG PO CAPS: 25.0000 mg | ORAL_CAPSULE | ORAL | Status: DC | PRN

## 2021-04-03 MED ORDER — NALBUPHINE HCL 10 MG/ML IJ SOLN
5.0000 mg | Freq: Once | INTRAMUSCULAR | Status: DC | PRN
Start: 2021-04-03 — End: 2021-04-05

## 2021-04-03 MED ORDER — MORPHINE SULFATE (PF) 0.5 MG/ML IJ SOLN
INTRAMUSCULAR | Status: DC | PRN
  Administered 2021-04-03: .1 mg via INTRATHECAL

## 2021-04-03 MED ORDER — CHLORHEXIDINE GLUCONATE 0.12 % MT SOLN: 15.0000 mL | Freq: Once | OROMUCOSAL | Status: DC

## 2021-04-03 MED ORDER — MEPERIDINE HCL 25 MG/ML IJ SOLN: 6.2500 mg | INTRAMUSCULAR | Status: DC | PRN

## 2021-04-03 MED ORDER — SIMETHICONE 80 MG PO CHEW
80.0000 mg | CHEWABLE_TABLET | Freq: Three times a day (TID) | ORAL | Status: DC
  Administered 2021-04-04 – 2021-04-05 (×5): 80 mg via ORAL
  Filled 2021-04-03 (×5): qty 1

## 2021-04-03 MED ORDER — WITCH HAZEL-GLYCERIN EX PADS: 1.0000 "application " | MEDICATED_PAD | CUTANEOUS | Status: DC | PRN

## 2021-04-03 MED ORDER — KETOROLAC TROMETHAMINE 30 MG/ML IJ SOLN: 30.0000 mg | Freq: Four times a day (QID) | INTRAMUSCULAR | Status: AC

## 2021-04-03 MED ORDER — NALBUPHINE HCL 10 MG/ML IJ SOLN: 5.0000 mg | INTRAMUSCULAR | Status: DC | PRN

## 2021-04-03 MED ORDER — KETOROLAC TROMETHAMINE 30 MG/ML IJ SOLN
INTRAMUSCULAR | Status: AC
  Filled 2021-04-03: qty 1

## 2021-04-03 MED ORDER — OXYTOCIN-SODIUM CHLORIDE 30-0.9 UT/500ML-% IV SOLN
2.5000 [IU]/h | INTRAVENOUS | Status: AC
  Administered 2021-04-03: 2.5 [IU]/h via INTRAVENOUS

## 2021-04-03 MED ORDER — OXYCODONE HCL 5 MG PO TABS: 10.0000 mg | ORAL_TABLET | ORAL | Status: DC | PRN

## 2021-04-03 MED ORDER — BUPIVACAINE ON-Q PAIN PUMP (FOR ORDER SET NO CHG)
INJECTION | Status: DC
  Filled 2021-04-03: qty 1

## 2021-04-03 MED ORDER — SIMETHICONE 80 MG PO CHEW: 80.0000 mg | CHEWABLE_TABLET | ORAL | Status: DC | PRN

## 2021-04-03 MED ORDER — DIPHENHYDRAMINE HCL 25 MG PO CAPS: 25.0000 mg | ORAL_CAPSULE | Freq: Four times a day (QID) | ORAL | Status: DC | PRN

## 2021-04-03 MED ORDER — FAMOTIDINE 20 MG PO TABS
20.0000 mg | ORAL_TABLET | Freq: Once | ORAL | Status: AC
  Administered 2021-04-03: 20 mg via ORAL
  Filled 2021-04-03: qty 1

## 2021-04-03 MED ORDER — FERROUS SULFATE 325 (65 FE) MG PO TABS
325.0000 mg | ORAL_TABLET | Freq: Two times a day (BID) | ORAL | Status: DC
  Administered 2021-04-04 – 2021-04-05 (×3): 325 mg via ORAL
  Filled 2021-04-03 (×4): qty 1

## 2021-04-03 MED ORDER — KETOROLAC TROMETHAMINE 30 MG/ML IJ SOLN
INTRAMUSCULAR | Status: DC | PRN
  Administered 2021-04-03: 30 mg via INTRAVENOUS

## 2021-04-03 MED ORDER — IBUPROFEN 800 MG PO TABS
800.0000 mg | ORAL_TABLET | Freq: Three times a day (TID) | ORAL | Status: DC
  Administered 2021-04-04 – 2021-04-05 (×3): 800 mg via ORAL
  Filled 2021-04-03 (×3): qty 1

## 2021-04-03 MED ORDER — SODIUM CHLORIDE 0.9% FLUSH: 3.0000 mL | INTRAVENOUS | Status: DC | PRN

## 2021-04-03 MED ORDER — ONDANSETRON HCL 4 MG/2ML IJ SOLN
INTRAMUSCULAR | Status: DC | PRN
  Administered 2021-04-03: 4 mg via INTRAVENOUS

## 2021-04-03 MED ORDER — MENTHOL 3 MG MT LOZG
1.0000 | LOZENGE | OROMUCOSAL | Status: DC | PRN
  Filled 2021-04-03: qty 9

## 2021-04-03 MED ORDER — BUPIVACAINE HCL (PF) 0.5 % IJ SOLN
INTRAMUSCULAR | Status: AC
  Filled 2021-04-03: qty 30

## 2021-04-03 MED ORDER — NALBUPHINE HCL 10 MG/ML IJ SOLN: 5.0000 mg | Freq: Once | INTRAMUSCULAR | Status: DC | PRN

## 2021-04-03 MED ORDER — LACTATED RINGERS IV SOLN: INTRAVENOUS | Status: DC

## 2021-04-03 MED ORDER — BUPIVACAINE IN DEXTROSE 0.75-8.25 % IT SOLN
INTRATHECAL | Status: DC | PRN
  Administered 2021-04-03: 1.6 mL via INTRATHECAL

## 2021-04-03 MED ORDER — NALOXONE HCL 4 MG/10ML IJ SOLN
1.0000 ug/kg/h | INTRAVENOUS | Status: DC | PRN
  Filled 2021-04-03: qty 5

## 2021-04-03 MED ORDER — OXYCODONE HCL 5 MG PO TABS
5.0000 mg | ORAL_TABLET | ORAL | Status: DC | PRN
Start: 2021-04-03 — End: 2021-04-05
  Administered 2021-04-04 – 2021-04-05 (×4): 5 mg via ORAL
  Filled 2021-04-03 (×5): qty 1

## 2021-04-03 MED ORDER — OXYTOCIN-SODIUM CHLORIDE 30-0.9 UT/500ML-% IV SOLN
INTRAVENOUS | Status: DC | PRN
  Administered 2021-04-03: 30 [IU] via INTRAVENOUS

## 2021-04-03 MED ORDER — ONDANSETRON HCL 4 MG/2ML IJ SOLN: 4.0000 mg | Freq: Three times a day (TID) | INTRAMUSCULAR | Status: DC | PRN

## 2021-04-03 MED ORDER — SOD CITRATE-CITRIC ACID 500-334 MG/5ML PO SOLN: 30.0000 mL | ORAL | Status: DC

## 2021-04-03 MED ORDER — OXYCODONE HCL 5 MG PO TABS: 5.0000 mg | ORAL_TABLET | ORAL | Status: DC | PRN

## 2021-04-03 MED ORDER — SOD CITRATE-CITRIC ACID 500-334 MG/5ML PO SOLN
ORAL | Status: AC
  Filled 2021-04-03: qty 15

## 2021-04-03 MED ORDER — COCONUT OIL OIL
1.0000 "application " | TOPICAL_OIL | Status: DC | PRN
  Filled 2021-04-03: qty 120

## 2021-04-03 MED ORDER — BISACODYL 10 MG RE SUPP: 10.0000 mg | Freq: Every day | RECTAL | Status: DC | PRN

## 2021-04-03 MED ORDER — NALOXONE HCL 0.4 MG/ML IJ SOLN: 0.4000 mg | INTRAMUSCULAR | Status: DC | PRN

## 2021-04-03 MED ORDER — MORPHINE SULFATE (PF) 0.5 MG/ML IJ SOLN
INTRAMUSCULAR | Status: AC
  Filled 2021-04-03: qty 10

## 2021-04-03 MED ORDER — CEFAZOLIN SODIUM-DEXTROSE 2-4 GM/100ML-% IV SOLN
2.0000 g | INTRAVENOUS | Status: AC
  Administered 2021-04-03: 2 g via INTRAVENOUS

## 2021-04-03 MED ORDER — SODIUM CHLORIDE 0.9 % IV SOLN
INTRAVENOUS | Status: DC | PRN
  Administered 2021-04-03: 50 ug/min via INTRAVENOUS

## 2021-04-03 MED ORDER — HYDROMORPHONE HCL 1 MG/ML IJ SOLN: 0.2000 mg | INTRAMUSCULAR | Status: DC | PRN

## 2021-04-03 SURGICAL SUPPLY — 29 items
CANISTER SUCT 3000ML PPV (MISCELLANEOUS) ×2 IMPLANT
CHLORAPREP W/TINT 26 (MISCELLANEOUS) ×4 IMPLANT
CLOSURE STERI STRIP 1/2 X4 (GAUZE/BANDAGES/DRESSINGS) ×2 IMPLANT
DERMABOND ADHESIVE PROPEN (GAUZE/BANDAGES/DRESSINGS) ×1
DERMABOND ADVANCED (GAUZE/BANDAGES/DRESSINGS) ×1
DERMABOND ADVANCED .7 DNX12 (GAUZE/BANDAGES/DRESSINGS) ×1 IMPLANT
DERMABOND ADVANCED .7 DNX6 (GAUZE/BANDAGES/DRESSINGS) ×1 IMPLANT
DRSG OPSITE POSTOP 4X10 (GAUZE/BANDAGES/DRESSINGS) ×2 IMPLANT
ELECT CAUTERY BLADE 6.4 (BLADE) ×2 IMPLANT
ELECT REM PT RETURN 9FT ADLT (ELECTROSURGICAL) ×2
ELECTRODE REM PT RTRN 9FT ADLT (ELECTROSURGICAL) ×1 IMPLANT
GLOVE SURG UNDER POLY LF SZ6.5 (GLOVE) ×4 IMPLANT
GOWN STRL REUS W/ TWL LRG LVL3 (GOWN DISPOSABLE) ×1 IMPLANT
GOWN STRL REUS W/ TWL XL LVL3 (GOWN DISPOSABLE) ×2 IMPLANT
GOWN STRL REUS W/TWL LRG LVL3 (GOWN DISPOSABLE) ×1
GOWN STRL REUS W/TWL XL LVL3 (GOWN DISPOSABLE) ×2
MANIFOLD NEPTUNE II (INSTRUMENTS) ×2 IMPLANT
MAT PREVALON FULL STRYKER (MISCELLANEOUS) ×2 IMPLANT
NS IRRIG 1000ML POUR BTL (IV SOLUTION) ×2 IMPLANT
PACK C SECTION AR (MISCELLANEOUS) ×2 IMPLANT
PAD OB MATERNITY 4.3X12.25 (PERSONAL CARE ITEMS) ×2 IMPLANT
PAD PREP 24X41 OB/GYN DISP (PERSONAL CARE ITEMS) ×2 IMPLANT
PENCIL SMOKE EVACUATOR (MISCELLANEOUS) ×2 IMPLANT
SUCT VACUUM KIWI BELL (SUCTIONS) ×2 IMPLANT
SUT MNCRL AB 4-0 PS2 18 (SUTURE) ×2 IMPLANT
SUT PLAIN 3-0 (SUTURE) ×2 IMPLANT
SUT VIC AB 0 CT1 36 (SUTURE) ×6 IMPLANT
SUT VIC AB 2-0 CT1 36 (SUTURE) ×2 IMPLANT
SYR 30ML LL (SYRINGE) ×4 IMPLANT

## 2021-04-03 NOTE — Anesthesia Preprocedure Evaluation (Signed)
Anesthesia Evaluation  Patient identified by MRN, date of birth, ID band Patient awake    Reviewed: Allergy & Precautions, NPO status , Patient's Chart, lab work & pertinent test results  History of Anesthesia Complications Negative for: history of anesthetic complications  Airway Mallampati: II  TM Distance: >3 FB Neck ROM: Full    Dental no notable dental hx.    Pulmonary neg sleep apnea, neg COPD, former smoker,    breath sounds clear to auscultation- rhonchi (-) wheezing      Cardiovascular Exercise Tolerance: Good (-) hypertension(-) CAD, (-) Past MI, (-) Cardiac Stents and (-) CABG  Rhythm:Regular Rate:Normal - Systolic murmurs and - Diastolic murmurs    Neuro/Psych neg Seizures negative neurological ROS  negative psych ROS   GI/Hepatic negative GI ROS, Neg liver ROS,   Endo/Other  negative endocrine ROSneg diabetes  Renal/GU negative Renal ROS     Musculoskeletal negative musculoskeletal ROS (+)   Abdominal (+) + obese,   Peds  Hematology negative hematology ROS (+)   Anesthesia Other Findings Past Medical History: No date: Complication of anesthesia     Comment:  needs a good amount of anesthesia to get numb No date: Raynaud's syndrome without gangrene   Reproductive/Obstetrics (+) Pregnancy                             Lab Results  Component Value Date   WBC 10.5 04/02/2021   HGB 11.4 (L) 04/02/2021   HCT 34.2 (L) 04/02/2021   MCV 83.6 04/02/2021   PLT 168 04/02/2021    Anesthesia Physical Anesthesia Plan  ASA: II  Anesthesia Plan: Spinal   Post-op Pain Management:    Induction:   PONV Risk Score and Plan: 2 and Ondansetron  Airway Management Planned: Natural Airway  Additional Equipment:   Intra-op Plan:   Post-operative Plan:   Informed Consent: I have reviewed the patients History and Physical, chart, labs and discussed the procedure including the  risks, benefits and alternatives for the proposed anesthesia with the patient or authorized representative who has indicated his/her understanding and acceptance.     Dental advisory given  Plan Discussed with: CRNA and Anesthesiologist  Anesthesia Plan Comments: (Scheduled cesarean delivery for breech presentation, found to be cephalic on day of surgery, pt electing to continue with cesarean delivery rather than induction)        Anesthesia Quick Evaluation

## 2021-04-03 NOTE — Interval H&P Note (Signed)
History and Physical Interval Note:  Patient was scanned at bedside. Fetal position had changed to vertex. Reviewed options with the patient of discharge home, induction of labor, or elective cesarean. We discussed that most people who attempt a vaginal delivery will be successful. Kathryn Estrada has had a lot of anxiety around labor process. She considered her options and elected to proceed with cesarean delivery.    04/03/2021 7:50 AM  Kathryn Estrada  has presented today for surgery, with the diagnosis of Breech presentation in pregnancy.  The various methods of treatment have been discussed with the patient and family. After consideration of risks, benefits and other options for treatment, the patient has consented to  Procedure(s): CESAREAN SECTION (N/A) as a surgical intervention.  The patient's history has been reviewed, patient examined, no change in status, stable for surgery.  I have reviewed the patient's chart and labs.  Questions were answered to the patient's satisfaction.     Malo

## 2021-04-03 NOTE — Progress Notes (Signed)
Pt arrived to L&D for scheduled c/s. Admission complete, consents verified, IV placed, reactive NST category I.

## 2021-04-03 NOTE — Transfer of Care (Signed)
Immediate Anesthesia Transfer of Care Note  Patient: Kathryn Estrada  Procedure(s) Performed: CESAREAN SECTION (N/A )  Patient Location: PACU and Mother/Baby  Anesthesia Type:Spinal  Level of Consciousness: awake, alert  and oriented  Airway & Oxygen Therapy: Patient Spontanous Breathing  Post-op Assessment: Report given to RN and Post -op Vital signs reviewed and stable  Post vital signs: Reviewed and stable  Last Vitals:  Vitals Value Taken Time  BP 116/68 04/03/2021 0925  Temp    Pulse 103   Resp 18   SpO2 99     Last Pain:  Vitals:   04/03/21 0721  TempSrc:   PainSc: 0-No pain         Complications: No complications documented.

## 2021-04-03 NOTE — Lactation Note (Signed)
This note was copied from a baby's chart. Lactation Consultation Note  Patient Name: [name] [id number] Date: 04/03/2021   Age:25 hours  10:00- Lactation at bedside in L&D post c-section delivery for G1P1 mom. Baby is at breast, position/aligned by Transition RN. For 15 minutes baby was on/off the breast, LC had full hold of baby and breast, massaging/compression, and re-latching baby.  During a break with breastfeeding attempt, LC attempted to teach hand expression on opposite breast (left). Mom's breast did feel harder, and mom is more sensitive to this side. LC focused on opposite breast for remainder of feed.  With baby popping on and off, LC suggested use of nipple shield to see if baby could sustain latch for a longer period. Size 47mm NS placed, baby accepted easily and sustained latch for additional 15 minutes; colostrum noted in shield when feeding ended.  12:00pm- LC called to rm [phone] to assist/observe feeding attempt. RN at bedside who assisted with position and latch on Left breast. Mom reports discomfort/pain, with baby at the breast. LC observes latch- wide mouth, flanged lips, and rhythmic suck pattern observed, but mom continues to note discomfort/pain. Alternate position (football) tried, mom continued to have pain/discomfort.   Options reviewed with parents: Encouraged continuing to feed with cues, trying different positions, pumping/ice pack for left breast to soften/lower the swelling, skin to skin with warm blanket to soothe/relax.  Reviewed newborn stomach size, feeding patterns and expectations, early cues, hand expression, and output expectations.  Whiteboard updated with LC name/number and RN updated on feeding plan.   Maternal Data    Feeding    LATCH Score                    Lactation Tools Discussed/Used    Interventions    Discharge    Consult Status      Kathryn Estrada 04/03/2021, 2:01 PM

## 2021-04-03 NOTE — Anesthesia Procedure Notes (Signed)
Spinal  Patient location during procedure: OR Start time: 04/03/2021 8:08 AM End time: 04/03/2021 8:12 AM Reason for block: surgical anesthesia Staffing Performed: resident/CRNA  Anesthesiologist: Emmie Niemann, MD Resident/CRNA: Jerrye Noble, CRNA Preanesthetic Checklist Completed: patient identified, IV checked, site marked, risks and benefits discussed, surgical consent, monitors and equipment checked, pre-op evaluation and timeout performed Spinal Block Patient position: sitting Prep: ChloraPrep Patient monitoring: heart rate, continuous pulse ox and blood pressure Approach: midline Location: L3-4 Injection technique: single-shot Needle Needle type: Introducer and Pencil-Tip  Needle gauge: 24 G Needle length: 9 cm Assessment Sensory level: T4 Events: CSF return

## 2021-04-03 NOTE — Op Note (Addendum)
Cesarean Section Procedure Note 04/03/21  Pre-operative Diagnosis:  1. [redacted] week gestation Post-operative Diagnosis: same, delivered. Procedure: Primary Low Transverse Cesarean Section   Surgeon: Adrian Prows MD   Assistant(s): Prentice Docker MD - No other skilled surgical assistant available. Anesthesia: Spinal Estimated Blood Loss: 681 cc Complications: None; patient tolerated the procedure well.   Disposition: PACU - hemodynamically stable. Condition: stable   Findings: A female infant in the cephalic presentation. Amniotic fluid - clear   Birth weight: 7 lbs 11oz Apgars of 8 and 9.  Intact placenta with a three-vessel cord. Grossly normal uterus, tubes and ovaries bilaterally. No intraabdominal adhesions were noted.   Procedure Details    The patient was taken to operating room, identified as the correct patient and the procedure verified as C-Section Delivery. A time out was held and the above information confirmed. After induction of anesthesia, the patient was draped and prepped in the usual sterile manner. A Pfannenstiel incision was made and carried down through the subcutaneous tissue to the fascia. Fascial incision was made and extended transversely with the Mayo scissors. The fascia was separated from the underlying rectus tissue superiorly and inferiorly. The peritoneum was identified and entered bluntly. Peritoneal incision was extended longitudinally. A low transverse hysterotomy was made. The fetus was delivered atraumatically with assistance of the flat kiwi vacuum. The umbilical cord was clamped x 2 and cut and the infant was handed to the awaiting pediatricians. The placenta was removed intact and appeared normal with a 3-vessel cord. The uterus was exteriorized and cleared of all clot and debris. The hysterotomy was closed with running sutures of 0 Vicryl suture. A second imbricating layer was placed with the same suture. Excellent hemostasis was observed.  The  uterus was returned to the abdomen. The pelvis was inspected and again, excellent hemostasis was noted. The peritoneum was closed with a running stitch of 2-0 Vicryl. The On Q Pain pump System was then placed.  Trocars were placed through the abdominal wall into the subfascial space and these were used to thread the silver soaker cathaters into place.The rectus muscles were inspected and were hemostatic. The rectus fascia was then reapproximated with running sutures of 0-vicryl, with careful placement not to incorporate the cathaters. Subcutaneous tissues are then irrigated with saline and hemostasis assured with the bovie. The subcutaneous fat was approximated with 3-0 plain and a running stitch.  The skin was closed with 4-0 monocryl suture in a subcuticular fashion followed by skin adhesive. The cathaters are flushed each with 5 mL of Bupivicaine and stabilized into place with dressing. Instrument, sponge, and needle counts were correct prior to the abdominal closure and at the conclusion of the case.  The patient tolerated the procedure well and was transferred to the recovery room in stable condition.   Dr. Glennon Mac assisted with this case. This was a high level case requiring a Physicist, medical. No other assistant was readily available. He assisted with retraction of tissue and application of fundal pressure during the case.     Homero Fellers MD [facility] 04/03/21 9:31 AM

## 2021-04-04 ENCOUNTER — Encounter: Payer: Self-pay | Admitting: Obstetrics and Gynecology

## 2021-04-04 LAB — CBC
HCT: 28 % — ABNORMAL LOW (ref 36.0–46.0)
Hemoglobin: 9 g/dL — ABNORMAL LOW (ref 12.0–15.0)
MCH: 27.3 pg (ref 26.0–34.0)
MCHC: 32.1 g/dL (ref 30.0–36.0)
MCV: 84.8 fL (ref 80.0–100.0)
Platelets: 140 10*3/uL — ABNORMAL LOW (ref 150–400)
RBC: 3.3 MIL/uL — ABNORMAL LOW (ref 3.87–5.11)
RDW: 13.2 % (ref 11.5–15.5)
WBC: 11 10*3/uL — ABNORMAL HIGH (ref 4.0–10.5)
nRBC: 0 % (ref 0.0–0.2)

## 2021-04-04 MED ORDER — POLYETHYLENE GLYCOL 3350 17 G PO PACK
17.0000 g | PACK | Freq: Every day | ORAL | Status: DC
  Administered 2021-04-04 – 2021-04-05 (×2): 17 g via ORAL
  Filled 2021-04-04 (×3): qty 1

## 2021-04-04 MED ORDER — ACETAMINOPHEN 325 MG PO TABS
650.0000 mg | ORAL_TABLET | Freq: Four times a day (QID) | ORAL | Status: DC | PRN
  Administered 2021-04-04 – 2021-04-05 (×3): 650 mg via ORAL
  Filled 2021-04-04 (×3): qty 2

## 2021-04-04 NOTE — Anesthesia Post-op Follow-up Note (Signed)
  Anesthesia Pain Follow-up Note  Patient: Kathryn Estrada  Day #: 1  Date of Follow-up: 04/04/2021 Time: 11:37 AM  Last Vitals:  Vitals:   04/04/21 0800 04/04/21 1136  BP: 124/78 121/76  Pulse: 89 77  Resp: 18 19  Temp: 36.9 C 37.6 C  SpO2: 99%     Level of Consciousness: alert  Pain: none   Side Effects:None  Catheter Site Exam:clean, dry, no drainage     Plan: D/C from anesthesia care at surgeon's request  Hedda Slade

## 2021-04-04 NOTE — Progress Notes (Signed)
Mom very sore nipples. She wants to pump/hand express tonight. I encouraged her to let breasts be open to air as much as possible and/or use coconut oil to prevent abrasion and let tissues heal. Reeducated on proper latch and breastfeeding-getting more areolar tissue in versus allowing baby to suck on nipples. Educated on tongue tie stretches and demonstrated how to stretch frenulum before feeds and encourage baby to lateralize tongue and suck thrusting tongue out by gently pulling mom's finger down and out as baby sucks then rubbing clean finger along sides of gums. Parents are contacting [facility] in the morning to ask for tongue tie clinic appointment. Referral is put in by lactation already. Educated on pump usage as well as cleaning parts.

## 2021-04-04 NOTE — Lactation Note (Signed)
This note was copied from a baby's chart. Lactation Consultation Note  Patient Name: Kathryn Estrada GGYIR'S Date: 04/04/2021 Reason for consult: Follow-up assessment;1st time breastfeeding;Term;Nipple pain/trauma;Other (Comment) (possible restriction) Age:25 hours  Lactation called to bedside to assist with feeding.  Mom in extreme discomfort and nervous about putting baby to breast with bilateral nipple damage.  Nipple shield applied and baby brought to breast, mom unable to tolerate baby at breast and began crying. Larger nipple shield given, size 24, mom did not feel any better. Baby taken from breast and dad swaddled and held baby to keep calm. Parents opted for donor milk for feeding while mom pumped.  Donor milk warmed and bottle left in fridge in SCN for feedings overnight. Baby tolerated 35mL feeding well and continued to cue. additional 76mL warmed and given.  Mom tearful and worries about being able to feed her baby and her nipples healing. LC provided reassurance and praise for mom and her dedication to feeding her baby.  Information given for suspected oral restriction causing nipple pain/damage, and encouraged seeking Pediatrician or Pediatric Dentist opinion.   Feeding plan: Mom to pump every 2-3 hours overnight Call RN for donor milk feeding with early cues: reviewed newborn stomach size and appropriate volumes.  Maternal Data Has patient been taught Hand Expression?: Yes Does the patient have breastfeeding experience prior to this delivery?: No  Feeding Mother's Current Feeding Choice: Breast Milk  LATCH Score Latch: Grasps breast easily, tongue down, lips flanged, rhythmical sucking.  Audible Swallowing: None  Type of Nipple: Everted at rest and after stimulation  Comfort (Breast/Nipple): Engorged, cracked, bleeding, large blisters, severe discomfort  Hold (Positioning): No assistance needed to correctly position infant at breast.  LATCH Score:  6   Lactation Tools Discussed/Used Tools: Bottle;Pump;Nipple Shields Nipple shield size: 20;24 (both tried; mom in extreme pain) Breast pump type: Double-Electric Breast Pump Pump Education: Setup, frequency, and cleaning;Milk Storage Reason for Pumping: nipple trauma Pumping frequency: q 2-3 hours  Interventions Interventions: Breast feeding basics reviewed;Assisted with latch;Hand express;Breast compression;Adjust position;Support pillows;Education (donor milk)  Discharge    Consult Status Consult Status: Follow-up Date: 04/05/21 Follow-up type: In-patient    Lavonia Drafts 04/04/2021, 4:56 PM

## 2021-04-04 NOTE — Progress Notes (Signed)
Obstetric Postpartum/PostOperative Daily Progress Note Subjective:  25 y.o. G1P1001 post-operative day # 1 status post primary cesarean delivery.  She is ambulating, is tolerating po, is voiding spontaneously.  Her pain is well controlled on PO pain medications. Her lochia is less than menses. She is currently breastfeeding with a nipple shield in place. She states she has soreness/pain with feeding. She is concerned about breastfeeding and if baby is getting enough milk.    Medications SCHEDULED MEDICATIONS  . ferrous sulfate  325 mg Oral BID WC  . ibuprofen  800 mg Oral Q8H  . ketorolac  30 mg Intravenous Q6H   Or  . ketorolac  30 mg Intramuscular Q6H  . prenatal multivitamin  1 tablet Oral Q1200  . senna-docusate  2 tablet Oral Q24H  . simethicone  80 mg Oral TID PC    MEDICATION INFUSIONS  . bupivacaine ON-Q pain pump    . lactated ringers    . naLOXone [facility]) adult infusion for PRURITIS      PRN MEDICATIONS  bisacodyl, coconut oil, witch hazel-glycerin **AND** dibucaine, diphenhydrAMINE **OR** diphenhydrAMINE, diphenhydrAMINE, HYDROmorphone (DILAUDID) injection, menthol-cetylpyridinium, menthol-cetylpyridinium, nalbuphine **OR** nalbuphine, nalbuphine **OR** nalbuphine, naloxone **AND** sodium chloride flush, naLOXone (NARCAN) adult infusion for PRURITIS, ondansetron (ZOFRAN) IV, oxyCODONE **OR** oxyCODONE, oxyCODONE, simethicone, sodium phosphate, zolpidem    Objective:   Vitals:   04/03/21 1606 04/03/21 1921 04/03/21 2239 04/04/21 0800  BP: 108/62 119/79 114/77 124/78  Pulse: 73 93 83 89  Resp:  18 18 18   Temp: 98 F (36.7 C) 98 F (36.7 C) 98.3 F (36.8 C) 98.5 F (36.9 C)  TempSrc: Oral Oral Oral Oral  SpO2: 99% 97% 97% 99%  Weight:      Height:        Current Vital Signs 24h Vital Sign Ranges  T 98.5 F (36.9 C) Temp  Avg: 98.1 F (36.7 C)  Min: 97.7 F (36.5 C)  Max: 98.5 F (36.9 C)  BP 124/78 BP  Min: 105/78  Max: 130/78  HR 89 Pulse  Avg: 73.3  Min: 61   Max: 93  RR 18 Resp  Avg: 18.4  Min: 12  Max: 30  SaO2 99 % Room Air SpO2  Avg: 98.3 %  Min: 93 %  Max: 100 %       24 Hour I/O Current Shift I/O  Time Ins Outs 04/12 0701 - 04/13 0700 In: 718.6 [I.V.:618.6] Out: 1180 [Urine:450] No intake/output data recorded.  General: NAD Pulmonary: no increased work of breathing Abdomen: non-distended, non-tender, fundus firm at level of umbilicus Inc: Clean/dry/intact, on-Q catheters in place, intact - dressing c/d/i Extremities: no edema, no erythema, no tenderness  Labs:  Recent Labs  Lab 04/02/21 1039 04/04/21 0537  WBC 10.5 11.0*  HGB 11.4* 9.0*  HCT 34.2* 28.0*  PLT 168 140*     Assessment:   25 y.o. G1P1001 postoperative day # 1 status post primary cesarean section, lactating  Plan:  1) Acute blood loss anemia - hemodynamically stable and asymptomatic - po ferrous sulfate  2) O POS Performed at [facility], [address] [place], [place] [address]  / Charlynn Grimes 3.17 (09/23 1628)/ Varicella Immune  3) TDAP status - needs postpartum  4) Breastfeeding - plan made with patient to work with [facility] today, reviewed physiology of breastfeeding and need for frequent, on demand feeds while establishing milk supply / Contraception = undecided  5) Disposition - Continue current care, plan to assess for discharge status tomorow  [employer],  CNM 04/04/2021 8:39 AM

## 2021-04-04 NOTE — Anesthesia Postprocedure Evaluation (Signed)
Anesthesia Post Note  Patient: Kathryn Estrada  Procedure(s) Performed: CESAREAN SECTION (N/A )  Patient location during evaluation: Mother Baby Anesthesia Type: Spinal Level of consciousness: oriented and awake and alert Pain management: pain level controlled Vital Signs Assessment: post-procedure vital signs reviewed and stable Respiratory status: spontaneous breathing and respiratory function stable Cardiovascular status: blood pressure returned to baseline and stable Postop Assessment: no headache, no backache, no apparent nausea or vomiting and able to ambulate Anesthetic complications: no   No complications documented.   Last Vitals:  Vitals:   04/03/21 2239 04/04/21 0800  BP: 114/77 124/78  Pulse: 83 89  Resp: 18 18  Temp: 36.8 C 36.9 C  SpO2: 97% 99%    Last Pain:  Vitals:   04/04/21 0910  TempSrc:   PainSc: 0-No pain                 Nakia Remmers Lorenza Chick

## 2021-04-04 NOTE — Lactation Note (Signed)
This note was copied from a baby's chart. Lactation Consultation Note  Patient Name: Kathryn Estrada YOMAY'O Date: 04/04/2021 Reason for consult: Follow-up assessment;1st time breastfeeding;Term;Other (Comment) (c-section; possible oral restriction) Age:25 years  Lactation follow-up. Baby active at R breast with NS. Mom reports nipple damage, cracked/bleeding, and in pain with baby at the breast. LC observed position/latch: baby has flanged top/bottom lips, aligned at breast- tummy to mommy, hips turned in, and nose to nipple. Mom has been given coconut oil, but has not used. LC hand expressed on L breast and rubbed in for healing, educated mom on benefits of EBM, and use of coconut oil in comfort and healing, encouraging continual use.  Mom asked for help with feeding on L breast; Kathryn Estrada assisted, mom immediately began crying when baby accepted breast/shield. Baby also not overly eager, and was pulled off and placed on mom's chest where she settled. LC observed baby's mouth: upper lip seems tight, and it appears to have a possible tongue restriction- also in line with the damage evident on mom's nipples and the pain she feels when baby is at the breast. (Mom has hx of oral restriction herself- LC observed mom's tongue, looks like revision grew back over the years).  [place] talked over a feeding plan and options with mom. Mom opts for possible pumping and giving output to baby via alternate method "for now".  Grandmother active in the room, dad supportive, LC to follow-up.  Maternal Data Has patient been taught Hand Expression?: Yes Does the patient have breastfeeding experience prior to this delivery?: No  Feeding Mother's Current Feeding Choice: Breast Milk  LATCH Score Latch: Grasps breast easily, tongue down, lips flanged, rhythmical sucking. (with nipple shield)  Audible Swallowing: A few with stimulation  Type of Nipple: Everted at rest and after stimulation  Comfort (Breast/Nipple):  Filling, red/small blisters or bruises, mild/mod discomfort  Hold (Positioning): No assistance needed to correctly position infant at breast.  LATCH Score: 8   Lactation Tools Discussed/Used Tools: Nipple Jefferson Fuel;Pump Nipple shield size: 20 (cracked/bleeding) Breast pump type: Double-Electric Breast Pump Pump Education: Setup, frequency, and cleaning;Milk Storage Reason for Pumping: crack/bleeding; painful latch (suspected oral restriction) Pumping frequency: q 3hrs  Interventions Interventions: Breast feeding basics reviewed;Hand express;Breast massage;Breast compression;Education (nipple shield)  Discharge    Consult Status Consult Status: Follow-up Date: 04/04/21 Follow-up type: In-patient    Lavonia Drafts 04/04/2021, 9:40 AM

## 2021-04-05 MED ORDER — OXYCODONE HCL 5 MG PO TABS: 5.0000 mg | ORAL_TABLET | ORAL | 0 refills | Status: DC | PRN

## 2021-04-05 MED ORDER — IBUPROFEN 800 MG PO TABS: 800.0000 mg | ORAL_TABLET | Freq: Three times a day (TID) | ORAL | 0 refills | Status: DC

## 2021-04-05 NOTE — Progress Notes (Signed)
Pt discharged with infant. Discharge instructions, prescriptions, and follow up appointments given to and reviewed with patient. Pt verbalized understanding. Escorted out by auxillary.  

## 2021-04-05 NOTE — Lactation Note (Signed)
This note was copied from a baby's chart. Lactation Consultation Note  Patient Name: Kathryn Estrada IBBCW'U Date: 04/05/2021 Reason for consult: Follow-up assessment;1st time breastfeeding;Term;Nipple pain/trauma;Other (Comment) (c/s, possible tongue restriction) Age:25 hours  Lactation follow-up prior to discharge. Baby given donor milk via bottle overnight with any EBM mom received while pumping. Mom has bilateral nipple damage, even with use of nipple shield; infant has suspected oral restriction. [facility] provided outpatient pediatric dentist information to family, who plans to schedule appointment for assessment. Mom given loaner DEBP through [place] until able to receive hospital grade pump through [facility] currently out of pumps). LC reviewed breast and nipple care, continued use of coconut oil and EBM on nipple fore wound healing, and coconut oil when pumping. LC reviewed anticipated breast changes and management of breast fullness/engorgement and when to seek MD care.  Encourage outpatient lactation appointment after appointment with pediatric dentist to assist with getting baby back to the breast.  Reviewed newborn stomach sizes and changes, appropriate volumes, warming and thawing of donor milk, storage and warming/thawing of EBM at home.  [facility] referral faxed in, mom to follow-up on obtainment of pump through them.   Mom encouraged to call with questions/concerns and for ongoing breastfeeding support before leaving and post discharge. Outpatient referral information given as well as breastfeeding support in the community.   Maternal Data Has patient been taught Hand Expression?: Yes Does the patient have breastfeeding experience prior to this delivery?: No  Feeding Mother's Current Feeding Choice: Breast Milk Nipple Type: Slow - flow  LATCH Score                    Lactation Tools Discussed/Used Tools: Pump;Coconut oil;Nipple Jefferson Fuel;Bottle Nipple shield  size: 20 Breast pump type: Double-Electric Breast Pump Pump Education: Setup, frequency, and cleaning;Milk Storage Reason for Pumping: nipple trauma; oral restriction Pumping frequency: q 3 hrs Pumped volume: 10 mL  Interventions    Discharge Discharge Education: Engorgement and breast care;Warning signs for feeding baby;Outpatient recommendation Pump: DEBP;WIC Loaner WIC Program: Yes  Consult Status Consult Status: Complete Date: 04/05/21 Follow-up type: Call as needed    Lavonia Drafts 04/05/2021, 9:01 AM

## 2021-04-05 NOTE — Discharge Summary (Signed)
Postpartum Discharge Summary  Date of Service updated4/14/2022     Patient Name: Kathryn Estrada DOB: Dec 27, 1995 MRN: [id number]  Date of admission: 04/03/2021 Delivery date:04/03/2021  Delivering provider: Adrian Prows R  Date of discharge: 04/05/2021  Admitting diagnosis: Complete breech presentation [O32.1XX0] Intrauterine pregnancy: [redacted]w[redacted]d    Secondary diagnosis:  Active Problems:      [redacted] weeks gestation of pregnancy   Cesarean delivery delivered   Postpartum care and examination of lactating mother  Additional problems: none    Discharge diagnosis: Term Pregnancy Delivered                                              Post partum procedures:none Augmentation: N/A Complications: None  Hospital course: Sceduled C/S   25y.o. yo G1P1001 at [id number] admitted to the hospital 04/03/2021 for scheduled cesarean section with the following indication:elective primary.Delivery details are as follows:  Membrane Rupture Time/Date: 8:36 AM ,04/03/2021   Delivery Method:C-Section, elective Details of operation can be found in separate operative note.  Patient had an uncomplicated postpartum course.  She is ambulating, tolerating a regular diet, passing flatus, and urinating well. Patient is discharged home in stable condition on  04/05/21        Newborn Data: Birth date:04/03/2021  Birth time:8:37 AM  Gender:Female  Living status:Living  Apgars:8 ,9  Weight:3500 g     Magnesium Sulfate received: No BMZ received: No Rhophylac:N/A MMR:No T-DaP:Given postpartum Flu: No Transfusion:No  Physical exam  Vitals:   04/04/21 1136 04/04/21 1600 04/04/21 2314 04/05/21 0837  BP: 121/76 116/82 129/90 131/84  Pulse: 77 85 89 98  Resp: 19 16 16 18   Temp: 97.7 F (36.5 C) 98.3 F (36.8 C) 98.4 F (36.9 C) 98 F (36.7 C)  TempSrc: Oral Oral Oral   SpO2: 100% 99% 99% 99%  Weight:      Height:       General: alert, cooperative and no distress Lochia: appropriate Uterine  Fundus: firm. ON Q system in place Incision: Healing well with no significant drainage, Dressing is clean, dry, and intact DVT Evaluation: No evidence of DVT seen on physical exam. Negative Homan's sign. Labs: Lab Results  Component Value Date   WBC 11.0 (H) 04/04/2021   HGB 9.0 (L) 04/04/2021   HCT 28.0 (L) 04/04/2021   MCV 84.8 04/04/2021   PLT 140 (L) 04/04/2021   CMP Latest Ref Rng & Units 06/06/2020  Glucose 70 - 99 mg/dL 97  BUN 6 - 20 mg/dL 13  Creatinine 0.44 - 1.00 mg/dL 0.85  Sodium 135 - 145 mmol/L 139  Potassium 3.5 - 5.1 mmol/L 4.7  Chloride 98 - 111 mmol/L 105  CO2 22 - 32 mmol/L 27  Calcium 8.9 - 10.3 mg/dL 9.6  Total Protein 6.0 - 8.5 g/dL -  Total Bilirubin 0.0 - 1.2 mg/dL -  Alkaline Phos 39 - 117 IU/L -  AST 0 - 40 IU/L -  ALT 0 - 32 IU/L -   Edinburgh Score: Edinburgh Postnatal Depression Scale Screening Tool 04/03/2021  I have been able to laugh and see the funny side of things. 0  I have looked forward with enjoyment to things. 0  I have blamed myself unnecessarily when things went wrong. 1  I have been anxious or worried for no good reason. 1  I have felt scared or  panicky for no good reason. 0  Things have been getting on top of me. 0  I have been so unhappy that I have had difficulty sleeping. 0  I have felt sad or miserable. 0  I have been so unhappy that I have been crying. 0  The thought of harming myself has occurred to me. 0  Edinburgh Postnatal Depression Scale Total 2      After visit meds:  Allergies as of 04/05/2021   No Known Allergies     Medication List    TAKE these medications   calcium carbonate 500 MG chewable tablet Commonly known as: TUMS - dosed in mg elemental calcium Chew 1 tablet by mouth daily.   docusate sodium 100 MG capsule Commonly known as: COLACE Take 300 mg by mouth daily as needed for mild constipation.   ibuprofen 800 MG tablet Commonly known as: ADVIL Take 1 tablet (800 mg total) by mouth every 8  (eight) hours.   ondansetron 4 MG disintegrating tablet Commonly known as: Zofran ODT Take 1 tablet (4 mg total) by mouth every 6 (six) hours as needed for nausea.   oxyCODONE 5 MG immediate release tablet Commonly known as: Oxy IR/ROXICODONE Take 1 tablet (5 mg total) by mouth every 4 (four) hours as needed for moderate pain.   Prenatal Vitamin and Mineral 28-0.8 MG Tabs Take 1 tablet by mouth daily.            Discharge Care Instructions  (From admission, onward)         Start     Ordered   04/05/21 0000  Leave dressing on - Keep it clean, dry, and intact until clinic visit        04/05/21 1151           Discharge home in stable condition Infant Feeding: Breast Infant Disposition:home with mother Discharge instruction: per After Visit Summary and Postpartum booklet. Activity: Advance as tolerated. Pelvic rest for 6 weeks.  Diet: routine diet Anticipated Birth Control: Unsure Postpartum Appointment:1 week Additional Postpartum F/U: in 6 weeks Future Appointments: Future Appointments  Date Time Provider [place]  04/10/2021  9:50 AM Gilman Schmidt, Stefanie Libel, MD [facility] None   Follow up Visit:In one week and in 6 weeks      04/05/2021 Imagene Riches, CNM

## 2021-04-05 NOTE — Discharge Instructions (Signed)
Discharge Instructions:   Follow-up Appointment: 1 week, please call the office to schedule  If there are any new medications, they have been ordered and will be available for pickup at the listed pharmacy on your way home from the hospital.   Call the office if you have any of the following: headache, visual changes, fever >101.0 F, chills, shortness of breath, breast concerns, excessive vaginal bleeding, incision drainage or problems, leg pain or redness, depression or any other concerns. If you have vaginal discharge with an odor, let your doctor know.   It is normal to bleed for up to 6 weeks. You should not soak through more than 1 pad in 1 hour. If you have a blood clot larger than your fist with continued bleeding, call your doctor.   After a c-section, you should expect a small amount of blood or clear fluid coming from the incision and abdominal cramping/soreness. Inspect your incision site daily. Stand in front of a mirror to look for any redness, incision opening, or discolored/odorness drainage. Take a shower daily and continue good hygiene. Use own towel and washcloth (do not share). Make sure your sheets on your bed are clean. No pets sleeping around your incision site. Dressing will be removed at your postpartum visit. If the dressing does become wet or soiled underneath, it is okay to remove it before your visit.    On-Q pump: You will remove on day 4 after insertion or if the ball becomes flat before day 4. You will remove on: 04/07/2021.  Activity: Do not lift > 15 lbs for 6 weeks (do not lift anything heavier than your baby). No intercourse, tampons, swimming pools, hot tubs, baths (only showers) for 6 weeks.  No driving for 1-2 weeks. Do not drive while taking narcotic or opioid pain medication.  Continue taking your prenatal vitamin, especially if breastfeeding. Increase calories and fluids (water) while breastfeeding.   Your milk will come in, in the next couple of days  (right now it is colostrum). You may have a slight fever when your milk comes in, but it should go away on its own.  If it does not, and rises above 101 F please call the doctor. You will also feel achy and your breasts will be firm. They will also start to leak. If you are breastfeeding, continue as you have been and you can pump/express milk for comfort.   If you have too much milk, your breasts can become engorged, which could lead to mastitis. This is an infection of the milk ducts. It can be very painful and you will need to notify your doctor to obtain a prescription for antibiotics. You can also treat it with a shower or hot/cold compress.   For concerns about your baby, please call your pediatrician.  For breastfeeding concerns, the lactation consultant can be reached at [phone].   Postpartum blues (feelings of happy one minute and sad another minute) are normal for the first few weeks but if it gets worse let your doctor know.   Congratulations! We enjoyed caring for you and your new bundle of joy!     Cesarean Delivery, Care After This sheet gives you information about how to care for yourself after your procedure. Your health care provider may also give you more specific instructions. If you have problems or questions, contact your health care provider. What can I expect after the procedure? After the procedure, it is common to have:  A small amount of blood or  clear fluid coming from the incision.  Some redness, swelling, and pain in your incision area.  Some abdominal pain and soreness.  Vaginal bleeding (lochia). Even though you did not have a vaginal delivery, you will still have vaginal bleeding and discharge.  Pelvic cramps.  Fatigue. You may have pain, swelling, and discomfort in the tissue between your vagina and your anus (perineum) if:  Your C-section was unplanned, and you were allowed to labor and push.  An incision was made in the area (episiotomy) or the  tissue tore during attempted vaginal delivery. Follow these instructions at home: Incision care  Follow instructions from your health care provider about how to take care of your incision. Make sure you: ? Wash your hands with soap and water before you change your bandage (dressing). If soap and water are not available, use hand sanitizer. ? If you have a dressing, change it or remove it as told by your health care provider. ? Leave stitches (sutures), skin staples, skin glue, or adhesive strips in place. These skin closures may need to stay in place for 2 weeks or longer. If adhesive strip edges start to loosen and curl up, you may trim the loose edges. Do not remove adhesive strips completely unless your health care provider tells you to do that.  Check your incision area every day for signs of infection. Check for: ? More redness, swelling, or pain. ? More fluid or blood. ? Warmth. ? Pus or a bad smell.  Do not take baths, swim, or use a hot tub until your health care provider says it's okay. Ask your health care provider if you can take showers.  When you cough or sneeze, hug a pillow. This helps with pain and decreases the chance of your incision opening up (dehiscing). Do this until your incision heals.   Medicines  Take over-the-counter and prescription medicines only as told by your health care provider.  If you were prescribed an antibiotic medicine, take it as told by your health care provider. Do not stop taking the antibiotic even if you start to feel better.  Do not drive or use heavy machinery while taking prescription pain medicine. Lifestyle  Do not drink alcohol. This is especially important if you are breastfeeding or taking pain medicine.  Do not use any products that contain nicotine or tobacco, such as cigarettes, e-cigarettes, and chewing tobacco. If you need help quitting, ask your health care provider. Eating and drinking  Drink at least 8 eight-ounce glasses  of water every day unless told not to by your health care provider. If you breastfeed, you may need to drink even more water.  Eat high-fiber foods every day. These foods may help prevent or relieve constipation. High-fiber foods include: ? Whole grain cereals and breads. ? Brown rice. ? Beans. ? Fresh fruits and vegetables. Activity  If possible, have someone help you care for your baby and help with household activities for at least a few days after you leave the hospital.  Return to your normal activities as told by your health care provider. Ask your health care provider what activities are safe for you.  Rest as much as possible. Try to rest or take a nap while your baby is sleeping.  Do not lift anything that is heavier than 10 lbs (4.5 kg), or the limit that you were told, until your health care provider says that it is safe.  Talk with your health care provider about when you can engage  in sexual activity. This may depend on your: ? Risk of infection. ? How fast you heal. ? Comfort and desire to engage in sexual activity.   General instructions  Do not use tampons or douches until your health care provider approves.  Wear loose, comfortable clothing and a supportive and well-fitting bra.  Keep your perineum clean and dry. Wipe from front to back when you use the toilet.  If you pass a blood clot, save it and call your health care provider to discuss. Do not flush blood clots down the toilet before you get instructions from your health care provider.  Keep all follow-up visits for you and your baby as told by your health care provider. This is important. Contact a health care provider if:  You have: ? A fever. ? Bad-smelling vaginal discharge. ? Pus or a bad smell coming from your incision. ? Difficulty or pain when urinating. ? A sudden increase or decrease in the frequency of your bowel movements. ? More redness, swelling, or pain around your incision. ? More fluid  or blood coming from your incision. ? A rash. ? Nausea. ? Little or no interest in activities you used to enjoy. ? Questions about caring for yourself or your baby.  Your incision feels warm to the touch.  Your breasts turn red or become painful or hard.  You feel unusually sad or worried.  You vomit.  You pass a blood clot from your vagina.  You urinate more than usual.  You are dizzy or light-headed. Get help right away if:  You have: ? Pain that does not go away or get better with medicine. ? Chest pain. ? Difficulty breathing. ? Blurred vision or spots in your vision. ? Thoughts about hurting yourself or your baby. ? New pain in your abdomen or in one of your legs. ? A severe headache.  You faint.  You bleed from your vagina so much that you fill more than one sanitary pad in one hour. Bleeding should not be heavier than your heaviest period. Summary  After the procedure, it is common to have pain at your incision site, abdominal cramping, and slight bleeding from your vagina.  Check your incision area every day for signs of infection.  Tell your health care provider about any unusual symptoms.  Keep all follow-up visits for you and your baby as told by your health care provider. This information is not intended to replace advice given to you by your health care provider. Make sure you discuss any questions you have with your health care provider. Document Revised: 06/17/2018 Document Reviewed: 06/17/2018 Elsevier Patient Education  2021 [employer] [place].   Postpartum Baby Blues The postpartum period begins right after the birth of a baby. During this time, there is often joy and excitement. It is also a time of many changes in the life of the parents. A mother may feel happy one minute and sad or stressed the next. These feelings of sadness, called the baby blues, usually happen in the period right after the baby is born and go away within a week or two. What are  the causes? The exact cause of this condition is not known. Changes in hormone levels after childbirth are believed to trigger some of the symptoms. Other factors that can play a role in these mood changes include:  Lack of sleep.  Stressful life events, such as financial problems, caring for a loved one, or death of a loved one.  Genetics. What  are the signs or symptoms? Symptoms of this condition include:  Changes in mood, such as going from extreme happiness to sadness.  A decrease in concentration.  Difficulty sleeping.  Crying spells and tearfulness.  Loss of appetite.  Irritability.  Anxiety. If these symptoms last for more than 2 weeks or become more severe, you may have postpartum depression. How is this diagnosed? This condition is diagnosed based on an evaluation of your symptoms. Your health care provider may use a screening tool that includes a list of questions to help identify a person with the baby blues or postpartum depression. How is this treated? The baby blues usually go away on their own in 1-2 weeks. Social support is often what is needed. You will be encouraged to get adequate sleep and rest. Follow these instructions at home: Lifestyle  Get as much rest as you can. Take a nap when the baby sleeps.  Exercise regularly as told by your health care provider. Some women find yoga and walking to be helpful.  Eat a balanced and nourishing diet. This includes plenty of fruits and vegetables, whole grains, and lean proteins.  Do little things that you enjoy. Take a bubble bath, read your favorite magazine, or listen to your favorite music.  Avoid alcohol.  Ask for help with household chores, cooking, grocery shopping, or running errands. Do not try to do everything yourself. Consider hiring a postpartum doula to help. This is a professional who specializes in providing support to new mothers.  Try not to make any major life changes during pregnancy or right  after giving birth. This can add stress.      General instructions  Talk to people close to you about how you are feeling. Get support from your partner, family members, friends, or other new moms. You may want to join a support group.  Find ways to manage stress. This may include: ? Writing your thoughts and feelings in a journal. ? Spending time outside. ? Spending time with people who make you laugh.  Try to stay positive in how you think. Think about the things you are grateful for.  Take over-the-counter and prescription medicines only as told by your health care provider.  Let your health care provider know if you have any concerns.  Keep all postpartum visits. This is important. Contact a health care provider if:  Your baby blues do not go away after 2 weeks. Get help right away if:  You have thoughts of taking your own life (suicidal thoughts), or of harming your baby or someone else.  You see or hear things that are not there (hallucinations). If you ever feel like you may hurt yourself or others, or have thoughts about taking your own life, get help right away. Go to your nearest emergency department or:  Call your local emergency services (911 in the [place]  Call a suicide crisis helpline, such as the [facility], at 3611286051. This is open 24 hours a day in the [place]  Text the Crisis Text Line at 6392149545 (in the [place].). Summary  After giving birth, you may feel happy one minute and sad or stressed the next. Feelings of sadness that happen right after the baby is born and go away after a week or two are called the baby blues.  You can manage the baby blues by getting enough rest, eating a healthy diet, exercising, spending time with supportive people, and finding ways to manage stress.  If feelings of  sadness and stress last longer than 2 weeks or get in the way of caring for your baby, talk with your health care provider. This may mean  you have postpartum depression. This information is not intended to replace advice given to you by your health care provider. Make sure you discuss any questions you have with your health care provider. Document Revised: 06/02/2020 Document Reviewed: 06/02/2020 Elsevier Patient Education  2021 [employer].

## 2021-04-09 ENCOUNTER — Telehealth: Payer: Self-pay

## 2021-04-09 NOTE — Telephone Encounter (Signed)
Pt called after hour nurse 04/05/21 9:33pm c/o c/s on Tues; just got home from the hospital and all of a sudden there is a hot sensation across her incision and all the way to her feet; states it comes and goes.  [phone]  Was told to call provider within 24hrs.  Pt states everything is going well; thinks it was her nerves waking up.  Reminded of appt tomorrow.

## 2021-04-10 ENCOUNTER — Other Ambulatory Visit: Payer: Self-pay

## 2021-04-10 ENCOUNTER — Ambulatory Visit (INDEPENDENT_AMBULATORY_CARE_PROVIDER_SITE_OTHER): Payer: Medicaid Other | Admitting: Obstetrics and Gynecology

## 2021-04-10 ENCOUNTER — Encounter: Payer: Self-pay | Admitting: Obstetrics and Gynecology

## 2021-04-10 NOTE — Progress Notes (Signed)
Postpartum Visit  Chief Complaint:  Chief Complaint  Patient presents with  . Postpartum Care    History of Present Illness: Patient is a 25 y.o. G1P1001 presents for postpartum visit.  Date of delivery: 04/03/2021 Type of delivery: cesarean Laceration: no  Pregnancy or labor problems: none  Breast Feeding:  yes Lochia: normal Post partum depression/anxiety noted:  no Any problems since the delivery:  no  Newborn Details:  SINGLETON :  1. Baby Gender:female.  Infant Status: Infant doing well at home with mother.   Review of Systems: ROS   Past Medical History:  Past Medical History:  Diagnosis Date  . Complication of anesthesia    needs a good amount of anesthesia to get numb  . Raynaud's syndrome without gangrene     Past Surgical History:  Past Surgical History:  Procedure Laterality Date  . CESAREAN SECTION N/A 04/03/2021   Procedure: CESAREAN SECTION;  Surgeon: Homero Fellers, MD;  Location: [facility] ORS;  Service: Obstetrics;  Laterality: N/A;    Family History:  Family History  Problem Relation Age of Onset  . Neurofibromatosis Mother   . Anemia Mother   . Diabetes Other     Social History:  Social History   Socioeconomic History  . Marital status: Married    Spouse name: Dorothea Ogle  . Number of children: Not on file  . Years of education: Not on file  . Highest education level: Not on file  Occupational History  . Not on file  Tobacco Use  . Smoking status: Former Smoker    Types: Cigarettes, E-cigarettes    Quit date: 08/28/2020    Years since quitting: 0.6  . Smokeless tobacco: Never Used  Vaping Use  . Vaping Use: Every day  . Last attempt to quit: 08/28/2020  . Devices: vape   Substance and Sexual Activity  . Alcohol use: Not Currently    Comment: ocassinally/ once a month   . Drug use: Never  . Sexual activity: Yes    Birth control/protection: None    Comment: undecided  Other Topics Concern  . Not on file  Social History  Narrative  . Not on file   Social Determinants of Health   Financial Resource Strain: Not on file  Food Insecurity: Not on file  Transportation Needs: Not on file  Physical Activity: Not on file  Stress: Not on file  Social Connections: Not on file  Intimate Partner Violence: Not on file    Allergies:  No Known Allergies  Medications: Prior to Admission medications   Medication Sig Start Date End Date Taking? Authorizing Provider  calcium carbonate (TUMS - DOSED IN MG ELEMENTAL CALCIUM) 500 MG chewable tablet Chew 1 tablet by mouth daily.   Yes [provider]  docusate sodium (COLACE) 100 MG capsule Take 300 mg by mouth daily as needed for mild constipation.   Yes [provider]  ibuprofen (ADVIL) 800 MG tablet Take 1 tablet (800 mg total) by mouth every 8 (eight) hours. 04/05/21  Yes Imagene Riches, CNM  ondansetron (ZOFRAN ODT) 4 MG disintegrating tablet Take 1 tablet (4 mg total) by mouth every 6 (six) hours as needed for nausea. 12/06/20  Yes Esequiel Kleinfelter R, MD  oxyCODONE (OXY IR/ROXICODONE) 5 MG immediate release tablet Take 1 tablet (5 mg total) by mouth every 4 (four) hours as needed for moderate pain. 04/05/21  Yes Imagene Riches, CNM  Prenatal Vit-Fe Fumarate-FA (PRENATAL VITAMIN AND MINERAL) 28-0.8 MG TABS Take 1  tablet by mouth daily.   Yes [provider]    Physical Exam Vitals:  Vitals:   04/10/21 1006  BP: 120/70    OBGyn Exam  Assessment: 25 y.o. G1P1001 presenting for 6 week postpartum visit  Plan: Problem List Items Addressed This Visit   None      1) Contraception-  condoms  2)  Pap: ASCCP guidelines and rational discussed.  Patient opts for 3 year screening interval.  3) Patient underwent screening for postpartum depression with no concerns noted.     - Follow up in 5 weeks for routine annual exam  Adrian Prows MD, [place], [facility] 04/10/2021 11:04 AM

## 2021-04-10 NOTE — Patient Instructions (Addendum)
INCREASING YOUR MILK SUPPLY  Mothers make more milk by removing milk from their breasts often. Removing milk frequently:  __ If you are breastfeeding your baby directly, breastfeed your baby at least every three hours during the day. __ Use your breast pump for 5 to 10 minutes after each feeding or pump approximately 30 to 60 minutes after you finish feeding (Choose the schedule that is more convenient for you and so you remove more milk with the pump.) __ If you are using a breast pump instead of breastfeeding directly, pump your breasts at least 8 times each 24 hours (every three hours).  Cesarean Delivery Cesarean birth, or cesarean delivery, is the surgical delivery of a baby through an incision in the abdomen and the uterus. This may be referred to as a C-section. This procedure may be scheduled ahead of time, or it may be done in an emergency situation. Tell a health care provider about:  Any allergies you have.  All medicines you are taking, including vitamins, herbs, eye drops, creams, and over-the-counter medicines.  Any problems you or family members have had with anesthetic medicines.  Any blood disorders you have.  Any surgeries you have had.  Any medical conditions you have.  Whether you or any members of your family have a history of deep vein thrombosis (DVT) or pulmonary embolism (PE). What are the risks? Generally, this is a safe procedure. However, problems may occur, including:  Infection.  Bleeding.  Allergic reactions to medicines.  Damage to other structures or organs.  Blood clots.  Injury to your baby. What happens before the procedure? General instructions  Follow instructions from your health care provider about eating or drinking restrictions.  If you know that you are going to have a cesarean delivery, do not shave your pubic area. Shaving before the procedure may increase your risk of infection.  Plan to have someone take you home from  the hospital.  Ask your health care provider what steps will be taken to prevent infection. These may include: ? Removing hair at the surgery site. ? Washing skin with a germ-killing soap. ? Taking antibiotic medicine.  Depending on the reason for your cesarean delivery, you may have a physical exam or additional testing, such as an ultrasound.  You may have your blood or urine tested. Questions for your health care provider  Ask your health care provider about: ? Changing or stopping your regular medicines. This is especially important if you are taking diabetes medicines or blood thinners. ? Your pain management plan. This is especially important if you plan to breastfeed your baby. ? How long you will be in the hospital after the procedure. ? Any concerns you may have about receiving blood products, if you need them during the procedure. ? Cord blood banking, if you plan to collect your baby's umbilical cord blood.  You may also want to ask your health care provider: ? Whether you will be able to hold or breastfeed your baby while you are still in the operating room. ? Whether your baby can stay with you immediately after the procedure and during your recovery. ? Whether a family member or a person of your choice can go with you into the operating room and stay with you during the procedure, immediately after the procedure, and during your recovery. What happens during the procedure?  An IV will be inserted into one of your veins.  Fluid and medicines, such as antibiotics, will be given before the surgery.  Fetal monitors will be placed on your abdomen to check your baby's heart rate.  You may be given a special warming gown to wear to keep your temperature stable.  A catheter may be inserted into your bladder through your urethra. This drains your urine during the procedure.  You may be given one or more of the following: ? A medicine to numb the area (local anesthetic). ? A  medicine to make you fall asleep (general anesthetic). ? A medicine (regional anesthetic) that is injected into your back or through a small thin tube placed in your back (spinal anesthetic or epidural anesthetic). This numbs everything below the injection site and allows you to stay awake during your procedure. If this makes you feel nauseous, tell your health care provider. Medicines will be available to help reduce any nausea you may feel.  An incision will be made in your abdomen, and then in your uterus.  If you are awake during your procedure, you may feel tugging and pulling in your abdomen, but you should not feel pain. If you feel pain, tell your health care provider immediately.  Your baby will be removed from your uterus. You may feel more pressure or pushing while this happens.  Immediately after birth, your baby will be dried and kept warm. You may be able to hold and breastfeed your baby.  The umbilical cord may be clamped and cut during this time. This usually occurs after waiting a period of 1-2 minutes after delivery.  Your placenta will be removed from your uterus.  Your incisions will be closed with stitches (sutures). Staples, skin glue, or adhesive strips may also be applied to the incision in your abdomen.  Bandages (dressings) may be placed over the incision in your abdomen. The procedure may vary among health care providers and hospitals.   What happens after the procedure?  Your blood pressure, heart rate, breathing rate, and blood oxygen level will be monitored until you are discharged from the hospital.  You may continue to receive fluids and medicines through an IV.  You will have some pain. Medicines will be available to help control your pain.  To help prevent blood clots: ? You may be given medicines. ? You may have to wear compression stockings or devices. ? You will be encouraged to walk around when you are able.  Hospital staff will encourage and  support bonding with your baby. Your hospital may have you and your baby to stay in the same room (rooming in) during your hospital stay to encourage successful bonding and breastfeeding.  You may be encouraged to cough and breathe deeply often. This helps to prevent lung problems.  If you have a catheter draining your urine, it will be removed as soon as possible after your procedure. Summary  Cesarean birth, or cesarean delivery, is the surgical delivery of a baby through an incision in the abdomen and the uterus.  Follow instructions from your health care provider about eating or drinking restrictions before the procedure.  You will have some pain after the procedure. Medicines will be available to help control your pain.  Hospital staff will encourage and support bonding with your baby after the procedure. Your hospital may have you and your baby to stay in the same room (rooming in) during your hospital stay to encourage successful bonding and breastfeeding. This information is not intended to replace advice given to you by your health care provider. Make sure you discuss any questions you have with  your health care provider. Document Revised: 08/07/2020 Document Reviewed: 06/15/2018 Elsevier Patient Education  2021 [place].   A schedule may be helpful as you plan at least 8 feedings and pumping. 7:00 a.m. 10:00 a.m. 1:00 p.m. 4:00 p.m. 7:00 p.m. 10:00 p.m. 1:00 a.m.* 4:00 a.m.* 25% of your milk can be obtained during the night.  *It is probably better to not set an alarm clock for nighttime feedings/pumping. If you or your baby wakes during the night, it is a good idea to feed or pump. You can feed or pump more often during the day (any time your baby is interested in feeding) in order to have 8 feedings or pumping each 24 hours.  Removing as much milk as possible:  __ Massage your breasts before feeding or pumping to move your milk to your nipple. Roll your nipples  between your finger and thumb before feeding or pumping. __ Massage your breasts while you are feeding or pumping to help your milk flow. __ Always encourage your baby to take as much milk as possible at each feeding. Don't stop your feedings just because a certain amount of time has passed.  Taking care of yourself:  __ Increase your rest for 3 to 5 days. Get other people to help with household chores. Sleep or rest when your baby sleeps. __ Eating nutritious foods helps you feel better. There are no foods that magically increase your milk supply. __ Drink plenty of fluids when you are thirsty. Don't "force" yourself to drink lots of water and fluids. (Don't drink more than you want.)  Reglan (Metaclopromide):  Reglan is a prescription medication which has been given to women to increase their breast milk supply. It increases the prolactin hormone which increases milk production. Reglan is often given to treat gastric reflux, a stomach condition. It is given to premature or fullterm babies who have this condition and is considered safe for these babies. Sometimes Reglan is given for months.  Reglan is a medication which has few side effects. Some people find that it makes them sleepy. This can be helpful for new mothers who need to take naps since they are up at night with their babies. However, if you are very drowsy and sleepy when you drive a car, you need to have someone else drive, decrease your dosage of Reglan, or stop taking Reglan. If your milk supply does not increase with this dosage (after 1 to 2 weeks), ask your doctor if you can take 15mg  of Reglan three times each day.  After two weeks of taking Reglan, if your milk supply has increased enough to satisfy your baby, you can gradually reduce the amount that you take. If taking less Reglan causes you to make less milk, return to the dosage that you were taking. Some women need to maintain a low dose of Reglan for a long  time. Other mothers find that they can stop taking raglan and keep making enough milk.  Reglan helps about 80% of mothers make more milk. It is very important that you empty your breasts as completely as possible at least eight times each 24 hours.  Fenugreek (Trigonella foenum-graecum):  Fenugreek is an herb which is mostly used in the [place] to flavor artificial maple syrup. It has also been used to increase mothers' milk supply.  Fenugreek is not recommended if you are pregnant, if you have asthma, or if you have diabetes. Fenugreek is sold in health food stores in tea and capsule  forms.  Mothers who take too much Fenugreek sometimes notice that their skin and urine smell like maple syrup. Some mothers have noticed loose bowel movements when they take a large amount of Fenugreek. Many mothers take Fenugreek for several months.  The usual dosage of Reglan that is given to mothers to increase their milk supply is: 10-15mg  by mouth three times a day for two weeks. The usual dosage of Fenugreek that is recommended for mothers to increase their milk supply is: 3 capsules three times each day 3-4 cups of tea per day It usually takes 3 to 5 days to notice much difference in your milk supply.

## 2021-04-25 NOTE — Telephone Encounter (Signed)
C/s done.

## 2021-05-30 ENCOUNTER — Encounter: Payer: Self-pay | Admitting: Nurse Practitioner

## 2021-05-30 ENCOUNTER — Ambulatory Visit: Payer: 59 | Admitting: Nurse Practitioner

## 2021-05-30 ENCOUNTER — Other Ambulatory Visit: Payer: Self-pay

## 2021-05-30 VITALS — BP 107/70 | HR 89 | Temp 98.4°F | Resp 16 | Ht 65.0 in | Wt 188.4 lb

## 2021-05-30 DIAGNOSIS — G43019 Migraine without aura, intractable, without status migrainosus: Secondary | ICD-10-CM | POA: Diagnosis not present

## 2021-05-30 DIAGNOSIS — I7300 Raynaud's syndrome without gangrene: Secondary | ICD-10-CM | POA: Diagnosis not present

## 2021-05-30 MED ORDER — NIFEDIPINE ER OSMOTIC RELEASE 30 MG PO TB24: 30.0000 mg | ORAL_TABLET | Freq: Every day | ORAL | 0 refills | Status: DC

## 2021-05-30 NOTE — Progress Notes (Signed)
Kathryn Estrada [address] [place], [place] [address]  Internal MEDICINE  Office Visit Note  Patient Name: Kathryn Estrada  [id number]  [id number]  Date of Service: 06/02/2021  Chief Complaint  Patient presents with   Acute Visit    Migraine for the last 3 weeks,      HPI Kathryn Estrada presents for an acute sick visit due to a migraine lasting for the past 3 weeks.  She is accompanied to her appointment by her mother and her 27-week-old daughter Kathryn Estrada.  The migraine is new onset with photosensitivity and blurred vision.  She reports that her right parietal scalp is tender to touch and she denies nausea.  She was given a dose of Ubrelvy in the office and noted that the migraine was easing off prior to the end of her visit.  Samples were sent home for Ubrelvy with the patient.   The patient also reports that she has been previously diagnosed with Raynaud's phenomenon related to breast-feeding.  Pumping causes intense pain.  She has not tried any medications recommended for Raynaud's.   Current Medication:  Outpatient Encounter Medications as of 05/30/2021  Medication Sig   calcium carbonate (TUMS - DOSED IN MG ELEMENTAL CALCIUM) 500 MG chewable tablet Chew 1 tablet by mouth daily.   docusate sodium (COLACE) 100 MG capsule Take 300 mg by mouth daily as needed for mild constipation.   ibuprofen (ADVIL) 800 MG tablet Take 1 tablet (800 mg total) by mouth every 8 (eight) hours.   NIFEdipine (PROCARDIA-XL/NIFEDICAL-XL) 30 MG 24 hr tablet Take 1 tablet (30 mg total) by mouth daily.   ondansetron (ZOFRAN ODT) 4 MG disintegrating tablet Take 1 tablet (4 mg total) by mouth every 6 (six) hours as needed for nausea.   oxyCODONE (OXY IR/ROXICODONE) 5 MG immediate release tablet Take 1 tablet (5 mg total) by mouth every 4 (four) hours as needed for moderate pain.   Prenatal Vit-Fe Fumarate-FA (PRENATAL VITAMIN AND MINERAL) 28-0.8 MG TABS Take 1 tablet by mouth daily.   No  facility-administered encounter medications on file as of 05/30/2021.      Medical History: Past Medical History:  Diagnosis Date   Complication of anesthesia    needs a good amount of anesthesia to get numb   Raynaud's syndrome without gangrene      Vital Signs: BP 107/70   Pulse 89   Temp 98.4 F (36.9 C)   Resp 16   Ht 5\' 5"  (1.651 m)   Wt 188 lb 6.4 oz (85.5 kg)   SpO2 99%   BMI 31.35 kg/m    Review of Systems  Constitutional:  Positive for fatigue. Negative for appetite change, chills and fever.  HENT: Negative.    Eyes:  Positive for photophobia, pain and visual disturbance.  Respiratory: Negative.  Negative for cough and shortness of breath.   Cardiovascular: Negative.  Negative for chest pain.  Gastrointestinal:  Negative for abdominal pain, diarrhea, nausea and vomiting.  Skin:  Positive for color change (prior dx of Raynaud's).  Neurological:  Positive for headaches.   Physical Exam Vitals reviewed.  Constitutional:      General: She is not in acute distress.    Appearance: Normal appearance. She is well-developed. She is obese. She is not ill-appearing or diaphoretic.  HENT:     Head: Normocephalic and atraumatic.     Comments: Right parietal area of scalp tender to palpation Eyes:     Extraocular Movements: Extraocular movements intact.  Conjunctiva/sclera: Conjunctivae normal.     Pupils: Pupils are equal, round, and reactive to light.  Neck:     Thyroid: No thyromegaly.     Vascular: No JVD.     Trachea: No tracheal deviation.  Cardiovascular:     Rate and Rhythm: Normal rate and regular rhythm.     Pulses: Normal pulses.     Heart sounds: Normal heart sounds. No murmur heard.   No friction rub. No gallop.  Pulmonary:     Effort: Pulmonary effort is normal. No respiratory distress.     Breath sounds: Normal breath sounds. No wheezing or rales.  Chest:     Chest wall: No tenderness.  Skin:    General: Skin is warm and dry.     Capillary  Refill: Capillary refill takes less than 2 seconds.  Neurological:     Mental Status: She is alert and oriented to person, place, and time.  Psychiatric:        Mood and Affect: Mood normal.        Behavior: Behavior normal.    Assessment/Plan: 1. Intractable migraine without aura and without status migrainosus Patient reports having a migraine for the past 3 weeks varying in intensity with some periods of time where the migraine was more severe and produced photosensitivity.  During the office visit she reported that she was still having the migraine, a one-time oral dose of 100 mg of Roselyn Meier was given to the patient.  The medication was explained to the patient prior to her taking it and she acknowledged understanding.  The medication itself is fairly new and it is unknown if it can pass to breastmilk.  Patient was instructed to pump and dump x24 hours since there is no current information regarding Ubrelvy, breastmilk, and its safety in infants.  She was provided with 2 extra sample doses of Ubrelvy.  If the patient finds that this medication is effective for aborting a current migraine, a prescription will be sent to her pharmacy.  She was also provided with the co-pay assistance card so that she will already have it available if a prescription is sent.  We will follow-up with patient in 4 weeks to discuss her migraine.  At this time, no migraine preventive has been prescribed.  The patient was informed that no further labs or imaging or other testing will be done unless she has 1 or more migraines not counting the first 1.  We will consider labs, imaging and other testing as necessary if she continues to have migraines in the future.  2. Raynaud's phenomenon without gangrene The patient was previously diagnosed with Raynaud's phenomenon she has researched this condition including presenting features in lactating women.  It is possible that Raynaud's can cause intense pain in the breast and nipple  area.  The patient has been experiencing this.  The patient exclusively pumps because her 14-week-old daughter Kathryn Estrada is unable to latch.  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  The patient experiences intense breast and nipple pain when pumping.  A low-dose of nifedipine is the recommended treatment for breast-feeding mothers.  Side effects of this medication were discussed with the patient and are fairly minimal.  There is a potential for nifedipine to cause a headache as a side effect.  The probability of having a headache as a side effect was low but the patient was made aware of this potential since  she has been experiencing of severe and prolonged migraine.  The patient was instructed to call the clinic or send a MyChart message if the side effects are too much for her to be able to take the medication or if the medication is working and alleviating the Raynaud's symptoms.  We will follow-up with patient in 4 weeks to evaluate status. - NIFEdipine (PROCARDIA-XL/NIFEDICAL-XL) 30 MG 24 hr tablet; Take 1 tablet (30 mg total) by mouth daily.  Dispense: 30 tablet; Refill: 0   General Counseling: Kathryn Estrada verbalizes understanding of the findings of todays visit and agrees with plan of treatment. I have discussed any further diagnostic evaluation that may be needed or ordered today. We also reviewed her medications today. she has been encouraged to call the office with any questions or concerns that should arise related to todays visit.    Counseling:    No orders of the defined types were placed in this encounter.   Meds ordered this encounter  Medications   NIFEdipine (PROCARDIA-XL/NIFEDICAL-XL) 30 MG 24 hr tablet    Sig: Take 1 tablet (30 mg total) by mouth daily.    Dispense:  30 tablet    Refill:  0   Return in about 4 weeks (around 06/27/2021) for F/U, raynauds and migraines, Tammy Ericsson PCP.  [place] Controlled Substance  Database was reviewed by me.  Time spent:30 Minutes  This patient was seen by Jonetta Osgood, FNP-C in collaboration with Dr. Clayborn Bigness as a part of collaborative care agreement.  Sunaina Ferrando R. Valetta Fuller, MSN, FNP-C Internal Medicine

## 2021-06-27 ENCOUNTER — Ambulatory Visit: Payer: Medicaid Other | Admitting: Nurse Practitioner

## 2021-07-06 ENCOUNTER — Other Ambulatory Visit: Payer: Self-pay

## 2021-07-06 ENCOUNTER — Encounter: Payer: Self-pay | Admitting: Physician Assistant

## 2021-07-06 ENCOUNTER — Ambulatory Visit: Payer: 59 | Admitting: Physician Assistant

## 2021-07-06 DIAGNOSIS — K649 Unspecified hemorrhoids: Secondary | ICD-10-CM

## 2021-07-06 DIAGNOSIS — K625 Hemorrhage of anus and rectum: Secondary | ICD-10-CM

## 2021-07-06 DIAGNOSIS — N946 Dysmenorrhea, unspecified: Secondary | ICD-10-CM | POA: Diagnosis not present

## 2021-07-06 MED ORDER — HYDROCORTISONE ACETATE 25 MG RE SUPP: RECTAL | 0 refills | Status: DC

## 2021-07-06 MED ORDER — DULOXETINE HCL 20 MG PO CPEP: 20.0000 mg | ORAL_CAPSULE | Freq: Every day | ORAL | 3 refills | Status: DC

## 2021-07-06 NOTE — Progress Notes (Signed)
[facility] [place], [place] [address]  Internal MEDICINE  Office Visit Note  Patient Name: Kathryn Estrada  [id number]  [id number]  Date of Service: 07/06/2021  Chief Complaint  Patient presents with   Acute Visit    Rectal bleeding, discuss cycle     HPI Pt is here for sick visit -Gave birth to her baby April 03, 2021. Stopped breast feeding bc of raynauds affecting nipples making feeding very painful and baby unable to latch. Was pumping then but could not keep up with it and supply started to wean down and has now stopped. Martin Majestic to OBGYN for initial postpartum visit, but missed her next follow up that was 6 weeks ago. Pt encouraged to follow back up with OBGYN now. -Cycle came back 3 days ago. Cycle is normally 5-6 days. States she has always had terrible cycles that nothing has been help with. She states she has previously tried birth control, heating pads, baths, etc and nothing has helped. -She is nauseous and has terrible pain currently. She is taking a lot of aleve bc it is the only thing that helps her. States she only started the aleve with cycle starting 3 days ago but has been taking more than the recommended dose. Did discuss the risks associated with NSAID use especially given current bleeding and pt understood that she needed to stop this. -She has known hemorrhoids--internal and external. Miralax helps her constipation, but her hemorrhoids are bleeding now. Two instances of blood in toilet due to these. Has not noticed any blood actually in the stool and is not dark and tarry appearing. Discussed also adding metamucil and increasing daily fiber to help with constipation. Also discussed using prepH to help soothe the external area and use of anusol as needed. Will refer to GI for further evaluation given bleeding and pain.  Current Medication: Outpatient Encounter Medications as of 07/06/2021  Medication Sig   Naproxen Sodium (ALEVE PO) Take by mouth.    [DISCONTINUED] DULoxetine (CYMBALTA) 20 MG capsule Take 1 capsule (20 mg total) by mouth daily.   [DISCONTINUED] hydrocortisone (ANUSOL-HC) 25 MG suppository Insert one after EACH bm QD   [DISCONTINUED] calcium carbonate (TUMS - DOSED IN MG ELEMENTAL CALCIUM) 500 MG chewable tablet Chew 1 tablet by mouth daily.   [DISCONTINUED] docusate sodium (COLACE) 100 MG capsule Take 300 mg by mouth daily as needed for mild constipation.   [DISCONTINUED] ibuprofen (ADVIL) 800 MG tablet Take 1 tablet (800 mg total) by mouth every 8 (eight) hours.   [DISCONTINUED] NIFEdipine (PROCARDIA-XL/NIFEDICAL-XL) 30 MG 24 hr tablet Take 1 tablet (30 mg total) by mouth daily.   [DISCONTINUED] ondansetron (ZOFRAN ODT) 4 MG disintegrating tablet Take 1 tablet (4 mg total) by mouth every 6 (six) hours as needed for nausea.   [DISCONTINUED] oxyCODONE (OXY IR/ROXICODONE) 5 MG immediate release tablet Take 1 tablet (5 mg total) by mouth every 4 (four) hours as needed for moderate pain.   [DISCONTINUED] Prenatal Vit-Fe Fumarate-FA (PRENATAL VITAMIN AND MINERAL) 28-0.8 MG TABS Take 1 tablet by mouth daily.   No facility-administered encounter medications on file as of 07/06/2021.    Surgical History: Past Surgical History:  Procedure Laterality Date   CESAREAN SECTION N/A 04/03/2021   Procedure: CESAREAN SECTION;  Surgeon: Homero Fellers, MD;  Location: [facility] ORS;  Service: Obstetrics;  Laterality: N/A;    Medical History: Past Medical History:  Diagnosis Date   Complication of anesthesia    needs a good amount of anesthesia  to get numb   Raynaud's syndrome without gangrene     Family History: Family History  Problem Relation Age of Onset   Neurofibromatosis Mother    Anemia Mother    Diabetes Other     Social History   Socioeconomic History   Marital status: Married    Spouse name: Dorothea Ogle   Number of children: Not on file   Years of education: Not on file   Highest education level: Not on file   Occupational History   Not on file  Tobacco Use   Smoking status: Former    Types: Cigarettes, E-cigarettes    Quit date: 08/28/2020    Years since quitting: 0.8   Smokeless tobacco: Never  Vaping Use   Vaping Use: Every day   Last attempt to quit: 08/28/2020   Devices: vape   Substance and Sexual Activity   Alcohol use: Not Currently    Comment: ocassinally/ once a month    Drug use: Never   Sexual activity: Yes    Birth control/protection: None    Comment: undecided  Other Topics Concern   Not on file  Social History Narrative   Not on file   Social Determinants of Health   Financial Resource Strain: Not on file  Food Insecurity: Not on file  Transportation Needs: Not on file  Physical Activity: Not on file  Stress: Not on file  Social Connections: Not on file  Intimate Partner Violence: Not on file      Review of Systems  Constitutional:  Negative for chills, fatigue and unexpected weight change.  HENT:  Negative for congestion, postnasal drip, rhinorrhea, sneezing and sore throat.   Eyes:  Negative for redness.  Respiratory:  Negative for cough, chest tightness and shortness of breath.   Cardiovascular:  Negative for chest pain and palpitations.  Gastrointestinal:  Positive for abdominal pain, anal bleeding, constipation and nausea. Negative for blood in stool, diarrhea and vomiting.  Genitourinary:  Positive for pelvic pain and vaginal bleeding. Negative for dysuria and frequency.  Musculoskeletal:  Negative for arthralgias, back pain, joint swelling and neck pain.  Skin:  Negative for rash.  Neurological: Negative.  Negative for tremors and numbness.  Hematological:  Negative for adenopathy. Does not bruise/bleed easily.  Psychiatric/Behavioral:  Negative for behavioral problems (Depression), sleep disturbance and suicidal ideas. The patient is not nervous/anxious.    Vital Signs: BP 116/78   Pulse (!) 105   Temp 97.8 F (36.6 C)   Resp 16   Ht 5\' 5"   (1.651 m)   Wt 183 lb 3.2 oz (83.1 kg)   SpO2 99%   BMI 30.49 kg/m    Physical Exam Vitals and nursing note reviewed.  Constitutional:      General: She is not in acute distress.    Appearance: She is well-developed. She is obese. She is not diaphoretic.  HENT:     Head: Normocephalic and atraumatic.     Mouth/Throat:     Pharynx: No oropharyngeal exudate.  Eyes:     Pupils: Pupils are equal, round, and reactive to light.  Neck:     Thyroid: No thyromegaly.     Vascular: No JVD.     Trachea: No tracheal deviation.  Cardiovascular:     Rate and Rhythm: Normal rate and regular rhythm.     Heart sounds: Normal heart sounds. No murmur heard.   No friction rub. No gallop.  Pulmonary:     Effort: Pulmonary effort is normal. No respiratory  distress.     Breath sounds: No wheezing or rales.  Chest:     Chest wall: No tenderness.  Abdominal:     General: Bowel sounds are normal.     Palpations: Abdomen is soft.  Musculoskeletal:        General: Normal range of motion.     Cervical back: Normal range of motion and neck supple.  Lymphadenopathy:     Cervical: No cervical adenopathy.  Skin:    General: Skin is warm and dry.  Neurological:     Mental Status: She is alert and oriented to person, place, and time.     Cranial Nerves: No cranial nerve deficit.  Psychiatric:        Behavior: Behavior normal.        Thought Content: Thought content normal.        Judgment: Judgment normal.       Assessment/Plan: 1. Rectal bleeding Will update CBC to evaluate H&H and iron levels. Will also refer to GI for further evaluation. Pt will call office if any worsening or noticeable blood in stool/dark or tarry stools in the meantime. - CBC w/Diff/Platelet - Iron, TIBC and Ferritin Panel - Ambulatory referral to Gastroenterology  2. Hemorrhoids, unspecified hemorrhoid type Recommend increasing fiber intake with metamucil and miralax as needed. May use prepH and anusol as needed to  help with pain and inflammation from hemorrhoids. Refer to GI for further eval and management - Ambulatory referral to Gastroenterology  3. Painful menstrual periods Long hx of painful periods that have not been controlled with birth control previously. Pt is no longer breast feeding. Will try cymbalta to help with pain associated with periods while patient follows up with OBGYN as part of her postpartum care. May need to consider alternative medication or titration of cymbalta. Educated to avoid NSAIDs with current bleeding and to use tylenol as needed.  4. Cesarean delivery delivered Pt is 3 months postpartum. She will schedule follow up with OBGYN.   General Counseling: sula fetterly understanding of the findings of todays visit and agrees with plan of treatment. I have discussed any further diagnostic evaluation that may be needed or ordered today. We also reviewed her medications today. she has been encouraged to call the office with any questions or concerns that should arise related to todays visit.    Orders Placed This Encounter  Procedures   CBC w/Diff/Platelet   Iron, TIBC and Ferritin Panel   Ambulatory referral to Gastroenterology    Meds ordered this encounter  Medications   DISCONTD: hydrocortisone (ANUSOL-HC) 25 MG suppository    Sig: Insert one after EACH bm QD    Dispense:  25 suppository    Refill:  0   DISCONTD: DULoxetine (CYMBALTA) 20 MG capsule    Sig: Take 1 capsule (20 mg total) by mouth daily.    Dispense:  30 capsule    Refill:  3    This patient was seen by Drema Dallas, PA-C in collaboration with Dr. Clayborn Bigness as a part of collaborative care agreement.   Total time spent:35 Minutes Time spent includes review of chart, medications, test results, and follow up plan with the patient.      Dr Lavera Guise Internal medicine

## 2021-08-13 ENCOUNTER — Encounter: Payer: Self-pay | Admitting: *Deleted

## 2021-08-16 ENCOUNTER — Telehealth: Payer: Self-pay

## 2021-08-16 NOTE — Telephone Encounter (Signed)
Left vm and mychart message for patient to return my call regarding gastroenterologist referral-Toni

## 2021-09-17 ENCOUNTER — Ambulatory Visit: Payer: Medicaid Other | Admitting: Obstetrics and Gynecology

## 2021-10-12 ENCOUNTER — Ambulatory Visit: Payer: Medicaid Other | Admitting: Physician Assistant

## 2021-10-25 LAB — IRON,TIBC AND FERRITIN PANEL
Ferritin: 44 ng/mL (ref 15–150)
Iron Saturation: 30 % (ref 15–55)
Iron: 93 ug/dL (ref 27–159)
Total Iron Binding Capacity: 311 ug/dL (ref 250–450)
UIBC: 218 ug/dL (ref 131–425)

## 2021-10-25 LAB — CBC WITH DIFFERENTIAL/PLATELET
Basophils Absolute: 0.1 10*3/uL (ref 0.0–0.2)
Basos: 1 %
EOS (ABSOLUTE): 0.2 10*3/uL (ref 0.0–0.4)
Eos: 2 %
Hematocrit: 44.1 % (ref 34.0–46.6)
Hemoglobin: 14.1 g/dL (ref 11.1–15.9)
Immature Grans (Abs): 0 10*3/uL (ref 0.0–0.1)
Immature Granulocytes: 0 %
Lymphocytes Absolute: 2 10*3/uL (ref 0.7–3.1)
Lymphs: 23 %
MCH: 28 pg (ref 26.6–33.0)
MCHC: 32 g/dL (ref 31.5–35.7)
MCV: 88 fL (ref 79–97)
Monocytes Absolute: 0.6 10*3/uL (ref 0.1–0.9)
Monocytes: 7 %
Neutrophils Absolute: 5.8 10*3/uL (ref 1.4–7.0)
Neutrophils: 67 %
Platelets: 247 10*3/uL (ref 150–450)
RBC: 5.03 x10E6/uL (ref 3.77–5.28)
RDW: 12.8 % (ref 11.7–15.4)
WBC: 8.6 10*3/uL (ref 3.4–10.8)

## 2021-11-05 ENCOUNTER — Other Ambulatory Visit: Payer: Self-pay

## 2021-11-05 ENCOUNTER — Ambulatory Visit: Payer: 59 | Admitting: Physician Assistant

## 2021-11-05 ENCOUNTER — Encounter: Payer: Self-pay | Admitting: Physician Assistant

## 2021-11-05 VITALS — BP 138/80 | HR 99 | Temp 98.0°F | Resp 16 | Ht 65.0 in | Wt 191.0 lb

## 2021-11-05 DIAGNOSIS — R200 Anesthesia of skin: Secondary | ICD-10-CM

## 2021-11-05 DIAGNOSIS — Z8279 Family history of other congenital malformations, deformations and chromosomal abnormalities: Secondary | ICD-10-CM

## 2021-11-05 DIAGNOSIS — R519 Headache, unspecified: Secondary | ICD-10-CM | POA: Diagnosis not present

## 2021-11-05 DIAGNOSIS — R202 Paresthesia of skin: Secondary | ICD-10-CM | POA: Diagnosis not present

## 2021-11-05 DIAGNOSIS — G8929 Other chronic pain: Secondary | ICD-10-CM

## 2021-11-05 NOTE — Progress Notes (Signed)
[facility] [place], [place] [address]  Internal MEDICINE  Office Visit Note  Patient Name: Kathryn Estrada  [id number]  [id number]  Date of Service: 11/06/2021  Chief Complaint  Patient presents with   Follow-up   Headache    Numbness on left side of face    Fatigue    HPI Pt is here for routine follow up -Did not see GI because wants to have more kids and figured hemorrhoids would come back with more kids even if she had them removed and since they stopped bleeding and are not painful now decided to hold off. -Still having headaches and gets numbness in her face that comes and go with the headaches. Mom has NF and older siblings have NF. Usually has pain in neck first and then is followed by face numbness. States it is not really painful but goes along left cheek and occasionally feels like she cant smile normally when this happens. Has not found any trigger and it goes away on its own. -headaches began after she gave birth and have calmed down but are now more annoying than anything, especially when the  numbness starts up -foggy at times as well -Does have a hx of low B12 and discussed starting with labs, but may need to consider neurology referral  Current Medication: Outpatient Encounter Medications as of 11/05/2021  Medication Sig   Naproxen Sodium (ALEVE PO) Take by mouth.   [DISCONTINUED] DULoxetine (CYMBALTA) 20 MG capsule Take 1 capsule (20 mg total) by mouth daily.   [DISCONTINUED] hydrocortisone (ANUSOL-HC) 25 MG suppository Insert one after EACH bm QD   No facility-administered encounter medications on file as of 11/05/2021.    Surgical History: Past Surgical History:  Procedure Laterality Date   CESAREAN SECTION N/A 04/03/2021   Procedure: CESAREAN SECTION;  Surgeon: Homero Fellers, MD;  Location: [facility] ORS;  Service: Obstetrics;  Laterality: N/A;    Medical History: Past Medical History:  Diagnosis Date   Complication of  anesthesia    needs a good amount of anesthesia to get numb   Raynaud's syndrome without gangrene     Family History: Family History  Problem Relation Age of Onset   Neurofibromatosis Mother    Anemia Mother    Diabetes Other     Social History   Socioeconomic History   Marital status: Married    Spouse name: Dorothea Ogle   Number of children: Not on file   Years of education: Not on file   Highest education level: Not on file  Occupational History   Not on file  Tobacco Use   Smoking status: Former    Types: Cigarettes, E-cigarettes    Quit date: 08/28/2020    Years since quitting: 1.1   Smokeless tobacco: Never  Vaping Use   Vaping Use: Every day   Last attempt to quit: 08/28/2020   Devices: vape   Substance and Sexual Activity   Alcohol use: Not Currently    Comment: ocassinally/ once a month    Drug use: Never   Sexual activity: Yes    Birth control/protection: None    Comment: undecided  Other Topics Concern   Not on file  Social History Narrative   Not on file   Social Determinants of Health   Financial Resource Strain: Not on file  Food Insecurity: Not on file  Transportation Needs: Not on file  Physical Activity: Not on file  Stress: Not on file  Social Connections: Not on file  Intimate Partner Violence: Not on file      Review of Systems  Constitutional:  Positive for fatigue. Negative for chills and unexpected weight change.  HENT:  Negative for congestion, postnasal drip, rhinorrhea, sneezing and sore throat.   Eyes:  Negative for redness.  Respiratory:  Negative for cough, chest tightness and shortness of breath.   Cardiovascular:  Negative for chest pain and palpitations.  Gastrointestinal:  Negative for abdominal pain, constipation, diarrhea, nausea and vomiting.  Genitourinary:  Negative for dysuria and frequency.  Musculoskeletal:  Positive for neck pain. Negative for back pain and joint swelling.  Skin:  Negative for rash.  Neurological:   Positive for numbness and headaches. Negative for tremors.  Hematological:  Negative for adenopathy. Does not bruise/bleed easily.  Psychiatric/Behavioral:  Negative for behavioral problems (Depression), sleep disturbance and suicidal ideas. The patient is nervous/anxious.    Vital Signs: BP 138/80   Pulse 99   Temp 98 F (36.7 C)   Resp 16   Ht 5\' 5"  (1.651 m)   Wt 191 lb (86.6 kg)   SpO2 99%   BMI 31.78 kg/m    Physical Exam Vitals and nursing note reviewed.  Constitutional:      General: She is not in acute distress.    Appearance: She is well-developed. She is obese. She is not diaphoretic.  HENT:     Head: Normocephalic and atraumatic.     Mouth/Throat:     Pharynx: No oropharyngeal exudate.  Eyes:     Pupils: Pupils are equal, round, and reactive to light.  Neck:     Thyroid: No thyromegaly.     Vascular: No JVD.     Trachea: No tracheal deviation.  Cardiovascular:     Rate and Rhythm: Normal rate and regular rhythm.     Heart sounds: Normal heart sounds. No murmur heard.   No friction rub. No gallop.  Pulmonary:     Effort: Pulmonary effort is normal. No respiratory distress.     Breath sounds: No wheezing or rales.  Chest:     Chest wall: No tenderness.  Abdominal:     General: Bowel sounds are normal.     Palpations: Abdomen is soft.  Musculoskeletal:        General: Normal range of motion.     Cervical back: Normal range of motion and neck supple.  Lymphadenopathy:     Cervical: No cervical adenopathy.  Skin:    General: Skin is warm and dry.  Neurological:     General: No focal deficit present.     Mental Status: She is alert and oriented to person, place, and time.     Cranial Nerves: No cranial nerve deficit or facial asymmetry.     Sensory: No sensory deficit.  Psychiatric:        Behavior: Behavior normal.        Thought Content: Thought content normal.        Judgment: Judgment normal.       Assessment/Plan: 1. Numbness and  tingling Will start by checking labs, may need neurology referral pending results and if symptoms continue/worsen - B12 and Folate Panel - TSH + free T4  2. Chronic nonintractable headache, unspecified headache type Chronic headaches that are manageable originating from neck tension and that come and go on their own. Will try heating pads and tylenol prn. If worsening can consider migraine prevention or abortive therapy. May need neurology referral pending labs and if symptoms worsening  3. Family  history of neurofibromatosis May need further eval and/or neurology referral  General Counseling: meral geissinger understanding of the findings of todays visit and agrees with plan of treatment. I have discussed any further diagnostic evaluation that may be needed or ordered today. We also reviewed her medications today. she has been encouraged to call the office with any questions or concerns that should arise related to todays visit.    Orders Placed This Encounter  Procedures   B12 and Folate Panel   TSH + free T4    No orders of the defined types were placed in this encounter.   This patient was seen by Drema Dallas, PA-C in collaboration with Dr. Clayborn Bigness as a part of collaborative care agreement.   Total time spent:30 Minutes Time spent includes review of chart, medications, test results, and follow up plan with the patient.      Dr Lavera Guise Internal medicine

## 2021-11-29 LAB — TSH+FREE T4
Free T4: 0.91 ng/dL (ref 0.82–1.77)
TSH: 4.13 u[IU]/mL (ref 0.450–4.500)

## 2021-11-29 LAB — B12 AND FOLATE PANEL
Folate: 5.9 ng/mL (ref >3.0)
Vitamin B-12: 179 pg/mL — ABNORMAL LOW (ref 232–1245)

## 2021-11-30 ENCOUNTER — Encounter: Payer: Self-pay | Admitting: Nurse Practitioner

## 2021-11-30 ENCOUNTER — Ambulatory Visit: Payer: 59 | Admitting: Nurse Practitioner

## 2021-11-30 ENCOUNTER — Other Ambulatory Visit: Payer: Self-pay

## 2021-11-30 VITALS — BP 130/86 | HR 97 | Temp 98.6°F | Resp 16 | Ht 65.0 in | Wt 192.0 lb

## 2021-11-30 DIAGNOSIS — R42 Dizziness and giddiness: Secondary | ICD-10-CM

## 2021-11-30 DIAGNOSIS — Z8279 Family history of other congenital malformations, deformations and chromosomal abnormalities: Secondary | ICD-10-CM | POA: Diagnosis not present

## 2021-11-30 DIAGNOSIS — H539 Unspecified visual disturbance: Secondary | ICD-10-CM

## 2021-11-30 DIAGNOSIS — R200 Anesthesia of skin: Secondary | ICD-10-CM

## 2021-11-30 DIAGNOSIS — L813 Cafe au lait spots: Secondary | ICD-10-CM

## 2021-11-30 DIAGNOSIS — E538 Deficiency of other specified B group vitamins: Secondary | ICD-10-CM | POA: Diagnosis not present

## 2021-11-30 MED ORDER — CYANOCOBALAMIN 1000 MCG/ML IJ SOLN
1000.0000 ug | Freq: Once | INTRAMUSCULAR | Status: AC
  Administered 2021-11-30: 1000 ug via INTRAMUSCULAR

## 2021-11-30 MED ORDER — MECLIZINE HCL 25 MG PO TABS: 25.0000 mg | ORAL_TABLET | Freq: Three times a day (TID) | ORAL | 0 refills | Status: DC | PRN

## 2021-11-30 NOTE — Progress Notes (Signed)
[facility] [place], [place] [address]  Internal MEDICINE  Office Visit Note  Patient Name: Kathryn Estrada  [id number]  [id number]  Date of Service: 11/30/2021  Chief Complaint  Patient presents with   Acute Visit    Numbness in face, neck pain, started about 5 months ago, slowly getting worse, vision may be off, wants to be checked for NF neurofibramatosis which runs in the family    Dizziness   Headache     HPI Shaasia presents for an acute sick visit for numbness of the left side of the face, visual disturbance, and left-sided neck pain.  She also reports that the headaches have returned.  These symptoms started approximately 5 months ago.  She has a history of Raynaud's phenomenon which occurs in her hands and feet.  She denies any facial pain, paralysis or asymmetry.  She reports that it is just numbness and tingling on the left side of her face under her eye.  She reports that she has difficulty focusing her left eye, it is blurred sometimes and states she sees black spots sometimes. She has significant family history of neurofibromatosis.  The people in her family who have been diagnosed with neurofibromatosis include her mother, 2 brothers, 1 sister, maternal grandfather, great maternal grandmother, great great maternal grandfather.  She also has several uncles and several cousins who have been diagnosed with neurofibromatosis.  She has a large caf au lait spot on the right buttocks and a subcutaneous nodule on the left lower chest wall below the left breast.    Current Medication:  Outpatient Encounter Medications as of 11/30/2021  Medication Sig   meclizine (ANTIVERT) 25 MG tablet Take 1 tablet (25 mg total) by mouth 3 (three) times daily as needed for dizziness.   Naproxen Sodium (ALEVE PO) Take by mouth.   [EXPIRED] cyanocobalamin ((VITAMIN B-12)) injection 1,000 mcg    No facility-administered encounter medications on file as of 11/30/2021.       Medical History: Past Medical History:  Diagnosis Date   Complication of anesthesia    needs a good amount of anesthesia to get numb   Raynaud's syndrome without gangrene      Vital Signs: BP 130/86   Pulse 97   Temp 98.6 F (37 C)   Resp 16   Ht 5\' 5"  (1.651 m)   Wt 192 lb (87.1 kg)   SpO2 100%   BMI 31.95 kg/m    Review of Systems  Constitutional:  Negative for chills, fatigue and unexpected weight change.  HENT:  Negative for congestion, rhinorrhea, sneezing and sore throat.   Eyes:  Positive for visual disturbance (left eye). Negative for redness.  Respiratory:  Negative for cough, chest tightness, shortness of breath and wheezing.   Cardiovascular:  Negative for chest pain and palpitations.  Gastrointestinal:  Negative for abdominal pain, constipation, diarrhea, nausea and vomiting.  Genitourinary:  Negative for dysuria and frequency.  Musculoskeletal:  Positive for neck pain (left sided). Negative for arthralgias, back pain and joint swelling.  Skin:  Negative for rash.       Brown birthmark to right buttocks, subcutaneous nodule left chest  Neurological:  Positive for numbness (numbness and tingling on left side of face below left eye). Negative for tremors.  Hematological:  Negative for adenopathy. Does not bruise/bleed easily.  Psychiatric/Behavioral:  Negative for behavioral problems (Depression), sleep disturbance and suicidal ideas. The patient is not nervous/anxious.    Physical Exam Vitals reviewed.  Constitutional:  General: She is not in acute distress.    Appearance: Normal appearance. She is well-developed. She is not ill-appearing.  HENT:     Head: Normocephalic and atraumatic.  Eyes:     General: Lids are normal. Vision grossly intact. Gaze aligned appropriately.     Pupils: Pupils are equal, round, and reactive to light.  Neck:     Thyroid: No thyromegaly.     Trachea: Trachea and phonation normal.  Cardiovascular:     Rate and  Rhythm: Normal rate and regular rhythm.     Comments: Red purple color to feet, bilateral toes and feet cold to touch, patient has raynauds Pulmonary:     Effort: Pulmonary effort is normal. No respiratory distress.  Chest:     Chest wall: Mass (subcutaneous nodule in left lower chest wall below left breast) present.  Musculoskeletal:     Cervical back: Pain with movement and muscular tenderness present. Decreased range of motion.  Skin:    Comments: Large cafe au lait spot on right buttocks  Neurological:     Mental Status: She is alert.  Psychiatric:        Mood and Affect: Mood normal.        Behavior: Behavior normal.      Assessment/Plan: 1. Monocular visual disturbance MRI ordered to evaluate for neurofibromatosis. Patient has prominent family history of neurofibromatosis, left sided visual eye disturbance, left facial numbness, and a large cafe au late spot on right buttocks. Will refer to neurology after MRIs have been done and results have been discussed.  - MR Brain W Wo Contrast; Future - MR TOTAL SPINE W WO CONTRAST; Future  2. Left facial numbness See problem #1 - MR Brain W Wo Contrast; Future - MR TOTAL SPINE W WO CONTRAST; Future  3. Family history of neurofibromatosis See problem #1 - MR Brain W Wo Contrast; Future - MR TOTAL SPINE W WO CONTRAST; Future  4. Spot, cafe-au-lait See problem #1 - MR Brain W Wo Contrast; Future - MR TOTAL SPINE W WO CONTRAST; Future  5. Dizziness Persisten dizziness with headaches. Meclizine prescribed, this problem may also be related to the facial numbness and visual disturbance.  - meclizine (ANTIVERT) 25 MG tablet; Take 1 tablet (25 mg total) by mouth 3 (three) times daily as needed for dizziness.  Dispense: 30 tablet; Refill: 0  6. B12 deficiency Severely low B12, B12 injection administered in office today, repeat weekly for 2 more weeks then continue injections monthly x3-6 months. Will repeat B12 level in 3-6 months.   - cyanocobalamin ((VITAMIN B-12)) injection 1,000 mcg   General Counseling: Madaleine verbalizes understanding of the findings of todays visit and agrees with plan of treatment. I have discussed any further diagnostic evaluation that may be needed or ordered today. We also reviewed her medications today. she has been encouraged to call the office with any questions or concerns that should arise related to todays visit.    Counseling:    Orders Placed This Encounter  Procedures   MR Brain W Wo Contrast   MR TOTAL SPINE W WO CONTRAST    Meds ordered this encounter  Medications   cyanocobalamin ((VITAMIN B-12)) injection 1,000 mcg   meclizine (ANTIVERT) 25 MG tablet    Sig: Take 1 tablet (25 mg total) by mouth 3 (three) times daily as needed for dizziness.    Dispense:  30 tablet    Refill:  0    Return in about 4 weeks (around 12/28/2021) for  F/U, Review labs/test, Gervis Gaba PCP.  [place] Controlled Substance Database was reviewed by me for overdose risk score (ORS)  Time spent:30 Minutes Time spent with patient included reviewing progress notes, labs, imaging studies, and discussing plan for follow up.   This patient was seen by Jonetta Osgood, FNP-C in collaboration with Dr. Clayborn Bigness as a part of collaborative care agreement.  Taysen Bushart R. Valetta Fuller, MSN, FNP-C Internal Medicine

## 2021-12-03 NOTE — Addendum Note (Signed)
Addended by: Jonetta Osgood on: 12/03/2021 01:11 PM   Modules accepted: Orders

## 2021-12-04 ENCOUNTER — Telehealth: Payer: Self-pay

## 2021-12-04 NOTE — Telephone Encounter (Signed)
Patients mom called stating the patient was in terrible pain since yesterday having headaches. I advised the MRI was scheduled the first available per the schedulers ( I called again to make sure and they told me again this was the soonest available.). Verified with nurse who stated that they patient would have to go to the ER. Patients mom understood and stated they would call the daughter and talk to her about going to the ER. Mom is stating the patient may or may not go due to not wanting to wait for a long period of time.

## 2021-12-10 ENCOUNTER — Ambulatory Visit: Payer: 59

## 2021-12-10 ENCOUNTER — Other Ambulatory Visit: Payer: Self-pay

## 2021-12-10 DIAGNOSIS — E538 Deficiency of other specified B group vitamins: Secondary | ICD-10-CM

## 2021-12-14 ENCOUNTER — Other Ambulatory Visit: Payer: Self-pay

## 2021-12-14 ENCOUNTER — Ambulatory Visit
Admission: RE | Admit: 2021-12-14 | Discharge: 2021-12-14 | Disposition: A | Discharge status: Home / Self Care | Payer: 59 | Source: Ambulatory Visit | Attending: Nurse Practitioner | Admitting: Nurse Practitioner

## 2021-12-14 DIAGNOSIS — H539 Unspecified visual disturbance: Secondary | ICD-10-CM | POA: Diagnosis not present

## 2021-12-14 DIAGNOSIS — M5126 Other intervertebral disc displacement, lumbar region: Secondary | ICD-10-CM | POA: Insufficient documentation

## 2021-12-14 DIAGNOSIS — Z8279 Family history of other congenital malformations, deformations and chromosomal abnormalities: Secondary | ICD-10-CM

## 2021-12-14 DIAGNOSIS — R200 Anesthesia of skin: Secondary | ICD-10-CM

## 2021-12-14 DIAGNOSIS — L813 Cafe au lait spots: Secondary | ICD-10-CM

## 2021-12-14 DIAGNOSIS — M50221 Other cervical disc displacement at C4-C5 level: Secondary | ICD-10-CM | POA: Insufficient documentation

## 2021-12-14 DIAGNOSIS — J3489 Other specified disorders of nose and nasal sinuses: Secondary | ICD-10-CM | POA: Insufficient documentation

## 2021-12-14 DIAGNOSIS — M47812 Spondylosis without myelopathy or radiculopathy, cervical region: Secondary | ICD-10-CM | POA: Insufficient documentation

## 2021-12-14 DIAGNOSIS — M47814 Spondylosis without myelopathy or radiculopathy, thoracic region: Secondary | ICD-10-CM | POA: Insufficient documentation

## 2021-12-14 DIAGNOSIS — N281 Cyst of kidney, acquired: Secondary | ICD-10-CM | POA: Diagnosis not present

## 2021-12-14 DIAGNOSIS — M47816 Spondylosis without myelopathy or radiculopathy, lumbar region: Secondary | ICD-10-CM | POA: Diagnosis not present

## 2021-12-14 IMAGING — MR MR THORACIC SPINE W/O CM
6 series · 29 of 48 positions shown · non-contrast
Comparison: Same day cervical spine MRI [DATE].

CLINICAL DATA: Provided history: Family history of neural
fibromatosis. Spot, INKOGNITO. Left facial numbness. Monocular
vision disturbance. Neuro fibromatosis. Additional history provided
by scanning technologist: Patient reports a family history of neural
fibromatosis in mother, dizziness, periodic migraines, left facial
numbness.

EXAM:
MRI THORACIC SPINE WITHOUT CONTRAST
TECHNIQUE: Multiplanar, multisequence MR imaging of the thoracic spine was
performed. No intravenous contrast was administered.

[Series 16: T1 · sagittal · 5.0mm · 1.88mm/px · 2 of 9 slices shown (1 of 2)]
[im 1/9]
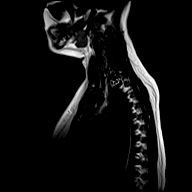
[im 9/9]
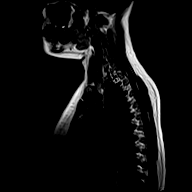

[Series 21: T2 · sagittal · 3.0mm · 1.06mm/px · 6 of 17 slices shown (1 of 2)]
[im 1/17]
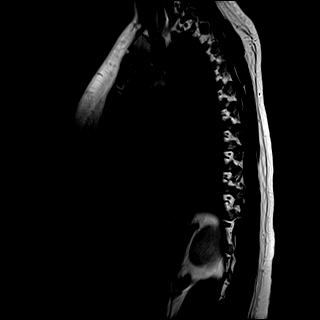
[im 4/17]
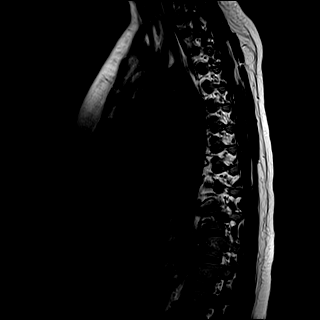
[im 7/17]
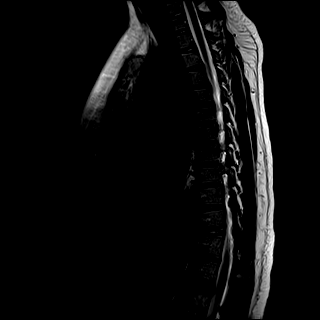
[im 10/17]
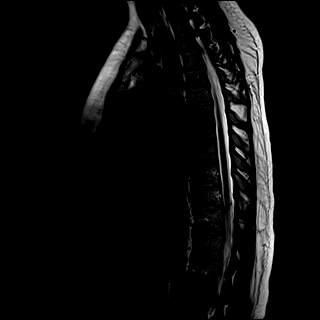
[im 13/17]
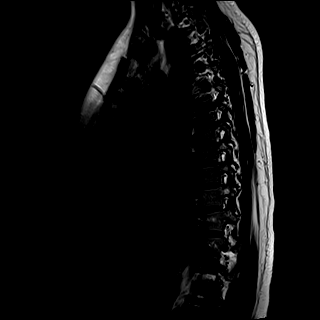
[im 17/17]
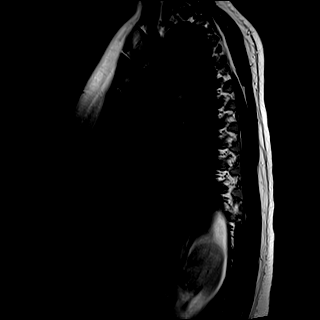

[Series 22: T1 · sagittal · 3.0mm · 1.06mm/px · 6 of 17 slices shown (2 of 2)]
[im 1/17]
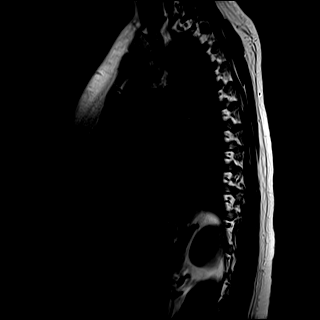
[im 4/17]
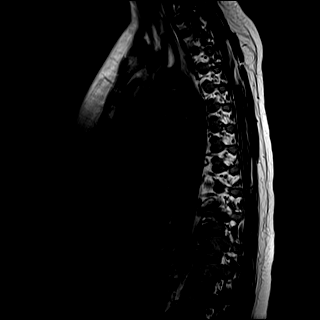
[im 7/17]
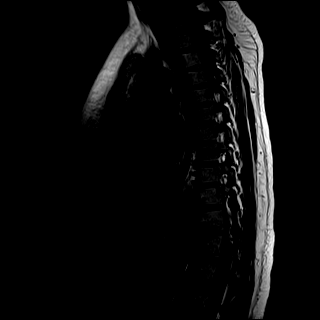
[im 10/17]
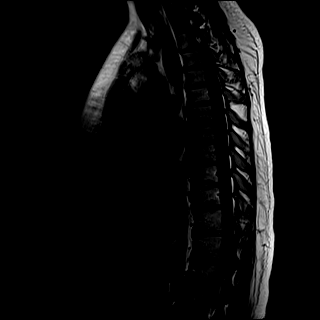
[im 13/17]
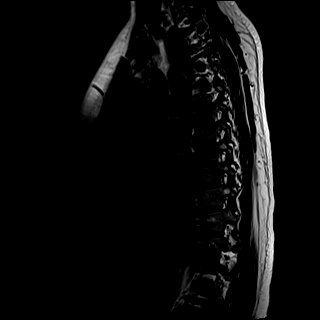
[im 17/17]
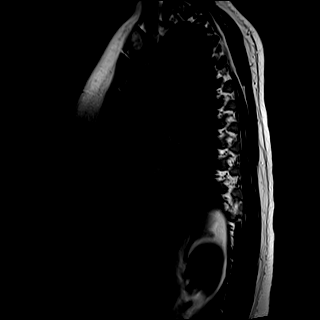

[Series 23: STIR · sagittal · 3.0mm · 0.53mm/px · 6 of 17 slices shown]
[im 1/17]
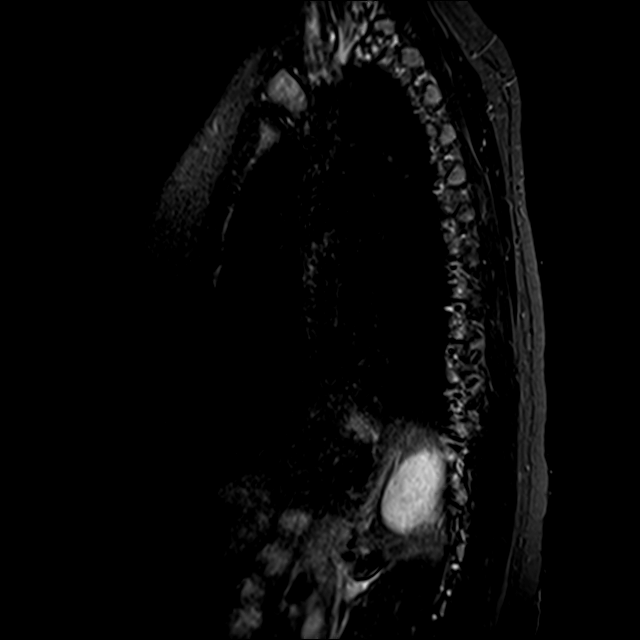
[im 4/17]
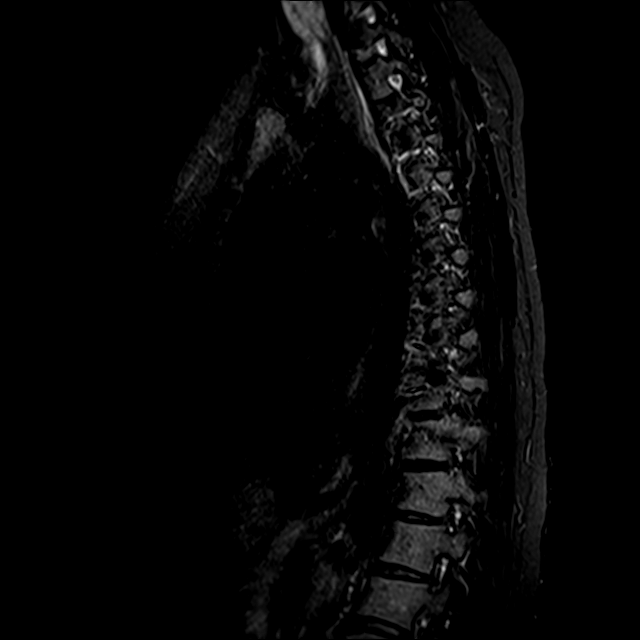
[im 7/17]
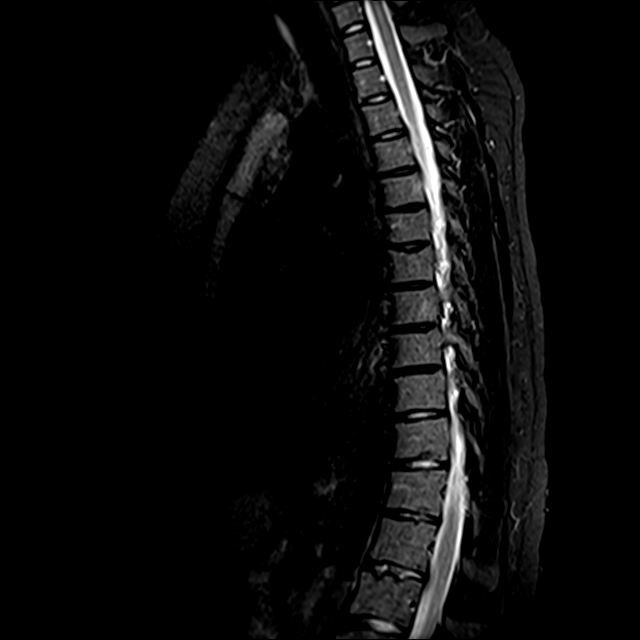
[im 10/17]
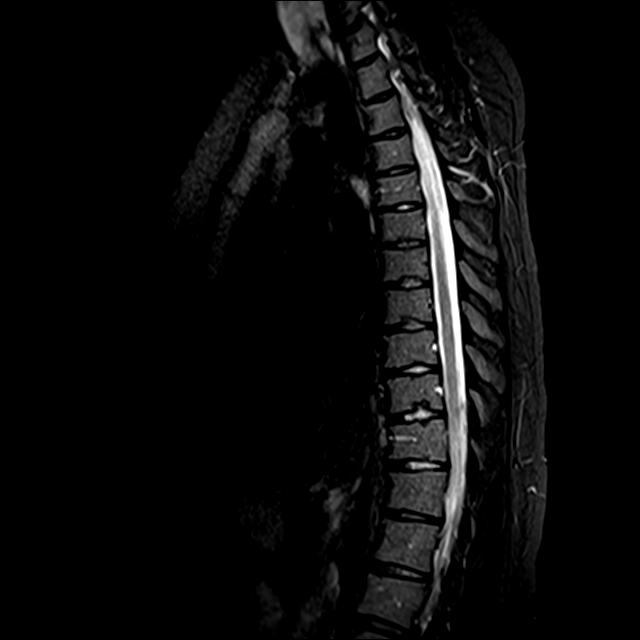
[im 13/17]
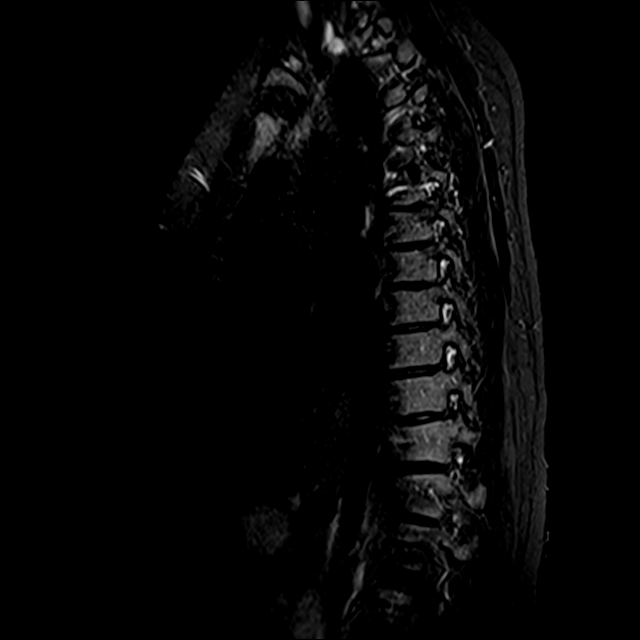
[im 17/17]
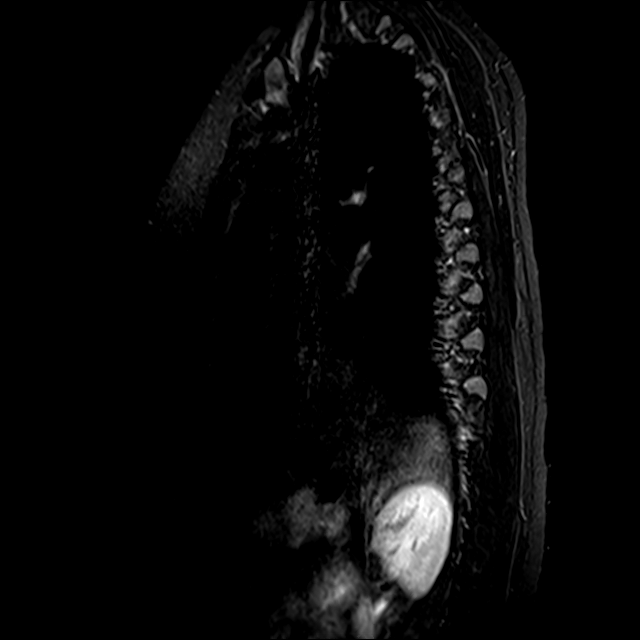

[Series 24: T2 · axial · 4.0mm · 0.59mm/px · z∈[-321,-95]mm · 8 of 39 slices shown (2 of 2)]
[im 1/39]
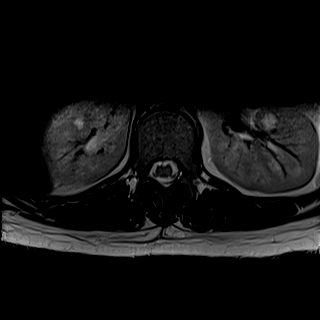
[im 6/39]
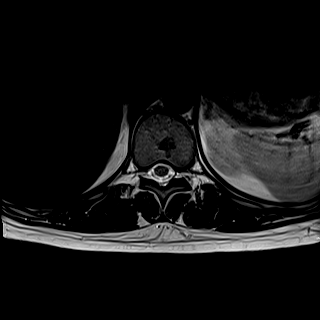
[im 12/39]
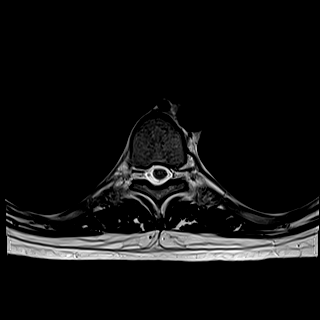
[im 18/39]
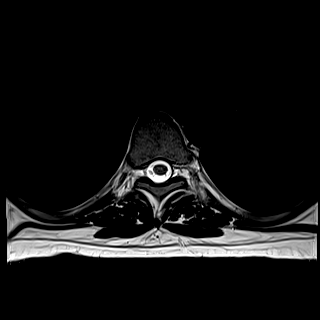
[im 21/39]
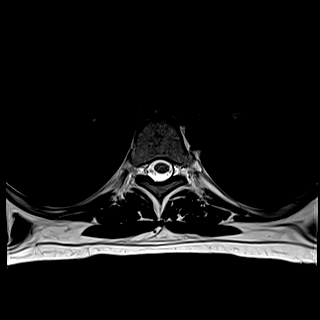
[im 27/39]
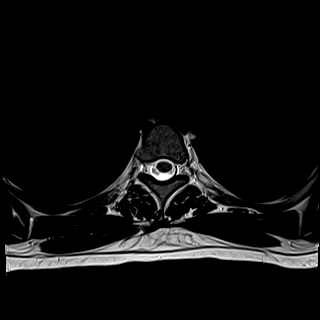
[im 33/39]
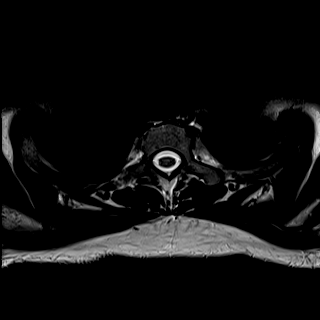
[im 39/39]
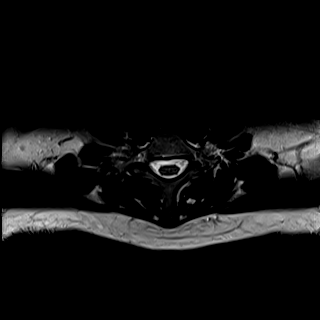

[Series 25: GRE · axial · 4.0mm · 0.37mm/px · 1 of 39 slices shown]
[im 1/39]
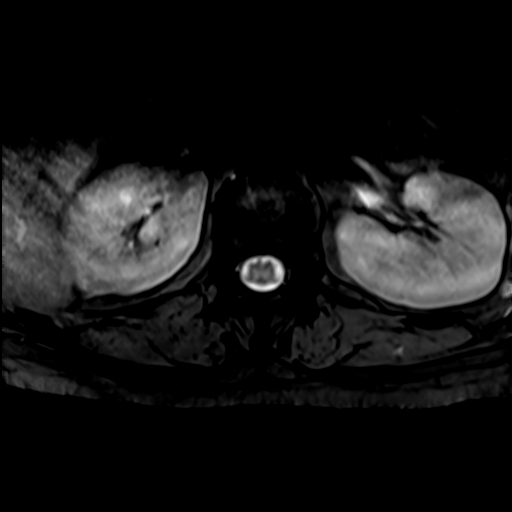

[29 of 48 positions shown; findings below may reference images not displayed]

FINDINGS: Alignment: Slight kyphotic angulation at the thoracolumbar junction.
No significant spondylolisthesis.

Vertebrae: No vertebral compression fracture within the thoracic
spine. Multilevel degenerative endplate irregularity and small
Schmorl nodes within the lower thoracic and visualized upper lumbar
spine. No significant marrow edema or focal suspicious osseous
lesion.

Cord: No signal abnormality identified within the spinal cord at the
thoracic levels.

Paraspinal and other soft tissues: Bilateral renal cysts,
incompletely assessed in the absence of intravenous contrast.
Probable small amount of hemorrhage within one of the cysts on the
right. Paraspinal soft tissues unremarkable.

Disc levels:

No more than mild disc degeneration. Shallow multilevel disc bulges.
Mild facet arthrosis and ligamentum flavum hypertrophy within the
lower thoracic spine. No significant spinal canal or foraminal
stenosis.
IMPRESSION: Mild thoracic spondylosis, as described. No significant spinal canal
or foraminal stenosis. No more than mild disc degeneration.

Slight kyphotic angulation at the thoracolumbar junction.

Bilateral renal cysts, incompletely assessed in the absence of
intravenous contrast. Probable small amount of hemorrhage within one
of the cysts on the right.

## 2021-12-14 IMAGING — MR MR CERVICAL SPINE W/O CM
5 series · 37 of 48 positions shown · non-contrast
Comparison: Report from radiographs of the cervical spine
[DATE] (images unavailable).

CLINICAL DATA: Provided history: Family history of neural
fibromatosis. Spot, JHEMBOY. Left facial numbness. Monocular
visual disturbance. Neurofibromatosis. Additional history provided:
Patient reports dizziness and. Attic migraines for 6 months, mother
has a history of neurofibromatosis. Patient also reports left facial
numbness for 2 months.

EXAM:
MRI CERVICAL SPINE WITHOUT CONTRAST
TECHNIQUE: Multiplanar, multisequence MR imaging of the cervical spine was
performed. No intravenous contrast was administered.

[Series 16: T2 · sagittal · 3.0mm · 0.62mm/px · 6 of 15 slices shown (1 of 2)]
[im 1/15]
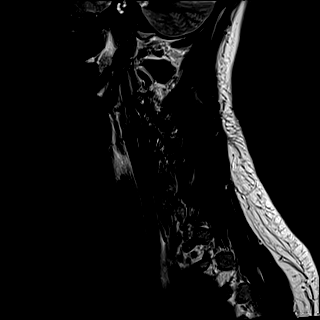
[im 3/15]
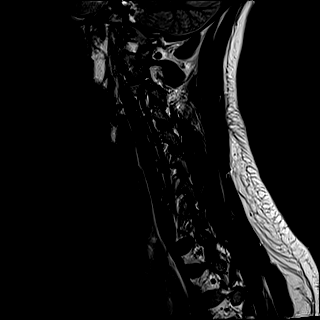
[im 6/15]
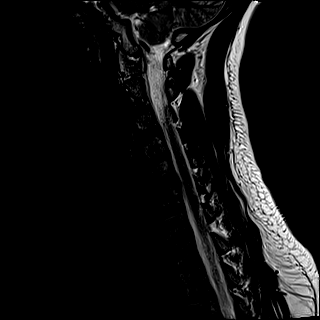
[im 9/15]
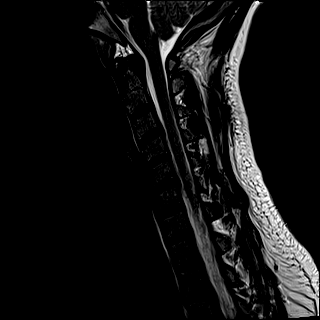
[im 12/15]
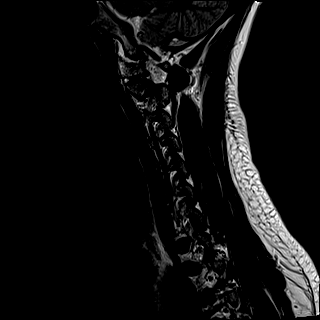
[im 15/15]
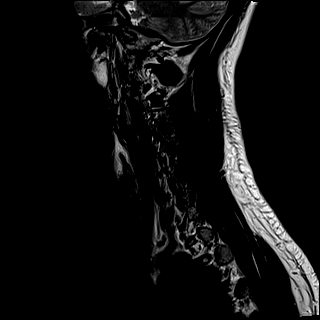

[Series 17: FLAIR · sagittal · 3.0mm · 0.78mm/px · 7 of 15 slices shown]
[im 1/15]
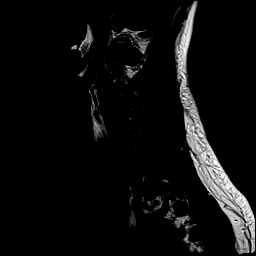
[im 3/15]
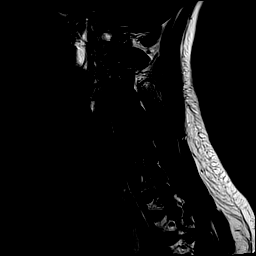
[im 5/15]
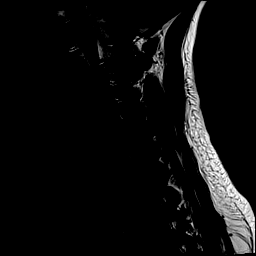
[im 8/15]
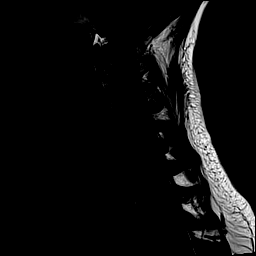
[im 10/15]
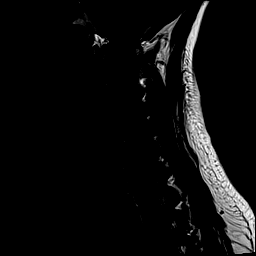
[im 12/15]
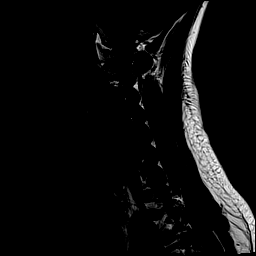
[im 15/15]
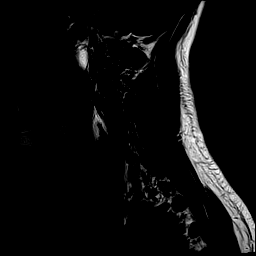

[Series 18: STIR · sagittal · 3.0mm · 0.62mm/px · 7 of 15 slices shown]
[im 1/15]
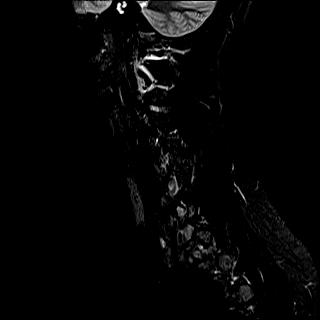
[im 3/15]
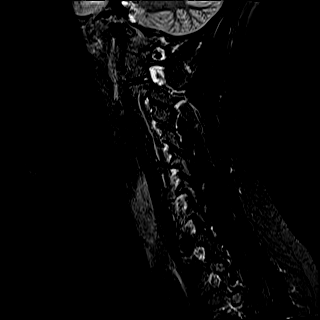
[im 5/15]
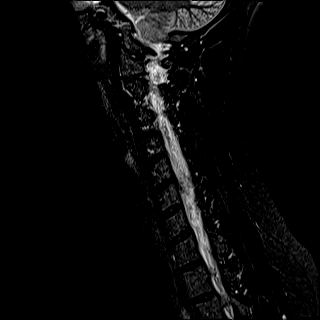
[im 8/15]
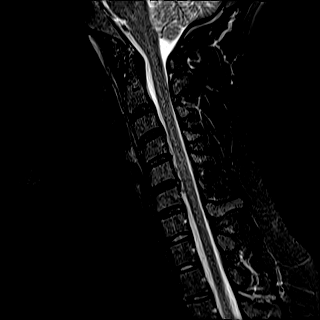
[im 10/15]
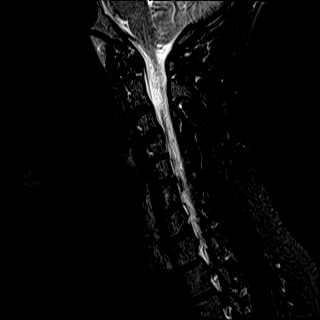
[im 12/15]
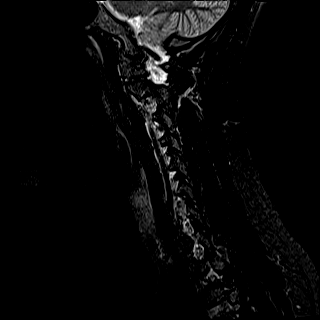
[im 15/15]
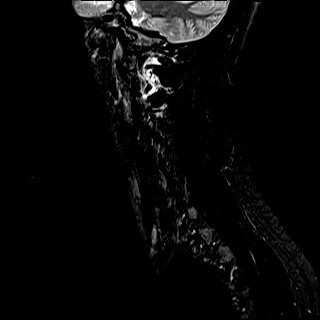

[Series 19: T2 · axial · 3.0mm · 0.70mm/px · z∈[-99,-7]mm · 9 of 29 slices shown (2 of 2)]
[im 1/29]
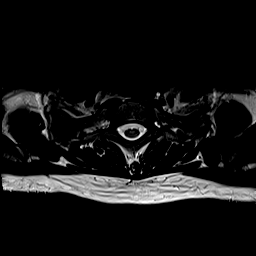
[im 3/29]
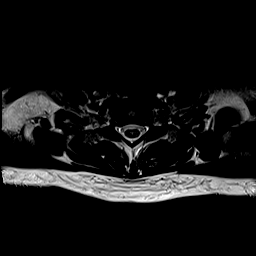
[im 5/29]
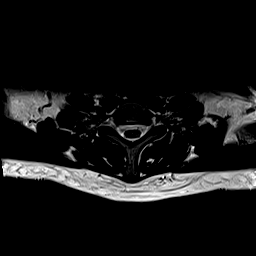
[im 9/29]
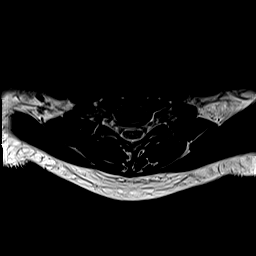
[im 13/29]
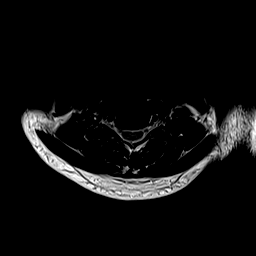
[im 16/29]
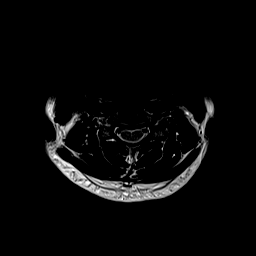
[im 20/29]
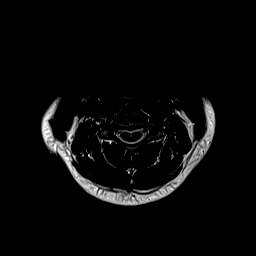
[im 24/29]
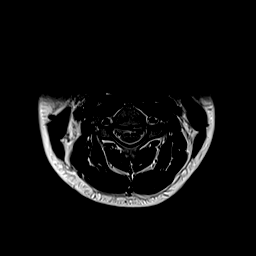
[im 29/29]
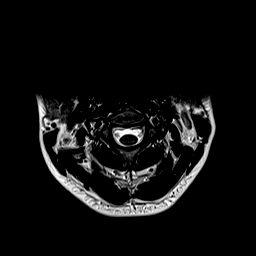

[Series 20: csp ax mpgr · axial · 3.0mm · 0.35mm/px · z∈[-99,-7]mm · 8 of 29 slices shown]
[im 1/29]
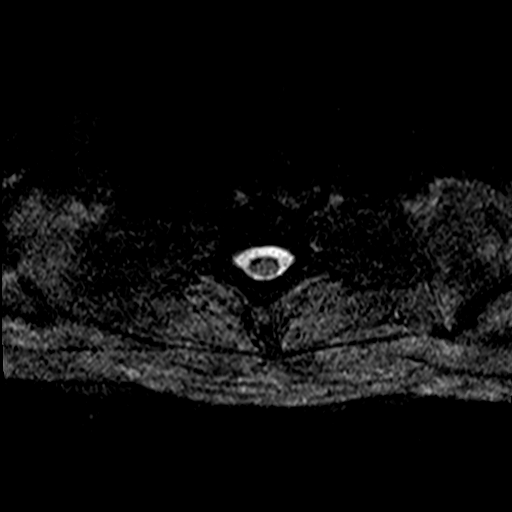
[im 5/29]
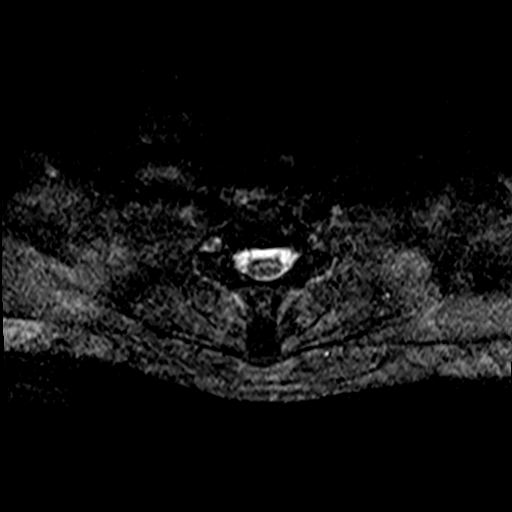
[im 9/29]
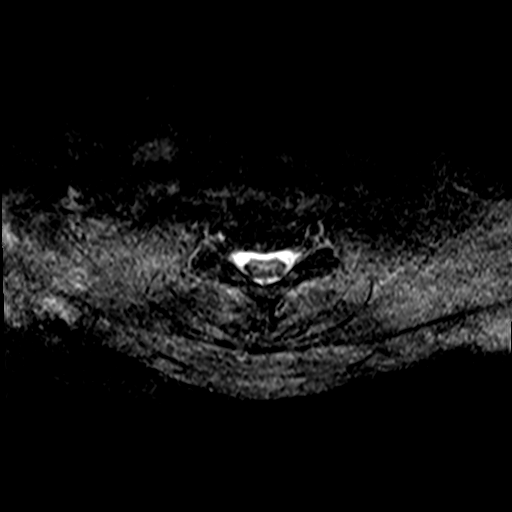
[im 13/29]
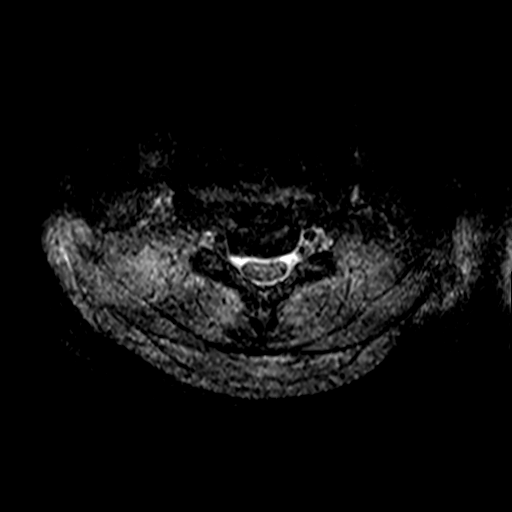
[im 16/29]
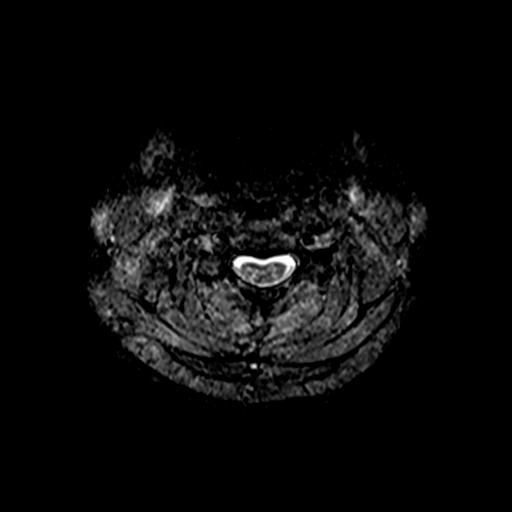
[im 20/29]
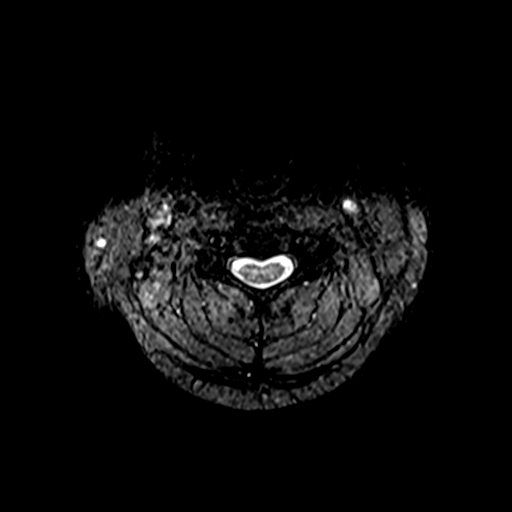
[im 24/29]
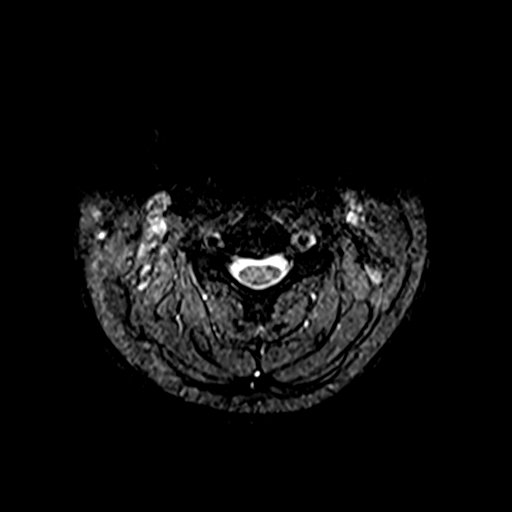
[im 29/29]
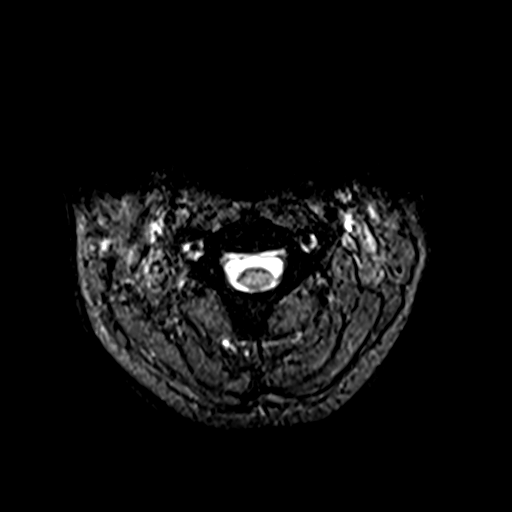

[37 of 48 positions shown; findings below may reference images not displayed]

FINDINGS: Alignment: Mild reversal of the expected cervical lordosis. No
significant spondylolisthesis.

Vertebrae: Vertebral body height is maintained. No significant
marrow edema or focal suspicious osseous lesion is identified.

Cord: No signal abnormality identified within the cervical spinal
cord.

Posterior Fossa, vertebral arteries, paraspinal tissues: Posterior
fossa better assessed on same-day brain MRI. Flow voids preserved
within the imaged cervical vertebral arteries. Paraspinal soft
tissues unremarkable.

Disc levels:

No more than mild disc degeneration within the cervical spine.

C2-C3: Slight disc bulge. No significant spinal canal or foraminal
stenosis.

C3-C4: Slight disc bulge. No significant spinal canal or foraminal
stenosis.

C4-C5: Small central disc protrusion. The disc protrusion results in
mild focal effacement of the ventral thecal sac, with possible
contact upon the ventral spinal cord. No significant foraminal
stenosis.

C5-C6: Posterior annular fissure. Disc bulge. Superimposed small
left center disc protrusion. Mild uncovertebral hypertrophy on the
right. The disc bulge and disc protrusion mildly efface the ventral
thecal sac. The disc protrusion may contact the ventral spinal cord.
No significant foraminal stenosis.

C6-C7: Tiny central disc protrusion slightly eccentric to the right.
Minimal focal effacement of the ventral thecal sac without spinal
cord mass effect). No significant foraminal stenosis.

C7-T1: No significant disc herniation or stenosis.
IMPRESSION: Cervical spondylosis, as outlined. No more than mild spinal canal
narrowing. Small disc protrusions may contact the ventral spinal
cord at C4-C5 and C5-C6. No significant foraminal stenosis. No more
than mild disc degeneration.

Mild reversal of the expected cervical lordosis.

## 2021-12-14 IMAGING — MR MR HEAD W/O CM
12 series · 46 of 48 positions shown · non-contrast
Comparison: No pertinent prior exams available for comparison.

CLINICAL DATA: Provided history: Family history of
neurofibromatosis. Spot, cafe TJISSE. Left facial numbness.
Monocular visual disturbance. Neuro fibromatosis. Additional history
provided by scanning technologist: Patient reports a family history
of neural fibromatosis, dizziness and. Attic migraines for 6 months,
recent left facial numbness.

EXAM:
MRI HEAD WITHOUT CONTRAST
TECHNIQUE: Multiplanar, multiecho pulse sequences of the brain and surrounding
structures were obtained without intravenous contrast.

[Series 5: ax dwi_tracew · axial · 3.0mm · 0.65mm/px · z∈[+15,+169]mm · 3 of 48 slices shown]
[im 1/48]
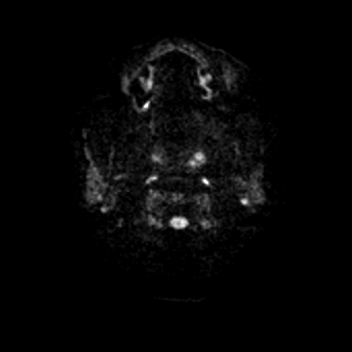
[im 24/48]
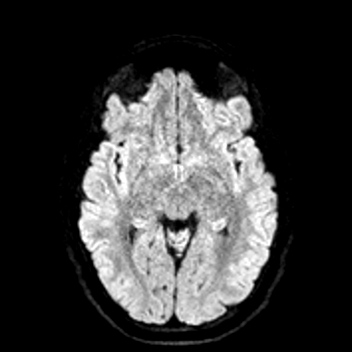
[im 48/48]
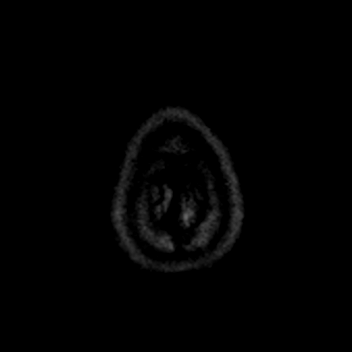

[Series 6: ax dwi_adc · axial · 3.0mm · 0.65mm/px · z∈[+15,+169]mm · 3 of 48 slices shown]
[im 1/48]
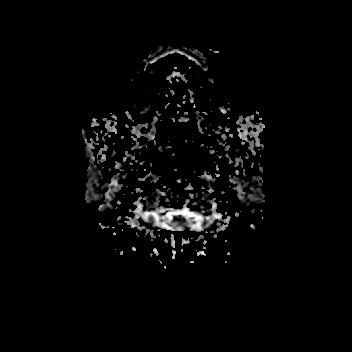
[im 24/48]
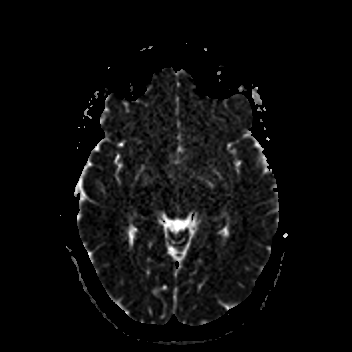
[im 48/48]
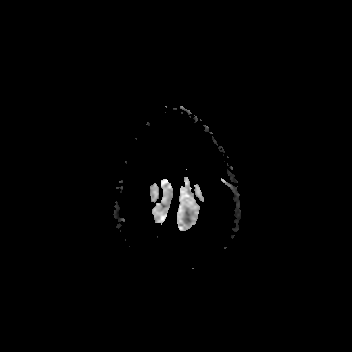

[Series 7: cor dwi_tracew · coronal · 5.0mm · 0.60mm/px · 3 of 38 slices shown]
[im 1/38]
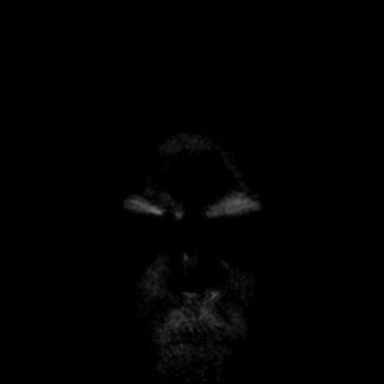
[im 19/38]
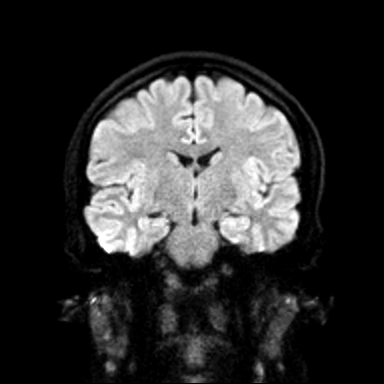
[im 38/38]
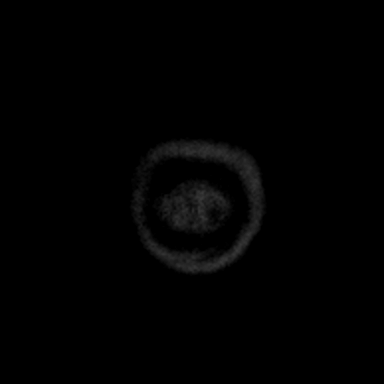

[Series 8: cor dwi_adc · coronal · 5.0mm · 0.60mm/px · 3 of 38 slices shown]
[im 1/38]
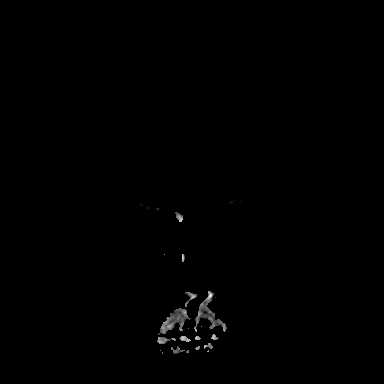
[im 19/38]
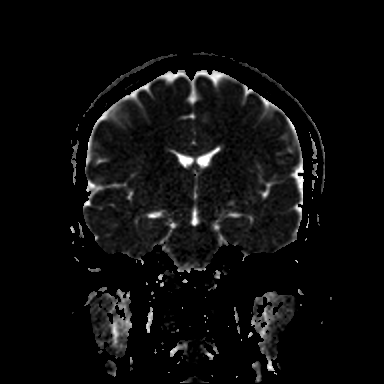
[im 38/38]
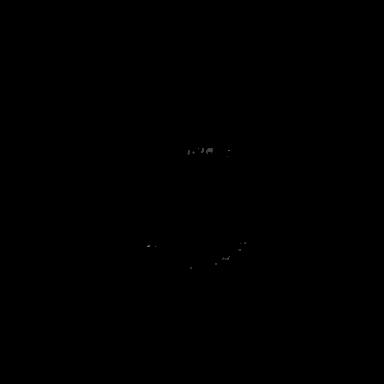

[Series 9: T1 · sagittal · 5.0mm · 0.62mm/px · 2 of 22 slices shown (1 of 2)]
[im 1/22]
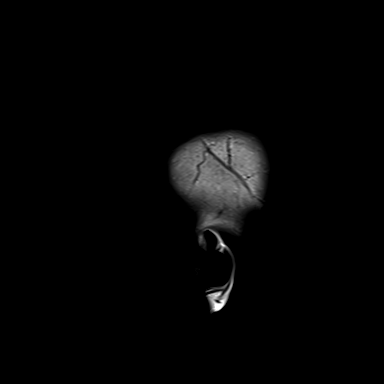
[im 22/22]
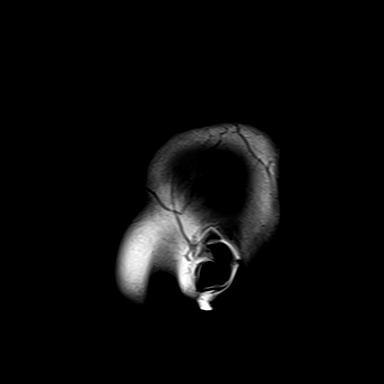

[Series 10: T2 · axial · 5.0mm · 0.53mm/px · z∈[+21,+164]mm · 2 of 25 slices shown (1 of 2)]
[im 1/25]
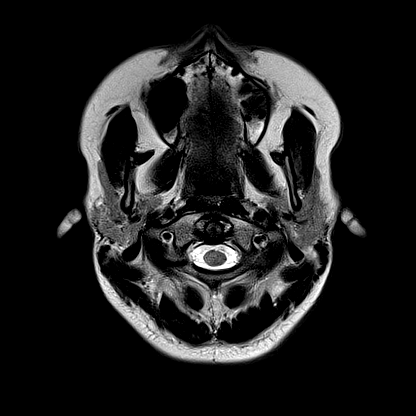
[im 25/25]
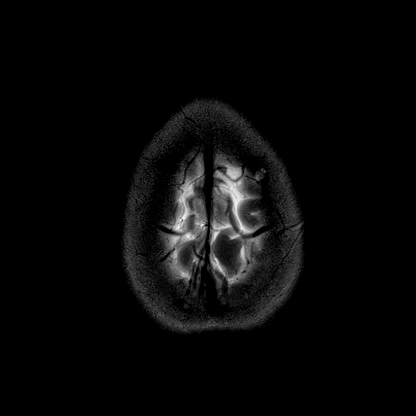

[Series 11: mag_images · axial · 3.0mm · 0.90mm/px · z∈[+4,+180]mm · 5 of 60 slices shown]
[im 1/60]
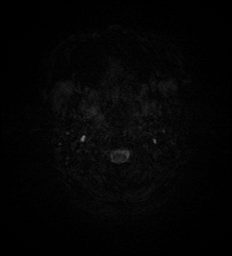
[im 15/60]
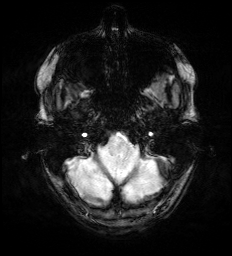
[im 30/60]
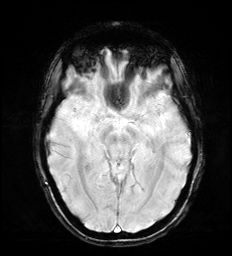
[im 45/60]
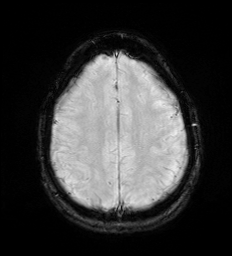
[im 60/60]
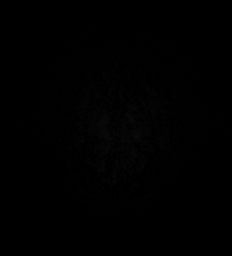

[Series 12: pha_images · axial · 3.0mm · 0.90mm/px · z∈[+4,+180]mm · 5 of 59 slices shown]
[im 1/59]
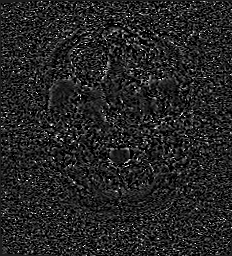
[im 15/59]
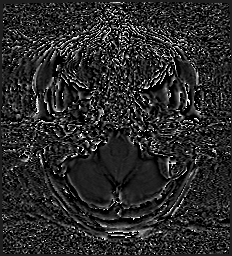
[im 30/59]
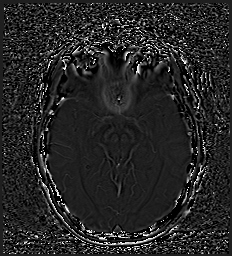
[im 44/59]
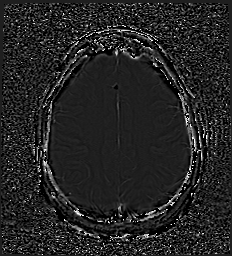
[im 59/59]
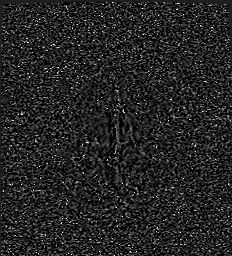

[Series 13: swi_images · axial · 3.0mm · 0.90mm/px · z∈[+4,+180]mm · 5 of 60 slices shown]
[im 1/60]
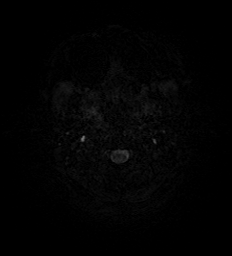
[im 15/60]
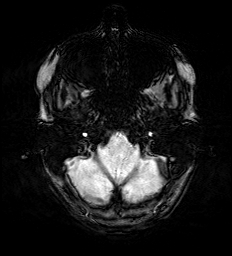
[im 30/60]
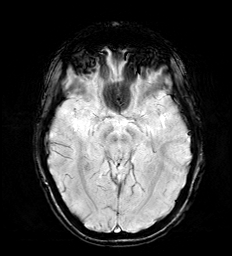
[im 45/60]
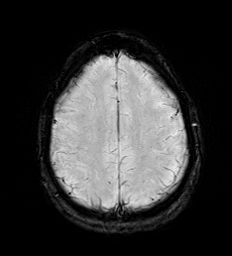
[im 60/60]
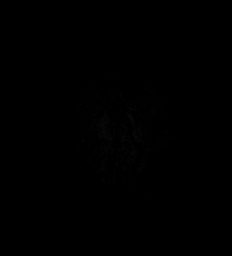

[Series 15: FLAIR · axial · 3.0mm · 0.53mm/px · z∈[+12,+173]mm · 4 of 55 slices shown]
[im 1/55]
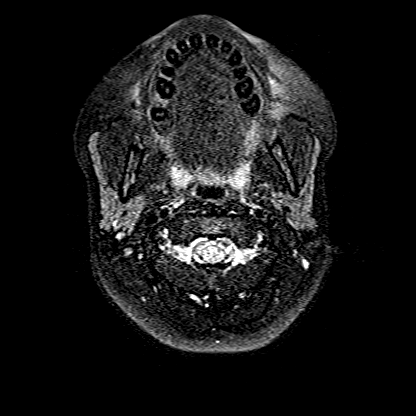
[im 19/55]
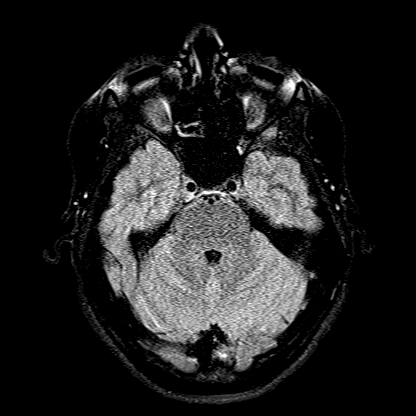
[im 37/55]
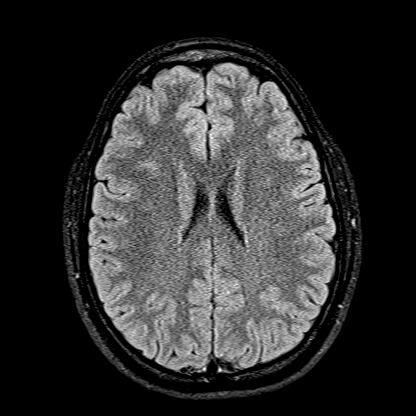
[im 55/55]
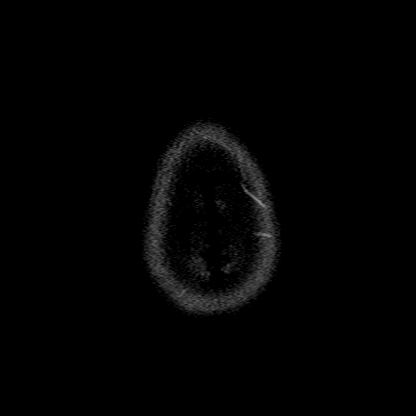

[Series 16: T1 · axial · 1.0mm · 0.98mm/px · z∈[+20,+162]mm · 9 of 144 slices shown (2 of 2)]
[im 1/144]
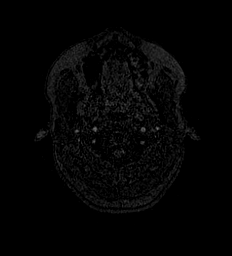
[im 15/144]
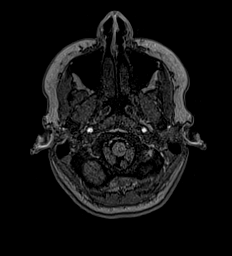
[im 29/144]
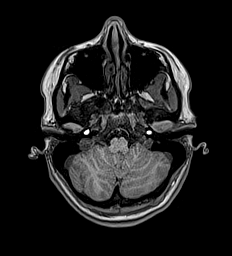
[im 43/144]
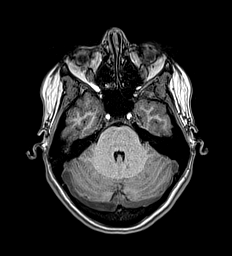
[im 58/144]
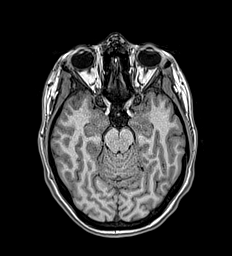
[im 86/144]
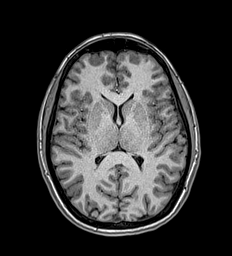
[im 101/144]
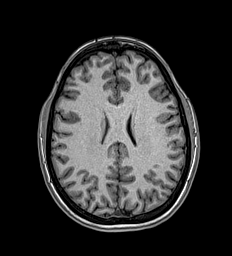
[im 115/144]
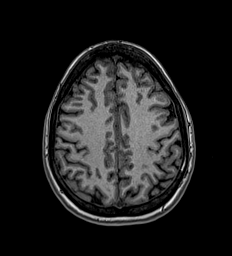
[im 144/144]
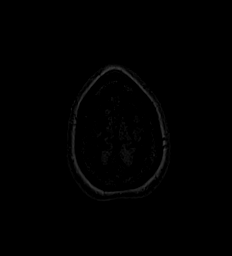

[Series 17: T2 · coronal · 5.0mm · 0.57mm/px · 2 of 29 slices shown (2 of 2)]
[im 1/29]
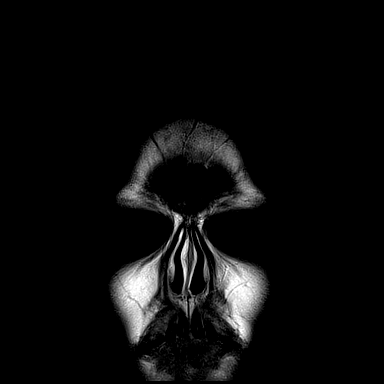
[im 29/29]
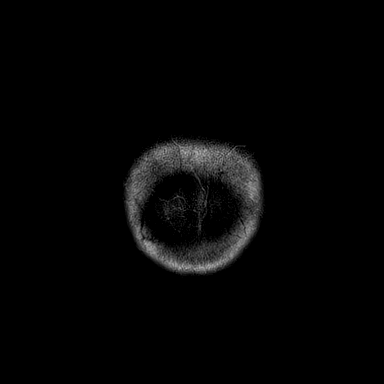

[46 of 48 positions shown; findings below may reference images not displayed]

FINDINGS: Brain:

Cerebral volume is normal.

No cortical encephalomalacia is identified. No significant cerebral
white matter disease.

There is no acute infarct.

Within limitations of a non-contrast examination, no intracranial
mass is identified.

No chronic intracranial blood products.

No extra-axial fluid collection.

No midline shift.

Vascular: Maintained flow voids within the proximal large arterial
vessels.

Skull and upper cervical spine: No focal suspicious marrow lesion.

Sinuses/Orbits: Visualized orbits show no acute finding. Mild
mucosal thickening within a posterior right ethmoid air cell.
IMPRESSION: Please note, there is somewhat limited evaluation for intracranial
neoplasms on this non-contrast examination.

Unremarkable non-contrast MRI appearance of the brain. No evidence
of acute intracranial abnormality.

Mild right ethmoid sinus mucosal thickening.

## 2021-12-14 IMAGING — MR MR LUMBAR SPINE W/O CM
5 series · 30 of 48 positions shown · non-contrast
Comparison: Same-day MRI examinations of the cervical and thoracic
spine [DATE].

CLINICAL DATA: Family history of neural fibromatosis. Spot,
MARCELLO. Left facial numbness. Monocular vision disturbance.
Neurofibromatosis. Additional history provided by scanning
technologist: Patient reports a family history of neural
fibromatosis in mother, dizziness, periodically migraines, left
facial numbness.

EXAM:
MRI LUMBAR SPINE WITHOUT CONTRAST
TECHNIQUE: Multiplanar, multisequence MR imaging of the lumbar spine was
performed. No intravenous contrast was administered.

[Series 16: T2 · sagittal · 4.0mm · 0.81mm/px · 6 of 15 slices shown (1 of 2)]
[im 1/15]
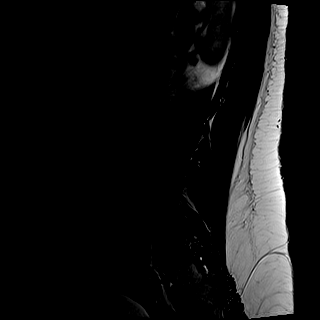
[im 3/15]
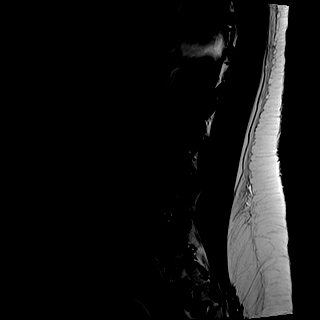
[im 6/15]
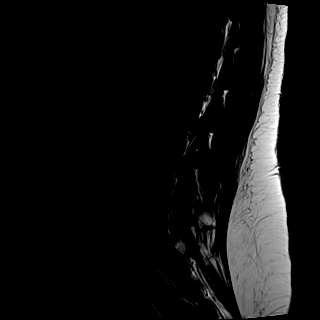
[im 9/15]
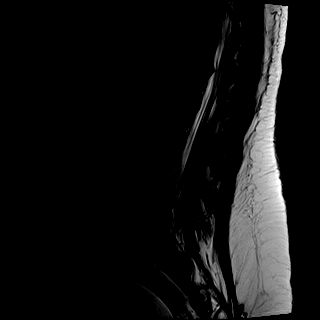
[im 12/15]
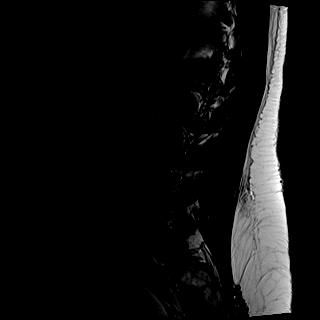
[im 15/15]
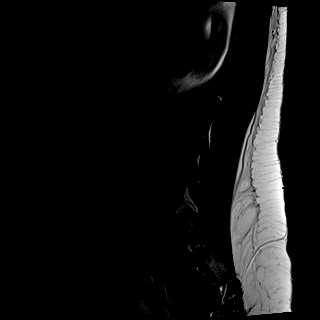

[Series 17: T1 · sagittal · 4.0mm · 0.81mm/px · 7 of 15 slices shown (1 of 2)]
[im 1/15]
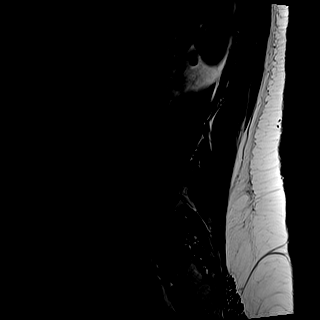
[im 3/15]
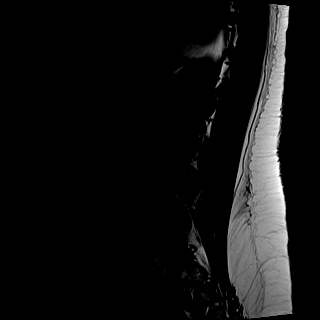
[im 5/15]
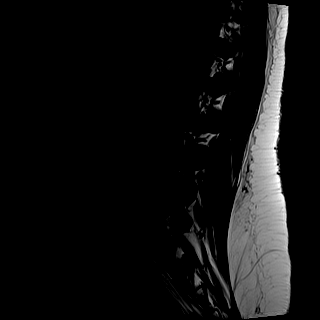
[im 8/15]
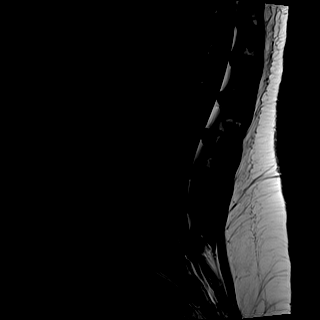
[im 10/15]
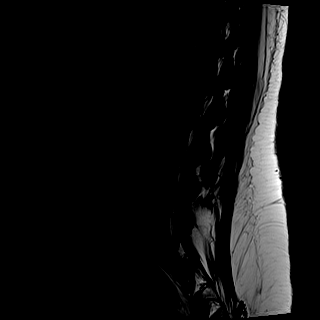
[im 12/15]
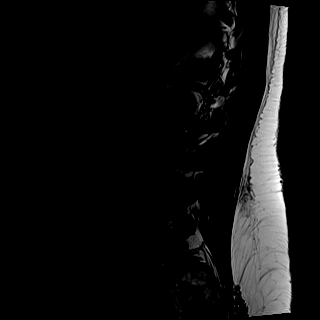
[im 15/15]
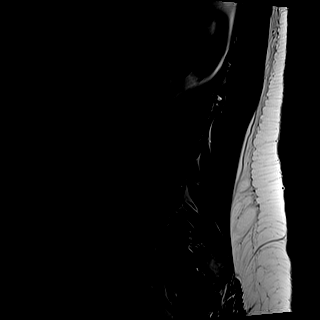

[Series 18: STIR · sagittal · 4.0mm · 0.41mm/px · 1 of 15 slices shown]
[im 1/15]
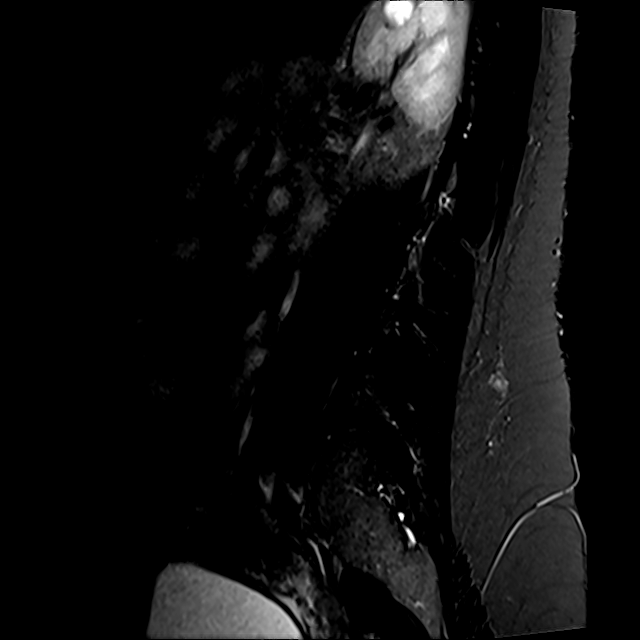

[Series 19: T2 · axial · 4.0mm · 0.78mm/px · z∈[-504,-331]mm · 8 of 29 slices shown (2 of 2)]
[im 1/29]
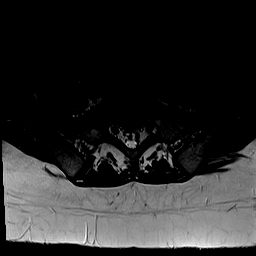
[im 5/29]
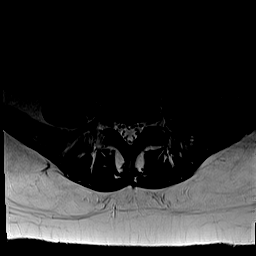
[im 9/29]
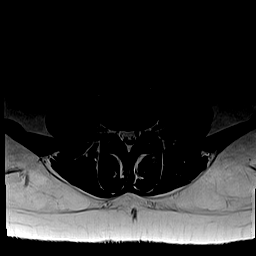
[im 13/29]
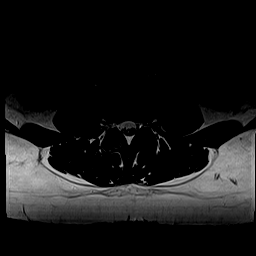
[im 16/29]
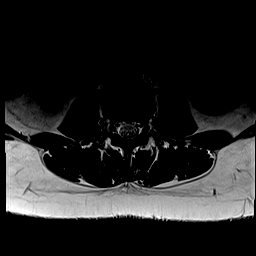
[im 20/29]
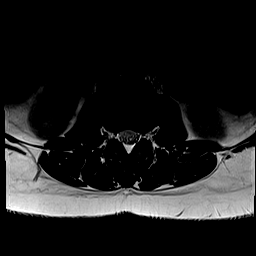
[im 24/29]
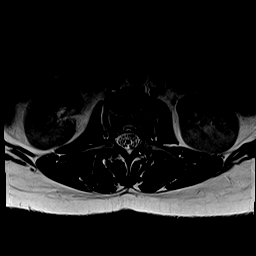
[im 29/29]
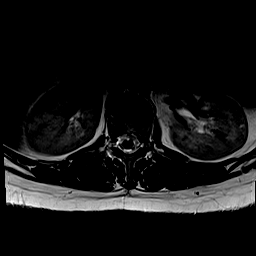

[Series 20: T1 · axial · 4.0mm · 0.39mm/px · z∈[-504,-331]mm · 8 of 29 slices shown (2 of 2)]
[im 1/29]
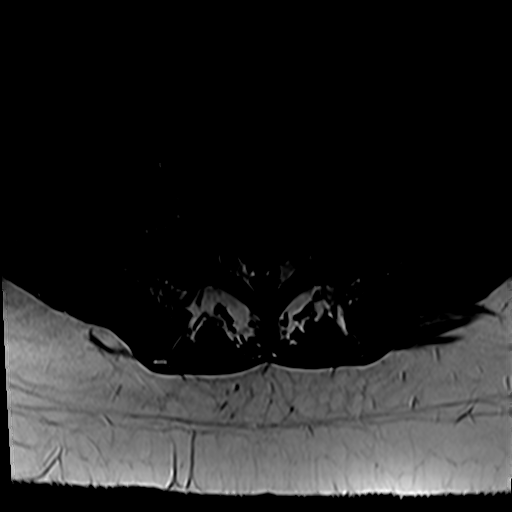
[im 5/29]
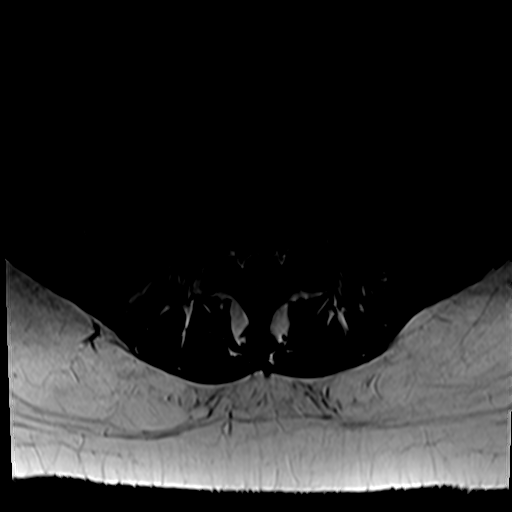
[im 9/29]
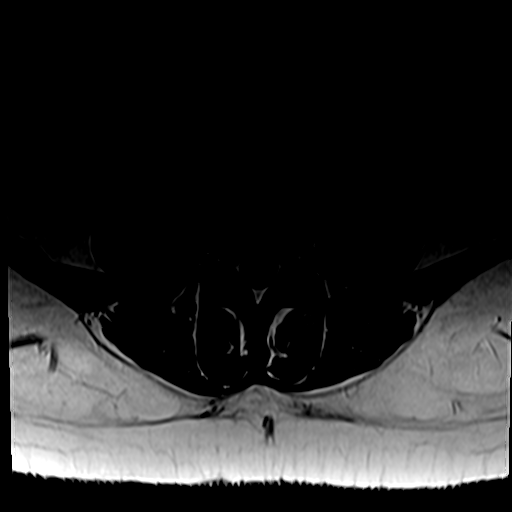
[im 13/29]
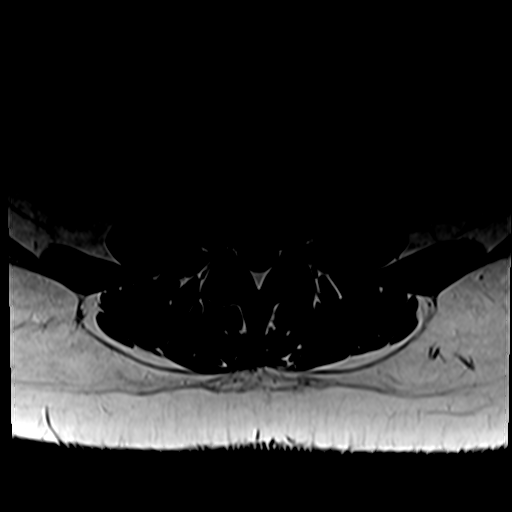
[im 16/29]
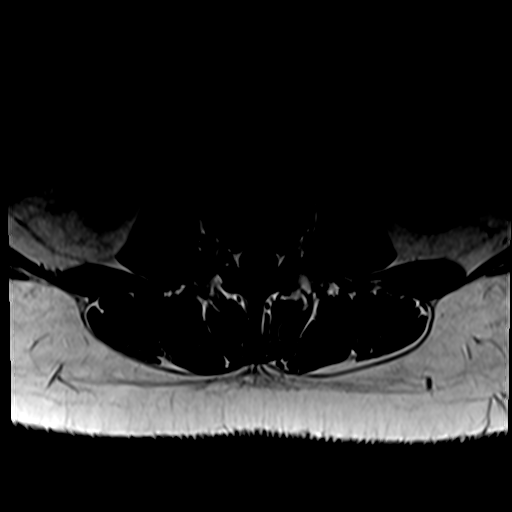
[im 20/29]
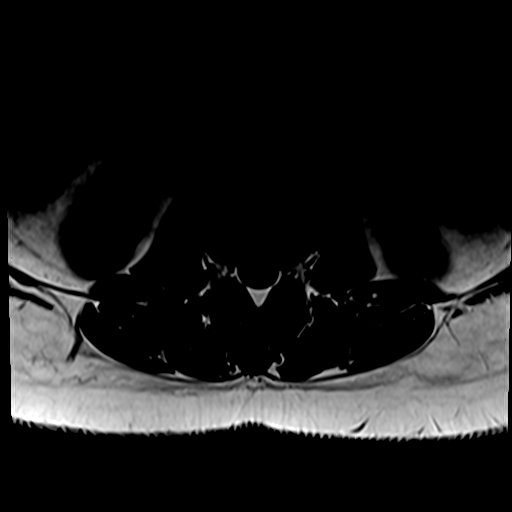
[im 24/29]
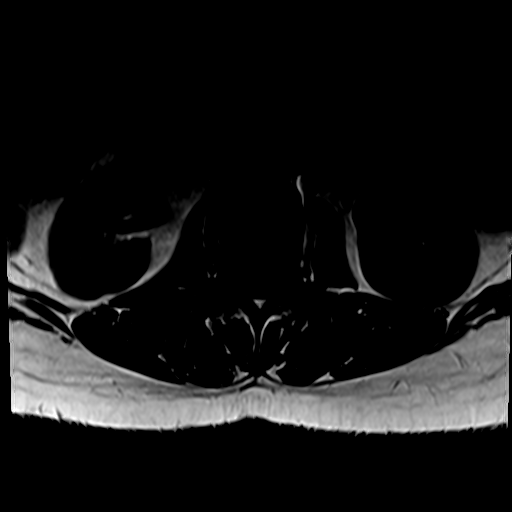
[im 29/29]
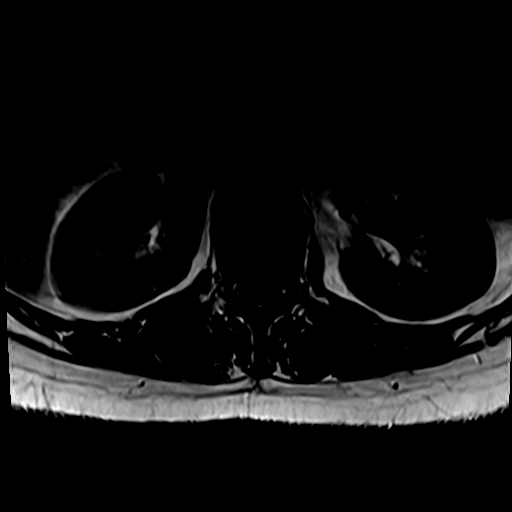

[30 of 48 positions shown; findings below may reference images not displayed]

FINDINGS: Segmentation: 5 lumbar vertebrae. The caudal most well-formed
intervertebral disc space is designated L5-S1.

Alignment:  No significant spondylolisthesis.

Vertebrae: Degenerative endplate irregularity and small Schmorl
nodes at T12-L1 and L1-L2. Mild degenerative irregularity of the L3
superior endplate. No significant marrow edema or focal suspicious
osseous lesion.

Conus medullaris and cauda equina: Conus extends to the L2 inferior
endplate level. No signal abnormality identified within the
visualized distal spinal cord.

Paraspinal and other soft tissues: Bilateral renal cysts, better
appreciated on the same-day thoracic spine MRI. Paraspinal soft
tissues unremarkable.

Disc levels:

No more than mild disc degeneration within the lumbar spine.

T12-L1: Imaged sagittally. Disc bulge. Mild partial effacement of
ventral thecal sac (without significant spinal cord mass effect). No
significant foraminal stenosis.

L1-L2: Disc bulge. Superimposed small right center disc protrusion.
The disc protrusion results in mild focal effacement of the ventral
thecal sac, and may contact the ventral aspect of the conus
medullaris. No significant foraminal stenosis.

L2-L3: Slight disc bulge. No significant spinal canal or foraminal
stenosis.

L3-L4: No significant disc herniation or stenosis.

L4-L5: Disc bulge. Superimposed small central disc protrusion. The
disc protrusion results in mild focal effacement of the ventral
thecal sac (without significant subarticular or central canal
stenosis). No significant neural foraminal narrowing.

L5-S1: Disc bulge. Superimposed small central disc protrusion at
site of posterior annular fissure. No significant spinal canal or
foraminal stenosis.
IMPRESSION: Lumbar spondylosis, as outlined. No more than mild relative spinal
canal narrowing. Most notably at L1-L2, a small right center disc
protrusion results in mild focal effacement of the ventral thecal
sac, and may contact the ventral aspect of the conus medullaris. No
significant foraminal stenosis. No more than mild disc degeneration
at the lumbar or visualized lower thoracic levels.

## 2021-12-19 MED ORDER — CYANOCOBALAMIN 1000 MCG/ML IJ SOLN: 1000.0000 ug | Freq: Once | INTRAMUSCULAR | Status: DC

## 2022-01-04 ENCOUNTER — Telehealth: Payer: Self-pay

## 2022-01-04 ENCOUNTER — Ambulatory Visit: Payer: Medicaid Other | Admitting: Nurse Practitioner

## 2022-01-04 ENCOUNTER — Encounter: Payer: Self-pay | Admitting: Nurse Practitioner

## 2022-01-04 ENCOUNTER — Other Ambulatory Visit: Payer: Self-pay

## 2022-01-04 VITALS — BP 114/90 | HR 85 | Temp 98.9°F | Resp 16 | Ht 65.0 in | Wt 192.2 lb

## 2022-01-04 DIAGNOSIS — Z8279 Family history of other congenital malformations, deformations and chromosomal abnormalities: Secondary | ICD-10-CM | POA: Diagnosis not present

## 2022-01-04 DIAGNOSIS — R200 Anesthesia of skin: Secondary | ICD-10-CM

## 2022-01-04 DIAGNOSIS — R3589 Other polyuria: Secondary | ICD-10-CM

## 2022-01-04 DIAGNOSIS — L813 Cafe au lait spots: Secondary | ICD-10-CM | POA: Diagnosis not present

## 2022-01-04 DIAGNOSIS — K219 Gastro-esophageal reflux disease without esophagitis: Secondary | ICD-10-CM

## 2022-01-04 DIAGNOSIS — R110 Nausea: Secondary | ICD-10-CM

## 2022-01-04 DIAGNOSIS — E6609 Other obesity due to excess calories: Secondary | ICD-10-CM

## 2022-01-04 DIAGNOSIS — E66811 Other obesity due to excess calories: Secondary | ICD-10-CM

## 2022-01-04 DIAGNOSIS — Z6831 Body mass index (BMI) 31.0-31.9, adult: Secondary | ICD-10-CM

## 2022-01-04 LAB — POCT GLYCOSYLATED HEMOGLOBIN (HGB A1C): Hemoglobin A1C: 4.9 % (ref 4.0–5.6)

## 2022-01-04 MED ORDER — PROMETHAZINE HCL 25 MG PO TABS: 25.0000 mg | ORAL_TABLET | Freq: Three times a day (TID) | ORAL | 2 refills | Status: DC | PRN

## 2022-01-04 MED ORDER — PANTOPRAZOLE SODIUM 40 MG PO TBEC: 40.0000 mg | DELAYED_RELEASE_TABLET | Freq: Every day | ORAL | 2 refills | Status: DC

## 2022-01-04 MED ORDER — ONDANSETRON HCL 4 MG PO TABS: 4.0000 mg | ORAL_TABLET | Freq: Three times a day (TID) | ORAL | 2 refills | Status: DC | PRN

## 2022-01-04 NOTE — Progress Notes (Signed)
[facility] [address], [place] [address]  Internal MEDICINE  Office Visit Note  Patient Name: Kathryn Estrada  [id number]  [id number]  Date of Service: 01/04/2022  Chief Complaint  Patient presents with   Follow-up   Results    HPI Kathryn Estrada presents for a follow up visit to discuss MRI results. There is no definitive findings suggestive of Neurofibromatosis but the likelihood of the patient having neurofibromatosis based on symptoms as well as prominent family history is high. A referral to neurology was previously discussed and was addressed again at today's office visit.  Kathryn Estrada also reports having significant nausea almost every day. Kathryn Estrada denies vomiting, abdominal pain, diarrhea or constipation. Kathryn Estrada has other family members who have GERD and Kathryn Estrada had GERD while Kathryn Estrada was pregnant. Kathryn Estrada denies any heartburn or indigestion, just nausea.    Kathryn Estrada is also worried that Kathryn Estrada has diabetes. Kathryn Estrada reports polydipsia, polyuria, and polyphagia. Kathryn Estrada has also had headaches, episodes of dizziness and clamminess. Her A1C was checked today and remains normal at 4.9 with no change from the last time it was checked. There are other possible causes of polyuria so will discuss labs to order.       Current Medication: Outpatient Encounter Medications as of 01/04/2022  Medication Sig   meclizine (ANTIVERT) 25 MG tablet Take 1 tablet (25 mg total) by mouth 3 (three) times daily as needed for dizziness.   Naproxen Sodium (ALEVE PO) Take by mouth.   ondansetron (ZOFRAN) 4 MG tablet Take 1 tablet (4 mg total) by mouth every 8 (eight) hours as needed for nausea or vomiting.   pantoprazole (PROTONIX) 40 MG tablet Take 1 tablet (40 mg total) by mouth daily.   promethazine (PHENERGAN) 25 MG tablet Take 1 tablet (25 mg total) by mouth every 8 (eight) hours as needed for nausea or vomiting.   Facility-Administered Encounter Medications as of 01/04/2022  Medication   cyanocobalamin ((VITAMIN B-12))  injection 1,000 mcg    Surgical History: Past Surgical History:  Procedure Laterality Date   CESAREAN SECTION N/A 04/03/2021   Procedure: CESAREAN SECTION;  Surgeon: Homero Fellers, MD;  Location: [facility] ORS;  Service: Obstetrics;  Laterality: N/A;    Medical History: Past Medical History:  Diagnosis Date   Complication of anesthesia    needs a good amount of anesthesia to get numb   Raynaud's syndrome without gangrene     Family History: Family History  Problem Relation Age of Onset   Neurofibromatosis Mother    Anemia Mother    Diabetes Other     Social History   Socioeconomic History   Marital status: Married    Spouse name: Dorothea Ogle   Number of children: Not on file   Years of education: Not on file   Highest education level: Not on file  Occupational History   Not on file  Tobacco Use   Smoking status: Every Day    Types: Cigarettes, E-cigarettes    Last attempt to quit: 08/28/2020    Years since quitting: 1.3   Smokeless tobacco: Never   Tobacco comments:    Quit cigarettes 08-28-2020, now vapes   Vaping Use   Vaping Use: Every day   Last attempt to quit: 08/28/2020   Devices: vape   Substance and Sexual Activity   Alcohol use: Not Currently    Comment: ocassinally/ once a month    Drug use: Never   Sexual activity: Yes    Birth control/protection: None    Comment:  undecided  Other Topics Concern   Not on file  Social History Narrative   Not on file   Social Determinants of Health   Financial Resource Strain: Not on file  Food Insecurity: Not on file  Transportation Needs: Not on file  Physical Activity: Not on file  Stress: Not on file  Social Connections: Not on file  Intimate Partner Violence: Not on file     Review of Systems  Constitutional:  Positive for appetite change (increased, cannot lose weight), fatigue and unexpected weight change.  Eyes:  Positive for visual disturbance.  Respiratory:  Negative for cough, chest tightness and  wheezing.   Cardiovascular: Negative.  Negative for chest pain and palpitations.  Gastrointestinal:  Positive for nausea. Negative for abdominal pain, constipation, diarrhea and vomiting.  Endocrine: Positive for polydipsia, polyphagia and polyuria.  Genitourinary:  Positive for frequency.  Musculoskeletal:  Positive for arthralgias.  Neurological:  Positive for dizziness and headaches. Negative for light-headedness.  Psychiatric/Behavioral: Negative.      Vital Signs: BP 114/90    Pulse 85    Temp 98.9 F (37.2 C)    Resp 16    Ht _0  (1.651 m)    Wt 192 lb 3.2 oz (87.2 kg)    LMP 12/10/2021    SpO2 99%    BMI 31.98 kg/m      Physical Exam Vitals reviewed.  Constitutional:      General: Kathryn Estrada is not in acute distress.    Appearance: Normal appearance. Kathryn Estrada is obese. Kathryn Estrada is not ill-appearing.  HENT:     Head: Normocephalic and atraumatic.  Eyes:     Pupils: Pupils are equal, round, and reactive to light.  Cardiovascular:     Rate and Rhythm: Normal rate and regular rhythm.  Pulmonary:     Effort: Pulmonary effort is normal. No respiratory distress.  Neurological:     Mental Status: Kathryn Estrada is alert and oriented to person, place, and time.     Cranial Nerves: No cranial nerve deficit.     Coordination: Coordination normal.     Gait: Gait normal.  Psychiatric:        Mood and Affect: Mood normal.        Behavior: Behavior normal.      Assessment/Plan: 1. Left facial numbness Discussed MRI of brain and spine. Due to signs, symptoms and significant family history, willrefer to neurology for further evaluation for neurofibromatosis and further review of MRIs.  - Ambulatory referral to Neurology   2. Spot, cafe-au-lait Please see problem #1 - Ambulatory referral to Neurology   3. Family history of neurofibromatosis Please see problem #1 - Ambulatory referral to Neurology   4. Gastroesophageal reflux disease without esophagitis Possible GERD with severe nausea. Kathryn Estrada has had  GERD in the past, no heart burn or indigestion. Patient instructed to start pantoprazole and see if her nausea improves prior to trying zofran or promethazine. If Kathryn Estrada continues to have nausea that is unrelieved or recurs, will consider referral to gastroenterology.  - ondansetron (ZOFRAN) 4 MG tablet; Take 1 tablet (4 mg total) by mouth every 8 (eight) hours as needed for nausea or vomiting.  Dispense: 20 tablet; Refill: 2 - promethazine (PHENERGAN) 25 MG tablet; Take 1 tablet (25 mg total) by mouth every 8 (eight) hours as needed for nausea or vomiting.  Dispense: 20 tablet; Refill: 2 - pantoprazole (PROTONIX) 40 MG tablet; Take 1 tablet (40 mg total) by mouth daily.  Dispense: 30 tablet; Refill: 2  5. Nausea without vomiting Zofran and promethazine prescribed for nausea if no relief with pantoprazole. Zofran will be for mild nausea and promethazine will be used for more severe nausea, patient is aware and agreeable to plan.  - ondansetron (ZOFRAN) 4 MG tablet; Take 1 tablet (4 mg total) by mouth every 8 (eight) hours as needed for nausea or vomiting.  Dispense: 20 tablet; Refill: 2 - promethazine (PHENERGAN) 25 MG tablet; Take 1 tablet (25 mg total) by mouth every 8 (eight) hours as needed for nausea or vomiting.  Dispense: 20 tablet; Refill: 2   6. Polyuria A1C is normal. CMP ordered to assess kidney and liver function.  - POCT glycosylated hemoglobin (Hb A1C) - CMP14+EGFR   7. Class 1 obesity due to excess calories without serious comorbidity with body mass index (BMI) of 31.0 to 31.9 in adult Patient states that Kathryn Estrada cannot lose weight regardless of what Kathryn Estrada eats or what exercise Kathryn Estrada does. Plan to follow up in 1 month to discuss weight loss management and perform a metabolic test in office. Patient is asking to try phentermine to aid in weight loss, will discuss further at next office visit.      General Counseling: Kathryn Estrada understanding of the findings of todays visit and agrees  with plan of treatment. I have discussed any further diagnostic evaluation that may be needed or ordered today. We also reviewed her medications today. Kathryn Estrada has been encouraged to call the office with any questions or concerns that should arise related to todays visit.    Orders Placed This Encounter  Procedures   CMP14+EGFR   Ambulatory referral to Neurology   POCT glycosylated hemoglobin (Hb A1C)    Meds ordered this encounter  Medications   ondansetron (ZOFRAN) 4 MG tablet    Sig: Take 1 tablet (4 mg total) by mouth every 8 (eight) hours as needed for nausea or vomiting.    Dispense:  20 tablet    Refill:  2   promethazine (PHENERGAN) 25 MG tablet    Sig: Take 1 tablet (25 mg total) by mouth every 8 (eight) hours as needed for nausea or vomiting.    Dispense:  20 tablet    Refill:  2   pantoprazole (PROTONIX) 40 MG tablet    Sig: Take 1 tablet (40 mg total) by mouth daily.    Dispense:  30 tablet    Refill:  2    Return in about 1 month (around 02/04/2022) for F/U, Weight loss, Nica Friske PCP do metabolic test at next office visit. .   Total time spent:30 Minutes Time spent includes review of chart, medications, test results, and follow up plan with the patient.   [place] Controlled Substance Database was reviewed by me.  This patient was seen by Jonetta Osgood, FNP-C in collaboration with Dr. Clayborn Bigness as a part of collaborative care agreement.   Kathryn Soucy R. Valetta Fuller, MSN, FNP-C Internal medicine

## 2022-01-04 NOTE — Telephone Encounter (Signed)
Neurology referral sent via [facility] to [facility] 

## 2022-01-21 ENCOUNTER — Encounter: Payer: Self-pay | Admitting: Nurse Practitioner

## 2022-01-21 ENCOUNTER — Ambulatory Visit: Payer: Medicaid Other | Admitting: Nurse Practitioner

## 2022-01-21 ENCOUNTER — Other Ambulatory Visit: Payer: Self-pay

## 2022-01-21 VITALS — BP 110/78 | HR 91 | Temp 98.7°F | Resp 16 | Ht 65.0 in | Wt 195.4 lb

## 2022-01-21 DIAGNOSIS — H60392 Other infective otitis externa, left ear: Secondary | ICD-10-CM | POA: Diagnosis not present

## 2022-01-21 MED ORDER — NEOMYCIN-POLYMYXIN-HC 1 % OT SOLN: 3.0000 [drp] | Freq: Four times a day (QID) | OTIC | 0 refills | Status: AC

## 2022-01-21 NOTE — Progress Notes (Signed)
[facility] [place], [place] [address]  Internal MEDICINE  Office Visit Note  Patient Name: Kathryn Estrada  [id number]  [id number]  Date of Service: 01/21/2022  Chief Complaint  Patient presents with   Acute Visit   Ear Pain    Left ear, earring was missing the same day and wants to make sure that is not the cause, no other sinus issues or drainage      HPI Kathryn Estrada presents for an acute sick visit for left ear pain, earring was missing the same day and wants to make sure that is not the cause, no other sinus issues or drainage.    Current Medication:  Outpatient Encounter Medications as of 01/21/2022  Medication Sig   meclizine (ANTIVERT) 25 MG tablet Take 1 tablet (25 mg total) by mouth 3 (three) times daily as needed for dizziness.   Naproxen Sodium (ALEVE PO) Take by mouth.   NEOMYCIN-POLYMYXIN-HYDROCORTISONE (CORTISPORIN) 1 % SOLN OTIC solution Place 3 drops into the left ear 4 (four) times daily for 5 days.   ondansetron (ZOFRAN) 4 MG tablet Take 1 tablet (4 mg total) by mouth every 8 (eight) hours as needed for nausea or vomiting.   pantoprazole (PROTONIX) 40 MG tablet Take 1 tablet (40 mg total) by mouth daily.   promethazine (PHENERGAN) 25 MG tablet Take 1 tablet (25 mg total) by mouth every 8 (eight) hours as needed for nausea or vomiting.   Facility-Administered Encounter Medications as of 01/21/2022  Medication   cyanocobalamin ((VITAMIN B-12)) injection 1,000 mcg      Medical History: Past Medical History:  Diagnosis Date   Complication of anesthesia    needs a good amount of anesthesia to get numb   Raynaud's syndrome without gangrene      Vital Signs: BP 110/78    Pulse 91    Temp 98.7 F (37.1 C)    Resp 16    Ht 5\' 5"  (1.651 m)    Wt 195 lb 6.4 oz (88.6 kg)    SpO2 99%    BMI 32.52 kg/m    Review of Systems  Constitutional:  Negative for chills, fatigue and unexpected weight change.  HENT:  Positive for ear pain. Negative  for congestion, ear discharge, hearing loss, postnasal drip, rhinorrhea, sneezing and sore throat.   Eyes:  Negative for redness.  Respiratory:  Negative for cough, chest tightness and shortness of breath.   Cardiovascular:  Negative for chest pain and palpitations.  Gastrointestinal:  Negative for abdominal pain, constipation, diarrhea, nausea and vomiting.  Genitourinary:  Negative for dysuria and frequency.  Musculoskeletal:  Negative for arthralgias, back pain, joint swelling and neck pain.  Skin:  Negative for rash.  Neurological: Negative.  Negative for tremors and numbness.  Hematological:  Negative for adenopathy. Does not bruise/bleed easily.  Psychiatric/Behavioral:  Negative for behavioral problems (Depression), sleep disturbance and suicidal ideas. The patient is not nervous/anxious.    Physical Exam Vitals reviewed.  Constitutional:      General: She is not in acute distress.    Appearance: Normal appearance. She is well-developed and well-groomed. She is obese. She is not ill-appearing.  HENT:     Head: Normocephalic and atraumatic.     Right Ear: Hearing, tympanic membrane, ear canal and external ear normal. No foreign body.     Left Ear: Hearing and tympanic membrane normal. No decreased hearing noted. Swelling and tenderness present. No drainage. No foreign body.     Mouth/Throat:  Lips: Pink.     Pharynx: Oropharynx is clear. Uvula midline.  Eyes:     General: Lids are normal. Vision grossly intact. Gaze aligned appropriately.     Pupils: Pupils are equal, round, and reactive to light.     Funduscopic exam:    Right eye: Red reflex present.        Left eye: Red reflex present. Neck:     Trachea: Trachea and phonation normal.  Cardiovascular:     Rate and Rhythm: Normal rate and regular rhythm.  Pulmonary:     Effort: Pulmonary effort is normal. No respiratory distress.  Lymphadenopathy:     Cervical: No cervical adenopathy.  Neurological:     Mental Status:  She is alert.  Psychiatric:        Behavior: Behavior is cooperative.      Assessment/Plan: 1. Other infective acute otitis externa of left ear Antibiotic ear drops prescribed.  - NEOMYCIN-POLYMYXIN-HYDROCORTISONE (CORTISPORIN) 1 % SOLN OTIC solution; Place 3 drops into the left ear 4 (four) times daily for 5 days.  Dispense: 10 mL; Refill: 0   General Counseling: Kathryn Estrada verbalizes understanding of the findings of todays visit and agrees with plan of treatment. I have discussed any further diagnostic evaluation that may be needed or ordered today. We also reviewed her medications today. she has been encouraged to call the office with any questions or concerns that should arise related to todays visit.    Counseling:    No orders of the defined types were placed in this encounter.   Meds ordered this encounter  Medications   NEOMYCIN-POLYMYXIN-HYDROCORTISONE (CORTISPORIN) 1 % SOLN OTIC solution    Sig: Place 3 drops into the left ear 4 (four) times daily for 5 days.    Dispense:  10 mL    Refill:  0    Return if symptoms worsen or fail to improve.  [place] Controlled Substance Database was reviewed by me for overdose risk score (ORS)  Time spent:15 Minutes Time spent with patient included reviewing progress notes, labs, imaging studies, and discussing plan for follow up.   This patient was seen by Jonetta Osgood, FNP-C in collaboration with Dr. Clayborn Bigness as a part of collaborative care agreement.  Amine Adelson R. Valetta Fuller, MSN, FNP-C Internal Medicine

## 2022-01-23 NOTE — Telephone Encounter (Signed)
Per Deborah Chalk, they lvm 01/15/22 to schedule appointment. Patient has not returned call-Toni

## 2022-03-04 ENCOUNTER — Ambulatory Visit: Payer: Medicaid Other | Admitting: Nurse Practitioner

## 2022-03-04 DIAGNOSIS — Z0289 Encounter for other administrative examinations: Secondary | ICD-10-CM

## 2022-03-05 ENCOUNTER — Telehealth: Payer: Self-pay

## 2022-03-05 NOTE — Telephone Encounter (Signed)
Left voicemail for patient to return call and reschedule missed office visit from 03-04-22. ?

## 2022-03-15 ENCOUNTER — Telehealth: Payer: Self-pay

## 2022-03-15 NOTE — Telephone Encounter (Signed)
Per Mistie w/ [facility] neurology, patient declined to schedule appointment-Toni ?

## 2022-05-23 ENCOUNTER — Encounter: Payer: Self-pay | Admitting: Nurse Practitioner

## 2022-05-23 ENCOUNTER — Ambulatory Visit: Payer: Medicaid Other | Admitting: Nurse Practitioner

## 2022-05-23 VITALS — BP 129/90 | HR 99 | Temp 98.3°F | Resp 16 | Ht 64.0 in | Wt 200.2 lb

## 2022-05-23 DIAGNOSIS — Z6834 Body mass index (BMI) 34.0-34.9, adult: Secondary | ICD-10-CM | POA: Diagnosis not present

## 2022-05-23 DIAGNOSIS — E538 Deficiency of other specified B group vitamins: Secondary | ICD-10-CM | POA: Diagnosis not present

## 2022-05-23 DIAGNOSIS — E661 Drug-induced obesity: Secondary | ICD-10-CM

## 2022-05-23 DIAGNOSIS — R4589 Other symptoms and signs involving emotional state: Secondary | ICD-10-CM | POA: Diagnosis not present

## 2022-05-23 MED ORDER — BUPROPION HCL ER (XL) 150 MG PO TB24: 150.0000 mg | ORAL_TABLET | Freq: Every day | ORAL | 2 refills | Status: DC

## 2022-05-23 NOTE — Progress Notes (Signed)
[facility] [place], [place] [address]  Internal MEDICINE  Office Visit Note  Patient Name: Kathryn Estrada  [id number]  [id number]  Date of Service: 05/23/2022  Chief Complaint  Patient presents with   Acute Visit    Weight gain is causing depression.     HPI Kathryn Estrada presents for an acute sick visit for feeling depressed due to weight gain. She has been working out often and watching what she eats but still cannot lose weight.  Wants to take medication as a weight loss aid. Also may need medication for depressive thoughts/mood.  Blood pressure is stable.  She denies any thoughts of self harm or suicidal ideations.  Her B12 level was low in December last year at 179, will need to get an updated B12 level, may also need to continue supplementation.  Her thyroid levels have been fine, there has been some concern in the past that she has neurofibromatosis like her mother but nothing has come from evaluation yet.      Current Medication:  Outpatient Encounter Medications as of 05/23/2022  Medication Sig   buPROPion (WELLBUTRIN XL) 150 MG 24 hr tablet Take 1 tablet (150 mg total) by mouth daily.   Naproxen Sodium (ALEVE PO) Take by mouth.   [DISCONTINUED] meclizine (ANTIVERT) 25 MG tablet Take 1 tablet (25 mg total) by mouth 3 (three) times daily as needed for dizziness. (Patient not taking: Reported on 05/23/2022)   [DISCONTINUED] ondansetron (ZOFRAN) 4 MG tablet Take 1 tablet (4 mg total) by mouth every 8 (eight) hours as needed for nausea or vomiting. (Patient not taking: Reported on 05/23/2022)   [DISCONTINUED] pantoprazole (PROTONIX) 40 MG tablet Take 1 tablet (40 mg total) by mouth daily. (Patient not taking: Reported on 05/23/2022)   [DISCONTINUED] promethazine (PHENERGAN) 25 MG tablet Take 1 tablet (25 mg total) by mouth every 8 (eight) hours as needed for nausea or vomiting. (Patient not taking: Reported on 05/23/2022)   [DISCONTINUED] cyanocobalamin ((VITAMIN  B-12)) injection 1,000 mcg    No facility-administered encounter medications on file as of 05/23/2022.      Medical History: Past Medical History:  Diagnosis Date   Complication of anesthesia    needs a good amount of anesthesia to get numb   Raynaud's syndrome without gangrene      Vital Signs: BP (!) 129/97   Pulse 99   Temp 98.3 F (36.8 C)   Resp 16   Ht '5\' 4"'$  (1.626 m)   Wt 200 lb 3.2 oz (90.8 kg)   SpO2 99%   BMI 34.36 kg/m    Review of Systems  Constitutional:  Positive for fatigue and unexpected weight change. Negative for appetite change and chills.  HENT:  Negative for congestion, rhinorrhea, sneezing and sore throat.   Eyes:  Negative for redness.  Respiratory: Negative.  Negative for cough, chest tightness, shortness of breath and wheezing.   Cardiovascular: Negative.  Negative for chest pain and palpitations.  Gastrointestinal:  Negative for abdominal pain, constipation, diarrhea, nausea and vomiting.  Genitourinary:  Negative for dysuria and frequency.  Musculoskeletal:  Negative for arthralgias, back pain, joint swelling and neck pain.  Skin:  Negative for rash.  Neurological: Negative.  Negative for tremors and numbness.  Hematological:  Negative for adenopathy. Does not bruise/bleed easily.  Psychiatric/Behavioral:  Negative for behavioral problems (Depression), sleep disturbance and suicidal ideas. The patient is not nervous/anxious.     Physical Exam Vitals reviewed.  Constitutional:      General:  She is not in acute distress.    Appearance: Normal appearance. She is obese. She is not ill-appearing.  HENT:     Head: Normocephalic and atraumatic.  Eyes:     Pupils: Pupils are equal, round, and reactive to light.  Cardiovascular:     Rate and Rhythm: Normal rate and regular rhythm.  Pulmonary:     Effort: Pulmonary effort is normal. No respiratory distress.  Neurological:     Mental Status: She is alert and oriented to person, place, and time.   Psychiatric:        Mood and Affect: Mood is depressed. Affect is tearful.       Assessment/Plan: 1. B12 deficiency Will repeat her B12 level soon, may need to start injections.   2. Depressed mood Bupropion 150 mg XR prescribed.   3. 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 Bupropion prescribed as it may help with weight loss.  Obesity Counseling: Risk Assessment: An assessment of behavioral risk factors was made today and includes lack of exercise sedentary lifestyle, lack of portion control and poor dietary habits. The patient has been screened for diabetes and a metabolic test to determine basal metabolic rate has been performed.   Risk Modification Advice: The patient was counseled on portion control guidelines. Safe recommendations on caloric restriction discussed with patient. Sample meals plans were provided. General guidelines on diet and lifestyle modifications were discussed at length. Introducing physical activity as tolerated and at the patient's ability level is recommended.    - Metabolic Test   General Counseling: Kathryn Estrada understanding of the findings of todays visit and agrees with plan of treatment. I have discussed any further diagnostic evaluation that may be needed or ordered today. We also reviewed her medications today. she has been encouraged to call the office with any questions or concerns that should arise related to todays visit.    Counseling:    Orders Placed This Encounter  Procedures   Metabolic Test    Meds ordered this encounter  Medications   buPROPion (WELLBUTRIN XL) 150 MG 24 hr tablet    Sig: Take 1 tablet (150 mg total) by mouth daily.    Dispense:  30 tablet    Refill:  2     Return in about 1 month (around 06/22/2022) for F/U, Weight loss, Kathryn Estrada PCP, eval new med.  [place] Controlled Substance Database was reviewed by me for overdose risk score (ORS)  Time spent:30 Minutes Time spent with patient included reviewing progress notes, labs, imaging studies, and discussing plan for follow up.   This patient was seen by Jonetta Osgood, FNP-C in collaboration with Dr. Clayborn Bigness as a part of collaborative care agreement.  Leverne Amrhein R. Valetta Fuller, MSN, FNP-C Internal Medicine

## 2022-05-24 ENCOUNTER — Encounter: Payer: Self-pay | Admitting: Nurse Practitioner

## 2022-05-29 ENCOUNTER — Encounter: Payer: Self-pay | Admitting: Nurse Practitioner

## 2022-05-29 ENCOUNTER — Ambulatory Visit: Payer: Medicaid Other | Admitting: Nurse Practitioner

## 2022-05-29 VITALS — BP 127/81 | HR 83 | Temp 98.8°F | Resp 16 | Ht 65.0 in | Wt 201.6 lb

## 2022-05-29 DIAGNOSIS — E559 Vitamin D deficiency, unspecified: Secondary | ICD-10-CM | POA: Diagnosis not present

## 2022-05-29 DIAGNOSIS — R7989 Other specified abnormal findings of blood chemistry: Secondary | ICD-10-CM

## 2022-05-29 DIAGNOSIS — E6609 Other obesity due to excess calories: Secondary | ICD-10-CM

## 2022-05-29 DIAGNOSIS — E538 Deficiency of other specified B group vitamins: Secondary | ICD-10-CM

## 2022-05-29 DIAGNOSIS — Z6831 Body mass index (BMI) 31.0-31.9, adult: Secondary | ICD-10-CM

## 2022-05-29 MED ORDER — PHENDIMETRAZINE TARTRATE ER 105 MG PO CP24: 105.0000 mg | ORAL_CAPSULE | Freq: Every day | ORAL | 0 refills | Status: DC

## 2022-05-29 NOTE — Progress Notes (Signed)
[facility] [place], [place] [address]  Internal MEDICINE  Office Visit Note  Patient Name: Kathryn Estrada  [id number]  [id number]  Date of Service: 05/29/2022  Chief Complaint  Patient presents with   Follow-up    Discuss meds   Depression   Headache   Weight Loss    HPI Kathryn Estrada presents for a follow up visit for weight loss managementm depression, headaches, and medication review. Bupropion causing headaches and increased sweating.  Always hungry, gaining weight, last tsh was 4.13 -- need additional labs.   Current Medication: Outpatient Encounter Medications as of 05/29/2022  Medication Sig   Naproxen Sodium (ALEVE PO) Take by mouth.   [DISCONTINUED] buPROPion (WELLBUTRIN XL) 150 MG 24 hr tablet Take 1 tablet (150 mg total) by mouth daily.   [DISCONTINUED] Phendimetrazine Tartrate 105 MG CP24 Take 1 capsule (105 mg total) by mouth daily before breakfast.   No facility-administered encounter medications on file as of 05/29/2022.    Surgical History: Past Surgical History:  Procedure Laterality Date   CESAREAN SECTION N/A 04/03/2021   Procedure: CESAREAN SECTION;  Surgeon: Homero Fellers, MD;  Location: [facility] ORS;  Service: Obstetrics;  Laterality: N/A;    Medical History: Past Medical History:  Diagnosis Date   Complication of anesthesia    needs a good amount of anesthesia to get numb   Raynaud's syndrome without gangrene     Family History: Family History  Problem Relation Age of Onset   Neurofibromatosis Mother    Anemia Mother    Diabetes Other     Social History   Socioeconomic History   Marital status: Married    Spouse name: Dorothea Ogle   Number of children: Not on file   Years of education: Not on file   Highest education level: Not on file  Occupational History   Not on file  Tobacco Use   Smoking status: Every Day    Types: Cigarettes, E-cigarettes    Last attempt to quit: 08/28/2020    Years since quitting: 1.9    Smokeless tobacco: Never   Tobacco comments:    Quit cigarettes 08-28-2020, now vapes   Vaping Use   Vaping Use: Every day   Last attempt to quit: 08/28/2020   Devices: vape   Substance and Sexual Activity   Alcohol use: Not Currently    Comment: ocassinally/ once a month    Drug use: Never   Sexual activity: Yes    Birth control/protection: None    Comment: undecided  Other Topics Concern   Not on file  Social History Narrative   Not on file   Social Determinants of Health   Financial Resource Strain: Not on file  Food Insecurity: Not on file  Transportation Needs: Not on file  Physical Activity: Not on file  Stress: Not on file  Social Connections: Not on file  Intimate Partner Violence: Not on file      Review of Systems  Constitutional:  Positive for diaphoresis, fatigue and unexpected weight change. Negative for appetite change and chills.  HENT:  Negative for congestion, rhinorrhea, sneezing and sore throat.   Eyes:  Negative for redness.  Respiratory: Negative.  Negative for cough, chest tightness, shortness of breath and wheezing.   Cardiovascular: Negative.  Negative for chest pain and palpitations.  Gastrointestinal:  Negative for abdominal pain, constipation, diarrhea, nausea and vomiting.  Genitourinary:  Negative for dysuria and frequency.  Musculoskeletal:  Negative for arthralgias, back pain, joint swelling and neck  pain.  Skin:  Negative for rash.  Neurological:  Positive for headaches. Negative for tremors and numbness.  Hematological:  Negative for adenopathy. Does not bruise/bleed easily.  Psychiatric/Behavioral:  Negative for behavioral problems (Depression), sleep disturbance and suicidal ideas. The patient is not nervous/anxious.     Vital Signs: BP 127/81   Pulse 83   Temp 98.8 F (37.1 C)   Resp 16   Ht '5\' 5"'$  (1.651 m)   Wt 201 lb 9.6 oz (91.4 kg)   SpO2 99%   BMI 33.55 kg/m    Physical Exam Vitals reviewed.  Constitutional:       General: She is not in acute distress.    Appearance: Normal appearance. She is obese. She is not ill-appearing.  HENT:     Head: Normocephalic and atraumatic.  Eyes:     Pupils: Pupils are equal, round, and reactive to light.  Cardiovascular:     Rate and Rhythm: Normal rate and regular rhythm.  Pulmonary:     Effort: Pulmonary effort is normal. No respiratory distress.  Neurological:     Mental Status: She is alert and oriented to person, place, and time.  Psychiatric:        Mood and Affect: Mood is depressed. Affect is tearful.        Assessment/Plan: 1. B12 deficiency Repeat lab to reassess level.  - B12 and Folate Panel  2. Abnormal thyroid blood test Borderline TSH at 4.13, repeat lab - TSH + free T4  3. Vitamin D deficiency Repeat vitamin D level - Vitamin D (25 hydroxy)  4. Class 1 obesity due to excess calories without serious comorbidity with body mass index (BMI) of 31.0 to 31.9 in adult Will try phendimetrazine x1 month, gets lab, follow up 1 month. - TSH + free T4   General Counseling: Dawana verbalizes understanding of the findings of todays visit and agrees with plan of treatment. I have discussed any further diagnostic evaluation that may be needed or ordered today. We also reviewed her medications today. she has been encouraged to call the office with any questions or concerns that should arise related to todays visit.    Orders Placed This Encounter  Procedures   B12 and Folate Panel   Vitamin D (25 hydroxy)   TSH + free T4    Meds ordered this encounter  Medications   DISCONTD: Phendimetrazine Tartrate 105 MG CP24    Sig: Take 1 capsule (105 mg total) by mouth daily before breakfast.    Dispense:  30 capsule    Refill:  0    Do not run through insurance, patient will have good rx information when she comes to pick up the medication    Return in about 1 month (around 06/28/2022) for F/U, Weight loss, Labs, Kathryn Estrada PCP.   Total time spent:30  Minutes Time spent includes review of chart, medications, test results, and follow up plan with the patient.   [place] Controlled Substance Database was reviewed by me.  This patient was seen by Jonetta Osgood, FNP-C in collaboration with Dr. Clayborn Bigness as a part of collaborative care agreement.   Jarett Dralle R. Valetta Fuller, MSN, FNP-C Internal medicine

## 2022-06-28 ENCOUNTER — Ambulatory Visit: Payer: Medicaid Other | Admitting: Nurse Practitioner

## 2022-07-05 LAB — B12 AND FOLATE PANEL
Folate: 10 ng/mL (ref >3.0)
Vitamin B-12: 294 pg/mL (ref 232–1245)

## 2022-07-05 LAB — TSH+FREE T4
Free T4: 0.97 ng/dL (ref 0.82–1.77)
TSH: 2.75 u[IU]/mL (ref 0.450–4.500)

## 2022-07-05 LAB — VITAMIN D 25 HYDROXY (VIT D DEFICIENCY, FRACTURES): Vit D, 25-Hydroxy: 24 ng/mL — ABNORMAL LOW (ref 30.0–100.0)

## 2022-07-09 ENCOUNTER — Encounter: Payer: Self-pay | Admitting: Nurse Practitioner

## 2022-07-17 ENCOUNTER — Ambulatory Visit: Payer: Medicaid Other | Admitting: Nurse Practitioner

## 2022-07-17 ENCOUNTER — Encounter: Payer: Self-pay | Admitting: Nurse Practitioner

## 2022-07-17 VITALS — BP 122/74 | HR 90 | Temp 98.5°F | Resp 16 | Ht 65.0 in | Wt 200.6 lb

## 2022-07-17 DIAGNOSIS — E538 Deficiency of other specified B group vitamins: Secondary | ICD-10-CM

## 2022-07-17 DIAGNOSIS — Z6831 Body mass index (BMI) 31.0-31.9, adult: Secondary | ICD-10-CM

## 2022-07-17 DIAGNOSIS — L409 Psoriasis, unspecified: Secondary | ICD-10-CM

## 2022-07-17 DIAGNOSIS — E559 Vitamin D deficiency, unspecified: Secondary | ICD-10-CM

## 2022-07-17 DIAGNOSIS — E6609 Other obesity due to excess calories: Secondary | ICD-10-CM

## 2022-07-17 MED ORDER — VITAMIN D (ERGOCALCIFEROL) 1.25 MG (50000 UNIT) PO CAPS: 50000.0000 [IU] | ORAL_CAPSULE | ORAL | 1 refills | Status: DC

## 2022-07-17 MED ORDER — TOPIRAMATE 25 MG PO TABS: 25.0000 mg | ORAL_TABLET | Freq: Two times a day (BID) | ORAL | 2 refills | Status: DC

## 2022-07-17 MED ORDER — TRIAMCINOLONE ACETONIDE 0.1 % EX CREA: 1.0000 | TOPICAL_CREAM | Freq: Two times a day (BID) | CUTANEOUS | 0 refills | Status: DC | PRN

## 2022-07-17 MED ORDER — CALCIPOTRIENE 0.005 % EX CREA: TOPICAL_CREAM | Freq: Two times a day (BID) | CUTANEOUS | 2 refills | Status: DC

## 2022-07-17 MED ORDER — CYANOCOBALAMIN 1000 MCG/ML IJ SOLN
1000.0000 ug | Freq: Once | INTRAMUSCULAR | Status: AC
  Administered 2022-07-17: 1000 ug via INTRAMUSCULAR

## 2022-07-17 NOTE — Progress Notes (Signed)
[facility] [place], [place] [address]  Internal MEDICINE  Office Visit Note  Patient Name: Kathryn Estrada  [id number]  [id number]  Date of Service: 07/17/2022  Chief Complaint  Patient presents with   Follow-up   Quality Metric Gaps    Tetanus Vaccine   Medication Reaction    Phendimetrazine    HPI Kathryn Estrada presents for a follow up visit for weight loss management, fatigue and to discuss lab results.  Labs discussed: --A1C is normal --thyroid levels are normal --B12 is low at 294; folate is normal at 10 --vitamin D is low at 24.  Reports red patch on sclera every night, flares up and has been going on for a while. Also has symptoms consistent with neurofibromatosis but diagnostic imaging/testing has been inconclusive or negative so far. She has been referred to neurology for further evaluation and monitoring.  --reports significant fatigue, low energy, difficulty losing weight, hair loss, increased appetite/hunger, no feeling of satiety when eating. Tried phendimetrazine ER but this caused significant insomnia like phentermine does for her.  --has chronic Right knee plaque and left elbow plaque, comes and goes, sunlight helps, used some sort of cream prior and did not help. Area is red, raised and scaly, resembles psoriasis. Try topicals again   Current Medication: Outpatient Encounter Medications as of 07/17/2022  Medication Sig   calcipotriene (DOVONOX) 0.005 % cream Apply topically 2 (two) times daily. To affected area until resolved.   Naproxen Sodium (ALEVE PO) Take by mouth.   topiramate (TOPAMAX) 25 MG tablet Take 1 tablet (25 mg total) by mouth 2 (two) times daily.   triamcinolone cream (KENALOG) 0.1 % Apply 1 Application topically 2 (two) times daily as needed (itching and rash). To affected area.   Vitamin D, Ergocalciferol, (DRISDOL) 1.25 MG (50000 UNIT) CAPS capsule Take 1 capsule (50,000 Units total) by mouth every 7 (seven) days.    [DISCONTINUED] Phendimetrazine Tartrate 105 MG CP24 Take 1 capsule (105 mg total) by mouth daily before breakfast.   Facility-Administered Encounter Medications as of 07/17/2022  Medication   cyanocobalamin ((VITAMIN B-12)) injection 1,000 mcg    Surgical History: Past Surgical History:  Procedure Laterality Date   CESAREAN SECTION N/A 04/03/2021   Procedure: CESAREAN SECTION;  Surgeon: Homero Fellers, MD;  Location: [facility] ORS;  Service: Obstetrics;  Laterality: N/A;    Medical History: Past Medical History:  Diagnosis Date   Complication of anesthesia    needs a good amount of anesthesia to get numb   Raynaud's syndrome without gangrene     Family History: Family History  Problem Relation Age of Onset   Neurofibromatosis Mother    Anemia Mother    Diabetes Other     Social History   Socioeconomic History   Marital status: Married    Spouse name: Dorothea Ogle   Number of children: Not on file   Years of education: Not on file   Highest education level: Not on file  Occupational History   Not on file  Tobacco Use   Smoking status: Every Day    Types: Cigarettes, E-cigarettes    Last attempt to quit: 08/28/2020    Years since quitting: 1.8   Smokeless tobacco: Never   Tobacco comments:    Quit cigarettes 08-28-2020, now vapes   Vaping Use   Vaping Use: Every day   Last attempt to quit: 08/28/2020   Devices: vape   Substance and Sexual Activity   Alcohol use: Not Currently  Comment: ocassinally/ once a month    Drug use: Never   Sexual activity: Yes    Birth control/protection: None    Comment: undecided  Other Topics Concern   Not on file  Social History Narrative   Not on file   Social Determinants of Health   Financial Resource Strain: Not on file  Food Insecurity: Not on file  Transportation Needs: Not on file  Physical Activity: Not on file  Stress: Not on file  Social Connections: Not on file  Intimate Partner Violence: Not on file       Review of Systems  Constitutional:  Positive for appetite change (increased, feels hungry all the time), fatigue and unexpected weight change. Negative for chills.  HENT:  Negative for congestion, rhinorrhea, sneezing and sore throat.   Eyes:  Negative for redness.  Respiratory: Negative.  Negative for cough, chest tightness, shortness of breath and wheezing.   Cardiovascular: Negative.  Negative for chest pain and palpitations.  Gastrointestinal:  Negative for abdominal pain, constipation, diarrhea, nausea and vomiting.  Endocrine: Positive for polyphagia. Negative for polydipsia and polyuria.  Genitourinary:  Negative for dysuria and frequency.  Musculoskeletal:  Negative for arthralgias, back pain, joint swelling and neck pain.  Skin:  Positive for rash (right knee and left elbow red raised plaque with scales).  Neurological:  Positive for headaches. Negative for tremors and numbness.  Hematological:  Negative for adenopathy. Does not bruise/bleed easily.  Psychiatric/Behavioral:  Positive for behavioral problems (Depression), decreased concentration and sleep disturbance. Negative for self-injury and suicidal ideas. The patient is nervous/anxious.     Vital Signs: BP 122/74   Pulse 90   Temp 98.5 F (36.9 C)   Resp 16   Ht '5\' 5"'$  (1.651 m)   Wt 200 lb 9.6 oz (91 kg)   SpO2 99%   BMI 33.38 kg/m    Physical Exam Vitals reviewed.  Constitutional:      General: She is not in acute distress.    Appearance: Normal appearance. She is obese. She is not ill-appearing.  HENT:     Head: Normocephalic and atraumatic.  Eyes:     Pupils: Pupils are equal, round, and reactive to light.  Cardiovascular:     Rate and Rhythm: Normal rate and regular rhythm.  Pulmonary:     Effort: Pulmonary effort is normal. No respiratory distress.  Skin:    Findings: Rash present. Rash is papular (2 large plaques -- right knee and left elbow) and scaling (chronic scaling plaque that may  resolve but always comes back eventually).  Neurological:     Mental Status: She is alert and oriented to person, place, and time.  Psychiatric:        Mood and Affect: Mood is depressed. Affect is tearful.        Assessment/Plan: 1. B12 deficiency B12 injection administered in office today, recommend weekly injection x2 more weeks then continue monthly for 5 months, repeat lab in 6 months.  - cyanocobalamin ((VITAMIN B-12)) injection 1,000 mcg  2. Psoriasis, unspecified Most likely psoriasis plaque on right knee and left elbow. Vitamin D level is low at 24, prescribed weekly vitamin D supplement to treat low vitamin D and to see if it helps improve her psoriasis-like plaques. Encouraged direct sun exposure as much as possible without causing sunburn, 10 min daily is adequate. Prescribed triamcinolone cream for psoriasis plaques, apply daily. Also prescribed calcipotriene, may need prior authorization, see if insurance will approve. Follow up in 1 month.  -  Vitamin D, Ergocalciferol, (DRISDOL) 1.25 MG (50000 UNIT) CAPS capsule; Take 1 capsule (50,000 Units total) by mouth every 7 (seven) days.  Dispense: 12 capsule; Refill: 1 - calcipotriene (DOVONOX) 0.005 % cream; Apply topically 2 (two) times daily. To affected area until resolved.  Dispense: 60 g; Refill: 2 - triamcinolone cream (KENALOG) 0.1 %; Apply 1 Application topically 2 (two) times daily as needed (itching and rash). To affected area.  Dispense: 30 g; Refill: 0  3. Vitamin D deficiency Weekly prescription supplement prescribed. Repeat level in 6 months.  - Vitamin D, Ergocalciferol, (DRISDOL) 1.25 MG (50000 UNIT) CAPS capsule; Take 1 capsule (50,000 Units total) by mouth every 7 (seven) days.  Dispense: 12 capsule; Refill: 1  4. Class 1 obesity due to excess calories without serious comorbidity with body mass index (BMI) of 31.0 to 31.9 in adult Add topiramate twice daily for now. Must be careful to avoid stimulants that may  increase insomnia. Phendimetrazine discontinued  - topiramate (TOPAMAX) 25 MG tablet; Take 1 tablet (25 mg total) by mouth 2 (two) times daily.  Dispense: 60 tablet; Refill: 2   General Counseling: Lynann verbalizes understanding of the findings of todays visit and agrees with plan of treatment. I have discussed any further diagnostic evaluation that may be needed or ordered today. We also reviewed her medications today. she has been encouraged to call the office with any questions or concerns that should arise related to todays visit.    No orders of the defined types were placed in this encounter.   Meds ordered this encounter  Medications   Vitamin D, Ergocalciferol, (DRISDOL) 1.25 MG (50000 UNIT) CAPS capsule    Sig: Take 1 capsule (50,000 Units total) by mouth every 7 (seven) days.    Dispense:  12 capsule    Refill:  1   cyanocobalamin ((VITAMIN B-12)) injection 1,000 mcg   topiramate (TOPAMAX) 25 MG tablet    Sig: Take 1 tablet (25 mg total) by mouth 2 (two) times daily.    Dispense:  60 tablet    Refill:  2   calcipotriene (DOVONOX) 0.005 % cream    Sig: Apply topically 2 (two) times daily. To affected area until resolved.    Dispense:  60 g    Refill:  2   triamcinolone cream (KENALOG) 0.1 %    Sig: Apply 1 Application topically 2 (two) times daily as needed (itching and rash). To affected area.    Dispense:  30 g    Refill:  0    Return in about 1 month (around 08/17/2022) for F/U, Weight loss, Dominque Levandowski PCP, nurse visit B12 inj x2 more weeks then monthly. .   Total time spent:30 Minutes Time spent includes review of chart, medications, test results, and follow up plan with the patient.   [place] Controlled Substance Database was reviewed by me.  This patient was seen by Jonetta Osgood, FNP-C in collaboration with Dr. Clayborn Bigness as a part of collaborative care agreement.   Ibtisam Benge R. Valetta Fuller, MSN, FNP-C Internal medicine

## 2022-07-28 ENCOUNTER — Encounter: Payer: Self-pay | Admitting: Nurse Practitioner

## 2022-08-20 ENCOUNTER — Telehealth: Payer: Self-pay

## 2022-08-20 ENCOUNTER — Encounter: Payer: Self-pay | Admitting: Nurse Practitioner

## 2022-08-20 ENCOUNTER — Ambulatory Visit: Payer: Medicaid Other | Admitting: Nurse Practitioner

## 2022-08-20 VITALS — BP 120/80 | HR 93 | Temp 98.4°F | Resp 16 | Ht 65.0 in | Wt 203.6 lb

## 2022-08-20 DIAGNOSIS — E6609 Other obesity due to excess calories: Secondary | ICD-10-CM

## 2022-08-20 DIAGNOSIS — L813 Cafe au lait spots: Secondary | ICD-10-CM

## 2022-08-20 DIAGNOSIS — R200 Anesthesia of skin: Secondary | ICD-10-CM

## 2022-08-20 DIAGNOSIS — H538 Other visual disturbances: Secondary | ICD-10-CM

## 2022-08-20 DIAGNOSIS — E559 Vitamin D deficiency, unspecified: Secondary | ICD-10-CM

## 2022-08-20 DIAGNOSIS — R42 Dizziness and giddiness: Secondary | ICD-10-CM

## 2022-08-20 DIAGNOSIS — E538 Deficiency of other specified B group vitamins: Secondary | ICD-10-CM

## 2022-08-20 DIAGNOSIS — H5710 Ocular pain, unspecified eye: Secondary | ICD-10-CM

## 2022-08-20 DIAGNOSIS — Z8279 Family history of other congenital malformations, deformations and chromosomal abnormalities: Secondary | ICD-10-CM

## 2022-08-20 DIAGNOSIS — L409 Psoriasis, unspecified: Secondary | ICD-10-CM | POA: Diagnosis not present

## 2022-08-20 DIAGNOSIS — D3615 Benign neoplasm of peripheral nerves and autonomic nervous system of abdomen: Secondary | ICD-10-CM

## 2022-08-20 MED ORDER — MECLIZINE HCL 25 MG PO TABS: 12.5000 mg | ORAL_TABLET | Freq: Three times a day (TID) | ORAL | 2 refills | Status: DC | PRN

## 2022-08-20 NOTE — Progress Notes (Signed)
[facility] [place], [place] [address]  Internal MEDICINE  Office Visit Note  Patient Name: Kathryn Estrada  [id number]  [id number]  Date of Service: 08/20/2022  Chief Complaint  Patient presents with   Follow-up   Quality Metric Gaps    Tetanus Vaccine    HPI Chayil presents for a follow up visit for dizziness, neurofibromas and psoriasis.  --dizziness, blurry vision, somnolence and brain fog  --2 possible neurofibroma in abdomen RUQ x1 and LUQ x1 --psoriasis -- sun and topical helped some with plaques but still there --Weight loss interested but on hold.    Current Medication: Outpatient Encounter Medications as of 08/20/2022  Medication Sig   calcipotriene (DOVONOX) 0.005 % cream Apply topically 2 (two) times daily. To affected area until resolved.   meclizine (ANTIVERT) 25 MG tablet Take 0.5-1 tablets (12.5-25 mg total) by mouth 3 (three) times daily as needed for dizziness.   Naproxen Sodium (ALEVE PO) Take by mouth.   triamcinolone cream (KENALOG) 0.1 % Apply 1 Application topically 2 (two) times daily as needed (itching and rash). To affected area.   Vitamin D, Ergocalciferol, (DRISDOL) 1.25 MG (50000 UNIT) CAPS capsule Take 1 capsule (50,000 Units total) by mouth every 7 (seven) days.   [DISCONTINUED] topiramate (TOPAMAX) 25 MG tablet Take 1 tablet (25 mg total) by mouth 2 (two) times daily.   No facility-administered encounter medications on file as of 08/20/2022.    Surgical History: Past Surgical History:  Procedure Laterality Date   CESAREAN SECTION N/A 04/03/2021   Procedure: CESAREAN SECTION;  Surgeon: Homero Fellers, MD;  Location: [facility] ORS;  Service: Obstetrics;  Laterality: N/A;    Medical History: Past Medical History:  Diagnosis Date   Complication of anesthesia    needs a good amount of anesthesia to get numb   Raynaud's syndrome without gangrene     Family History: Family History  Problem Relation Age of Onset    Neurofibromatosis Mother    Anemia Mother    Diabetes Other     Social History   Socioeconomic History   Marital status: Married    Spouse name: Dorothea Ogle   Number of children: Not on file   Years of education: Not on file   Highest education level: Not on file  Occupational History   Not on file  Tobacco Use   Smoking status: Every Day    Types: Cigarettes, E-cigarettes    Last attempt to quit: 08/28/2020    Years since quitting: 1.9   Smokeless tobacco: Never   Tobacco comments:    Quit cigarettes 08-28-2020, now vapes   Vaping Use   Vaping Use: Every day   Last attempt to quit: 08/28/2020   Devices: vape   Substance and Sexual Activity   Alcohol use: Not Currently    Comment: ocassinally/ once a month    Drug use: Never   Sexual activity: Yes    Birth control/protection: None    Comment: undecided  Other Topics Concern   Not on file  Social History Narrative   Not on file   Social Determinants of Health   Financial Resource Strain: Not on file  Food Insecurity: Not on file  Transportation Needs: Not on file  Physical Activity: Not on file  Stress: Not on file  Social Connections: Not on file  Intimate Partner Violence: Not on file      Review of Systems  Constitutional:  Positive for fatigue. Negative for chills and unexpected weight change.  HENT: Negative.  Negative for congestion, rhinorrhea, sneezing and sore throat.   Eyes:  Positive for visual disturbance (blurry vision). Negative for redness.  Respiratory:  Negative for cough, chest tightness, shortness of breath and wheezing.   Cardiovascular:  Negative for chest pain and palpitations.  Gastrointestinal:  Negative for abdominal pain, constipation, diarrhea, nausea and vomiting.  Genitourinary:  Negative for dysuria and frequency.  Musculoskeletal:  Positive for neck pain (left sided). Negative for arthralgias, back pain and joint swelling.  Skin:  Negative for rash.       Brown birthmark to right  buttocks, subcutaneous nodule left chest; palpable and visible lump of RUQx1 and LUQx1  Neurological:  Positive for dizziness, numbness (numbness and tingling on left side of face below left eye) and headaches. Negative for tremors.  Hematological:  Negative for adenopathy. Does not bruise/bleed easily.  Psychiatric/Behavioral:  Positive for behavioral problems (Depression) and sleep disturbance. Negative for self-injury and suicidal ideas. The patient is nervous/anxious.     Vital Signs: BP 120/80   Pulse 93   Temp 98.4 F (36.9 C)   Resp 16   Ht '5\' 5"'$  (1.651 m)   Wt 203 lb 9.6 oz (92.4 kg)   SpO2 99%   BMI 33.88 kg/m    Physical Exam Vitals reviewed.  Constitutional:      General: She is not in acute distress.    Appearance: Normal appearance. She is obese. She is not ill-appearing.  HENT:     Head: Normocephalic and atraumatic.  Eyes:     Pupils: Pupils are equal, round, and reactive to light.  Cardiovascular:     Rate and Rhythm: Normal rate and regular rhythm.  Pulmonary:     Effort: Pulmonary effort is normal. No respiratory distress.  Abdominal:     General: Bowel sounds are normal.     Palpations: Abdomen is soft. There is mass (as noted in the diagram and further described in comments).     Tenderness: There is no abdominal tenderness.       Comments: The 2 areas marked above are where a superficial lump was found soft, nontender. Resembling a neurofibroma but at this time are of unknown etiology.  Neurological:     Mental Status: She is alert and oriented to person, place, and time.  Psychiatric:        Mood and Affect: Mood normal.        Behavior: Behavior normal.        Assessment/Plan: 1. Psoriasis, unspecified Improved with sun light and topical steroid. Continue as needed. If no improvement or worsening of plaques, call the clinic, may need to consider other treatment regimens   2. Dizziness Take meclizine as needed for episodes of dizziness -  meclizine (ANTIVERT) 25 MG tablet; Take 0.5-1 tablets (12.5-25 mg total) by mouth 3 (three) times daily as needed for dizziness. (Patient not taking: Reported on 08/30/2022)  Dispense: 30 tablet; Refill: 2  3. Spot, cafe-au-lait Refer to neurology for further evaluation of neurofibromatosis -- and possible symptoms including problems #4-6.  - Ambulatory referral to Neurology  4. Left facial numbness See problem #3 - Ambulatory referral to Neurology  5. Family history of neurofibromatosis See problem #3 - Ambulatory referral to Neurology  6. Neurofibroma of abdominal wall See problem #3 - Ambulatory referral to Neurology  7. Blurry vision, bilateral Need to see eye doctor, referral ordered - Ambulatory referral to Ophthalmology  8. Pain on movement of eye Refer to eye doctor. - Ambulatory  referral to Ophthalmology   General Counseling: Raford Pitcher understanding of the findings of todays visit and agrees with plan of treatment. I have discussed any further diagnostic evaluation that may be needed or ordered today. We also reviewed her medications today. she has been encouraged to call the office with any questions or concerns that should arise related to todays visit.    Orders Placed This Encounter  Procedures   Ambulatory referral to Neurology   Ambulatory referral to Ophthalmology    Meds ordered this encounter  Medications   meclizine (ANTIVERT) 25 MG tablet    Sig: Take 0.5-1 tablets (12.5-25 mg total) by mouth 3 (three) times daily as needed for dizziness.    Dispense:  30 tablet    Refill:  2    Return in about 4 weeks (around 09/17/2022) for F/U, Taia Bramlett PCP.   Total time spent:30 Minutes Time spent includes review of chart, medications, test results, and follow up plan with the patient.    Controlled Substance Database was reviewed by me.  This patient was seen by Jonetta Osgood, FNP-C in collaboration with Dr. Clayborn Bigness as a part of collaborative  care agreement.   Kadan Millstein R. Valetta Fuller, MSN, FNP-C Internal medicine

## 2022-08-20 NOTE — Telephone Encounter (Signed)
Awaiting 08/20/22 office notes for Urgent Ophthalmology referral-[facility]

## 2022-08-30 ENCOUNTER — Ambulatory Visit (INDEPENDENT_AMBULATORY_CARE_PROVIDER_SITE_OTHER): Payer: Medicaid Other

## 2022-08-30 VITALS — BP 120/80 | Ht 65.0 in | Wt 202.0 lb

## 2022-08-30 DIAGNOSIS — N912 Amenorrhea, unspecified: Secondary | ICD-10-CM

## 2022-08-30 LAB — POCT URINE PREGNANCY: Preg Test, Ur: POSITIVE — AB

## 2022-08-30 NOTE — Progress Notes (Signed)
Subjective:    Kathryn Estrada is a 26 y.o. female who presents for evaluation of amenorrhea. She believes she could be pregnant. Pregnancy is desired.  Last period was normal.   07/25/22-LMP    Lab Review Urine HCG: positive    Assessment:    Absence of menstruation.     Plan:    Pregnancy Test:  Positive: EDC: 05-01-23. Briefly discussed positive results and sent to check out for scheduling of New OB appointments.

## 2022-09-09 ENCOUNTER — Telehealth: Payer: Self-pay

## 2022-09-09 NOTE — Telephone Encounter (Signed)
Left voicemail to return call. 

## 2022-09-09 NOTE — Telephone Encounter (Signed)
TRIAGE VOICEMAIL: Patient reports she is almost [redacted] weeks pregnant. She is having really bad cramping without bleeding. She did not have this with her first pregnancy. She is having bad nausea. Her NOB is not til the end of October. Inquiring about nausea medication.

## 2022-09-09 NOTE — Telephone Encounter (Signed)
Patient reports she is having sharp stabbing pains intermittently since Saturday night. She awakened and went to restroom thinking she was having miscarriage, but there has been no bleeding. She reports she has not had fever but has had chills & feels "sick" like with flu without any other symptoms.She has not been around anyone that is sick that she is aware of. Advised needs to report to ED for evaluation. We do not have ultrasound in office to diagnose pregnancy location.   NAUSEA ASSESSMENT: Ability to retain Fluids: Yes Any signs of dehydration: dry cracked lips,decreased urination, dizziness: No  Plan: -Try saltine cracker prior to getting out of bed -Eat small frequent bland meals -Do not allow empty stomach or overly full stomach -Avoid greasy, spicy, highly acidic foods or foods with stong odor -Take prenatal vitamin at night with a snack -Ginger 250 mg four times per day or ginger tea -Vitamin B6 25 mg three times per day and 1 Unisom 25 mg at night -Pepcide or zantac per directions on package

## 2022-09-11 ENCOUNTER — Encounter: Payer: Self-pay | Admitting: Advanced Practice Midwife

## 2022-09-11 ENCOUNTER — Other Ambulatory Visit: Payer: Self-pay | Admitting: Advanced Practice Midwife

## 2022-09-11 DIAGNOSIS — O219 Vomiting of pregnancy, unspecified: Secondary | ICD-10-CM

## 2022-09-11 MED ORDER — ONDANSETRON 4 MG PO TBDP: 4.0000 mg | ORAL_TABLET | Freq: Four times a day (QID) | ORAL | 2 refills | Status: DC | PRN

## 2022-09-11 NOTE — Progress Notes (Signed)
Rx zofran sent per patient request for N/V in early pregnancy.

## 2022-09-18 ENCOUNTER — Ambulatory Visit: Payer: Medicaid Other | Admitting: Nurse Practitioner

## 2022-09-18 ENCOUNTER — Telehealth: Payer: Self-pay | Admitting: Nurse Practitioner

## 2022-09-18 ENCOUNTER — Ambulatory Visit (INDEPENDENT_AMBULATORY_CARE_PROVIDER_SITE_OTHER): Payer: Medicaid Other

## 2022-09-18 VITALS — Wt 200.0 lb

## 2022-09-18 DIAGNOSIS — Z369 Encounter for antenatal screening, unspecified: Secondary | ICD-10-CM

## 2022-09-18 DIAGNOSIS — Z3480 Encounter for supervision of other normal pregnancy, unspecified trimester: Secondary | ICD-10-CM | POA: Insufficient documentation

## 2022-09-18 NOTE — Telephone Encounter (Signed)
Urgent Ophthalmology referral sent via [facility] to [facility]

## 2022-09-18 NOTE — Progress Notes (Signed)
New OB Intake  I connected with  [name] on 09/18/22 at 10:15 AM EDT by telephone and verified that I am speaking with the correct person using two identifiers. Nurse is located at [employer] and pt is located at home.  I explained I am completing New OB Intake today. We discussed her EDD of 05/01/2023 that is based on LMP of 07/31/2022. Pt is G2/P1001. I reviewed her allergies, medications, Medical/Surgical/OB history, and appropriate screenings. Based on history, this is a/an pregnancy uncomplicated .   Patient Active Problem List   Diagnosis Date Noted   Cesarean delivery delivered 04/04/2021   Postpartum care and examination of lactating mother 04/04/2021   Complete breech presentation 04/03/2021   [redacted] weeks gestation of pregnancy    Breech presentation, no version 03/15/2021   Supervision of other normal pregnancy, antepartum 08/29/2020   B12 deficiency 04/16/2020   Tobacco use disorder 03/28/2020   Abnormal thyroid blood test 03/12/2020   BMI 29.0-29.9,adult 03/12/2020   Raynaud's phenomenon without gangrene 01/30/2020   Prolonged capillary refill time 01/30/2020   Bilateral cold feet 01/30/2020   Other fatigue 01/30/2020   Dysuria 01/30/2020    Concerns addressed today Pt asked if quantity of zofran could be increased to one month's worth with refills; adv will send msg to [facility].  Pt states with her last preg she went about three weeks without having a BM; adv per new constipation protocol; pt states she is using Miralax; adv that was okay; pt also requests a rx for miralax; says her PCP rx'd for her one time before.  Delivery Plans:  Plans to deliver at [facility].  Anatomy US Explained first u/s will be scheduled soon and an anatomy scan will be scheduled for 20 weeks.  Labs Discussed genetic screening with patient. Patient declines genetic testing. Discussed possible labs to be drawn at new OB appointment.  COVID Vaccine Patient has not had  COVID vaccine.   Social Determinants of Health Food Insecurity: denies food insecurity Transportation: Patient denies transportation needs. Childcare: Discussed no children allowed at ultrasound appointments.   First visit review I reviewed new OB appt with pt. I explained she will have ob bloodwork and pap smear/pelvic exam if indicated. Explained pt will be seen by Roberto Scales, CNM at first visit; encounter routed to appropriate provider.   Cleophas Dunker, [facility] 09/18/2022  10:36 AM

## 2022-09-26 ENCOUNTER — Telehealth: Payer: Self-pay

## 2022-09-26 ENCOUNTER — Other Ambulatory Visit: Payer: Self-pay | Admitting: Advanced Practice Midwife

## 2022-09-26 DIAGNOSIS — O219 Vomiting of pregnancy, unspecified: Secondary | ICD-10-CM

## 2022-09-26 NOTE — Telephone Encounter (Signed)
Pt called stating she is [redacted] weeks pregnant per LMP R shoulder pain for the last 3 hours but no vaginal bleeding or cramping. Pt states she is not having any other symptoms at this time but is concerned of possible ectopic pregnancy. Pt advised at this time to continue to monitor symptoms and call back if anything new worsens or develops IE vaginal bleeding or cramping. Pt verbalized understanding and states no other concerns at this time.

## 2022-09-27 ENCOUNTER — Emergency Department
Admission: EM | Admit: 2022-09-27 | Discharge: 2022-09-27 | Disposition: A | Discharge status: Home / Self Care | Payer: Medicaid Other | Attending: Emergency Medicine | Admitting: Emergency Medicine

## 2022-09-27 ENCOUNTER — Telehealth: Payer: Self-pay

## 2022-09-27 ENCOUNTER — Other Ambulatory Visit: Payer: Self-pay

## 2022-09-27 ENCOUNTER — Emergency Department: Payer: Medicaid Other

## 2022-09-27 DIAGNOSIS — M25511 Pain in right shoulder: Secondary | ICD-10-CM | POA: Diagnosis not present

## 2022-09-27 DIAGNOSIS — M545 Low back pain, unspecified: Secondary | ICD-10-CM | POA: Diagnosis not present

## 2022-09-27 DIAGNOSIS — O26891 Other specified pregnancy related conditions, first trimester: Secondary | ICD-10-CM | POA: Insufficient documentation

## 2022-09-27 DIAGNOSIS — R110 Nausea: Secondary | ICD-10-CM | POA: Diagnosis not present

## 2022-09-27 DIAGNOSIS — Z3A09 9 weeks gestation of pregnancy: Secondary | ICD-10-CM | POA: Diagnosis not present

## 2022-09-27 LAB — COMPREHENSIVE METABOLIC PANEL
ALT: 16 U/L (ref 0–44)
AST: 21 U/L (ref 15–41)
Albumin: 4.2 g/dL (ref 3.5–5.0)
Alkaline Phosphatase: 68 U/L (ref 38–126)
Anion gap: 8 (ref 5–15)
BUN: 7 mg/dL (ref 6–20)
CO2: 24 mmol/L (ref 22–32)
Calcium: 9.3 mg/dL (ref 8.9–10.3)
Chloride: 105 mmol/L (ref 98–111)
Creatinine, Ser: 0.62 mg/dL (ref 0.44–1.00)
GFR, Estimated: >60 mL/min (ref >60)
Glucose, Bld: 84 mg/dL (ref 70–99)
Potassium: 3.5 mmol/L (ref 3.5–5.1)
Sodium: 137 mmol/L (ref 135–145)
Total Bilirubin: 0.4 mg/dL (ref 0.3–1.2)
Total Protein: 7.7 g/dL (ref 6.5–8.1)

## 2022-09-27 LAB — CBC WITH DIFFERENTIAL/PLATELET
Abs Immature Granulocytes: 0.02 10*3/uL (ref 0.00–0.07)
Basophils Absolute: 0 10*3/uL (ref 0.0–0.1)
Basophils Relative: 1 %
Eosinophils Absolute: 0.1 10*3/uL (ref 0.0–0.5)
Eosinophils Relative: 1 %
HCT: 46.3 % — ABNORMAL HIGH (ref 36.0–46.0)
Hemoglobin: 14.5 g/dL (ref 12.0–15.0)
Immature Granulocytes: 0 %
Lymphocytes Relative: 22 %
Lymphs Abs: 1.7 10*3/uL (ref 0.7–4.0)
MCH: 27.3 pg (ref 26.0–34.0)
MCHC: 31.3 g/dL (ref 30.0–36.0)
MCV: 87 fL (ref 80.0–100.0)
Monocytes Absolute: 0.6 10*3/uL (ref 0.1–1.0)
Monocytes Relative: 7 %
Neutro Abs: 5.3 10*3/uL (ref 1.7–7.7)
Neutrophils Relative %: 69 %
Platelets: 243 10*3/uL (ref 150–400)
RBC: 5.32 MIL/uL — ABNORMAL HIGH (ref 3.87–5.11)
RDW: 12.4 % (ref 11.5–15.5)
WBC: 7.8 10*3/uL (ref 4.0–10.5)
nRBC: 0 % (ref 0.0–0.2)

## 2022-09-27 LAB — URINALYSIS, ROUTINE W REFLEX MICROSCOPIC
Bilirubin Urine: NEGATIVE
Glucose, UA: NEGATIVE mg/dL
Hgb urine dipstick: NEGATIVE
Ketones, ur: NEGATIVE mg/dL
Leukocytes,Ua: NEGATIVE
Nitrite: NEGATIVE
Protein, ur: NEGATIVE mg/dL
Specific Gravity, Urine: 1.016 (ref 1.005–1.030)
pH: 7 (ref 5.0–8.0)

## 2022-09-27 LAB — HCG, QUANTITATIVE, PREGNANCY: hCG, Beta Chain, Quant, S: 200230 m[IU]/mL — ABNORMAL HIGH (ref <5)

## 2022-09-27 MED ORDER — ONDANSETRON 4 MG PO TBDP: 4.0000 mg | ORAL_TABLET | Freq: Three times a day (TID) | ORAL | 0 refills | Status: DC | PRN

## 2022-09-27 MED ORDER — ONDANSETRON 4 MG PO TBDP
4.0000 mg | ORAL_TABLET | Freq: Once | ORAL | Status: AC
  Administered 2022-09-27: 4 mg via ORAL
  Filled 2022-09-27: qty 1

## 2022-09-27 NOTE — Telephone Encounter (Signed)
Pt called triage reporting shoulder pain and cramping states the pain is so bad she is going to the ER. Per Dr Marcelline Mates she should go because we do not have ultrasound in office today and we can not rule out ectopic pregnancy. Pt aware to go to ER.

## 2022-09-27 NOTE — Discharge Instructions (Addendum)
Take the Zofran as needed for nausea.  Follow-up with [facility] as planned.  Return to the ER for new, worsening, or persistent pain, vomiting, any vaginal bleeding or discharge, fever, urinary symptoms, or any other new or worsening symptoms that concern you.

## 2022-09-27 NOTE — ED Triage Notes (Signed)
Pt presents via POV with c/o abdominal cramping & shoulder pain that began yesterday. Pt denies vaginal bleeding. This is patients 2nd pregancy- currently 9 weeks. No issues with first. Pt last period August 3rd. Pt will be seeing [place] [facility] for care.

## 2022-09-27 NOTE — ED Provider Notes (Signed)
[facility] Provider Note    Event Date/Time   First MD Initiated Contact with Patient 09/27/22 1030     (approximate)   History   Abdominal Cramping   HPI  Kathryn Estrada is a 26 y.o. female G2P1 at 9 weeks by LMP who presents with crampy pain in her lower abdomen and back since last night associated with some right shoulder pain.  She reports ongoing nausea from morning sickness but no vomiting.  She denies any vaginal bleeding or discharge.  She has no urinary symptoms.    Physical Exam   Triage Vital Signs: ED Triage Vitals  Enc Vitals Group     BP 09/27/22 0955 134/79     Pulse Rate 09/27/22 0955 (!) 103     Resp 09/27/22 0955 18     Temp 09/27/22 0955 97.9 F (36.6 C)     Temp Source 09/27/22 0955 Oral     SpO2 09/27/22 0955 100 %     Weight --      Height --      Head Circumference --      Peak Flow --      Pain Score 09/27/22 1004 5     Pain Loc --      Pain Edu? --      Excl. in [place]? --     Most recent vital signs: Vitals:   09/27/22 0955  BP: 134/79  Pulse: (!) 103  Resp: 18  Temp: 97.9 F (36.6 C)  SpO2: 100%     General: Awake, no distress.  CV:  Good peripheral perfusion.  Resp:  Normal effort.  Abd:  Soft and nontender.  No distention.  Other:  Full range of motion right shoulder.  No deformity.   ED Results / Procedures / Treatments   Labs (all labs ordered are listed, but only abnormal results are displayed) Labs Reviewed  CBC WITH DIFFERENTIAL/PLATELET - Abnormal; Notable for the following components:      Result Value   RBC 5.32 (*)    HCT 46.3 (*)    All other components within normal limits  HCG, QUANTITATIVE, PREGNANCY - Abnormal; Notable for the following components:   hCG, Beta Chain, Quant, S 200,230 (*)    All other components within normal limits  URINALYSIS, ROUTINE W REFLEX MICROSCOPIC - Abnormal; Notable for the following components:   Color, Urine YELLOW (*)    APPearance HAZY (*)    All  other components within normal limits  COMPREHENSIVE METABOLIC PANEL     EKG     RADIOLOGY  I dependently viewed and interpreted the images for the studies below:  US OB: There is a live IUP with normal FHR  Chest x-ray: No air under the diaphragm XR R shoulder: No acute fracture   PROCEDURES:  Critical Care performed: No  Procedures   MEDICATIONS ORDERED IN ED: Medications  ondansetron (ZOFRAN-ODT) disintegrating tablet 4 mg (4 mg Oral Given 09/27/22 1344)     IMPRESSION / MDM / ASSESSMENT AND PLAN / ED COURSE  I reviewed the triage vital signs and the nursing notes.  26 year old female G2 P1 at 9 weeks presents with lower abdominal crampy pain as well as some atraumatic right shoulder pain.  Physical exam is unremarkable.  Heart rate is borderline elevated.  The abdomen is soft with no significant tenderness.  Differential diagnosis includes, but is not limited to, round ligament pain or other benign cause, fibroid, cyst, pregnancy complication.  Although the  shoulder pain in connection with abdominal pain concerns me for intra-abdominal free air or free fluid and resulting diaphragmatic irritation, the patient is very well-appearing, and her abdomen is soft with no focal tenderness and no peritoneal signs.  We will obtain ultrasound, x-ray of the shoulder and chest, labs, and reassess.  Patient's presentation is most consistent with acute complicated illness / injury requiring diagnostic workup.   ----------------------------------------- 4:13 PM on 09/27/2022 -----------------------------------------  Ultrasound shows IUP with no acute findings.  There is no leukocytosis and the blood chemistry is normal.  Urinalysis is negative.  X-ray shows no acute abnormalities.  On reassessment, the patient appeared comfortable and did not require any pain medication in the ED.  I counseled her on the results of the work-up.  She will follow-up with [facility].  Return  precautions given, and she expressed understanding.   FINAL CLINICAL IMPRESSION(S) / ED DIAGNOSES   Final diagnoses:  Abdominal pain during pregnancy in first trimester     Rx / DC Orders   ED Discharge Orders          Ordered    ondansetron (ZOFRAN-ODT) 4 MG disintegrating tablet  Every 8 hours PRN        09/27/22 1425             Note:  This document was prepared using Dragon voice recognition software and may include unintentional dictation errors.    Arta Silence, MD 09/27/22 1615

## 2022-10-03 ENCOUNTER — Other Ambulatory Visit: Payer: Medicaid Other

## 2022-10-03 DIAGNOSIS — Z369 Encounter for antenatal screening, unspecified: Secondary | ICD-10-CM

## 2022-10-03 DIAGNOSIS — Z3480 Encounter for supervision of other normal pregnancy, unspecified trimester: Secondary | ICD-10-CM

## 2022-10-04 ENCOUNTER — Telehealth: Payer: Self-pay | Admitting: Nurse Practitioner

## 2022-10-04 LAB — CBC/D/PLT+RPR+RH+ABO+RUBIGG...
Antibody Screen: NEGATIVE
Basophils Absolute: 0 10*3/uL (ref 0.0–0.2)
Basos: 0 %
EOS (ABSOLUTE): 0.1 10*3/uL (ref 0.0–0.4)
Eos: 2 %
HCV Ab: NONREACTIVE
HIV Screen 4th Generation wRfx: NONREACTIVE
Hematocrit: 43 % (ref 34.0–46.6)
Hemoglobin: 13.4 g/dL (ref 11.1–15.9)
Hepatitis B Surface Ag: NEGATIVE
Immature Grans (Abs): 0 10*3/uL (ref 0.0–0.1)
Immature Granulocytes: 0 %
Lymphocytes Absolute: 1.9 10*3/uL (ref 0.7–3.1)
Lymphs: 20 %
MCH: 27 pg (ref 26.6–33.0)
MCHC: 31.2 g/dL — ABNORMAL LOW (ref 31.5–35.7)
MCV: 87 fL (ref 79–97)
Monocytes Absolute: 0.7 10*3/uL (ref 0.1–0.9)
Monocytes: 8 %
Neutrophils Absolute: 6.5 10*3/uL (ref 1.4–7.0)
Neutrophils: 70 %
Platelets: 280 10*3/uL (ref 150–450)
RBC: 4.96 x10E6/uL (ref 3.77–5.28)
RDW: 12.2 % (ref 11.7–15.4)
RPR Ser Ql: NONREACTIVE
Rh Factor: POSITIVE
Rubella Antibodies, IGG: 3.56 index (ref >0.99)
Varicella zoster IgG: 1203 index (ref >165)
WBC: 9.2 10*3/uL (ref 3.4–10.8)

## 2022-10-04 LAB — HCV INTERPRETATION

## 2022-10-04 NOTE — Telephone Encounter (Signed)
[facility] is OON with patient's insurance. Referral redirected to [facility] via [employer]

## 2022-10-07 ENCOUNTER — Other Ambulatory Visit: Payer: Self-pay

## 2022-10-07 DIAGNOSIS — O219 Vomiting of pregnancy, unspecified: Secondary | ICD-10-CM

## 2022-10-07 MED ORDER — ONDANSETRON 4 MG PO TBDP: 4.0000 mg | ORAL_TABLET | Freq: Four times a day (QID) | ORAL | 2 refills | Status: DC | PRN

## 2022-10-10 ENCOUNTER — Ambulatory Visit (INDEPENDENT_AMBULATORY_CARE_PROVIDER_SITE_OTHER): Payer: Medicaid Other

## 2022-10-10 ENCOUNTER — Other Ambulatory Visit: Payer: Self-pay | Admitting: Licensed Practical Nurse

## 2022-10-10 DIAGNOSIS — Z3480 Encounter for supervision of other normal pregnancy, unspecified trimester: Secondary | ICD-10-CM

## 2022-10-10 DIAGNOSIS — Z369 Encounter for antenatal screening, unspecified: Secondary | ICD-10-CM

## 2022-10-10 DIAGNOSIS — Z3687 Encounter for antenatal screening for uncertain dates: Secondary | ICD-10-CM | POA: Diagnosis not present

## 2022-10-14 ENCOUNTER — Other Ambulatory Visit (HOSPITAL_COMMUNITY)
Admission: RE | Admit: 2022-10-14 | Discharge: 2022-10-14 | Disposition: A | Discharge status: Home / Self Care | Payer: Medicaid Other | Source: Ambulatory Visit | Attending: Licensed Practical Nurse | Admitting: Licensed Practical Nurse

## 2022-10-14 ENCOUNTER — Ambulatory Visit (INDEPENDENT_AMBULATORY_CARE_PROVIDER_SITE_OTHER): Payer: Medicaid Other | Admitting: Licensed Practical Nurse

## 2022-10-14 VITALS — BP 125/68 | HR 91 | Wt 204.0 lb

## 2022-10-14 DIAGNOSIS — O0990 Supervision of high risk pregnancy, unspecified, unspecified trimester: Secondary | ICD-10-CM | POA: Insufficient documentation

## 2022-10-14 DIAGNOSIS — Z3A12 12 weeks gestation of pregnancy: Secondary | ICD-10-CM

## 2022-10-14 DIAGNOSIS — Z113 Encounter for screening for infections with a predominantly sexual mode of transmission: Secondary | ICD-10-CM | POA: Diagnosis present

## 2022-10-14 DIAGNOSIS — I7300 Raynaud's syndrome without gangrene: Secondary | ICD-10-CM

## 2022-10-14 DIAGNOSIS — O09291 Supervision of pregnancy with other poor reproductive or obstetric history, first trimester: Secondary | ICD-10-CM

## 2022-10-14 DIAGNOSIS — O34219 Maternal care for unspecified type scar from previous cesarean delivery: Secondary | ICD-10-CM

## 2022-10-14 DIAGNOSIS — Z3A11 11 weeks gestation of pregnancy: Secondary | ICD-10-CM

## 2022-10-14 DIAGNOSIS — O99411 Diseases of the circulatory system complicating pregnancy, first trimester: Secondary | ICD-10-CM

## 2022-10-14 DIAGNOSIS — Z0283 Encounter for blood-alcohol and blood-drug test: Secondary | ICD-10-CM

## 2022-10-14 LAB — POCT URINALYSIS DIPSTICK
Blood, UA: NEGATIVE
Glucose, UA: NEGATIVE
Leukocytes, UA: NEGATIVE
Protein, UA: NEGATIVE
Spec Grav, UA: 1.01 (ref 1.010–1.025)
Urobilinogen, UA: 1 E.U./dL
pH, UA: 6 (ref 5.0–8.0)

## 2022-10-15 LAB — CERVICOVAGINAL ANCILLARY ONLY
Chlamydia: NEGATIVE
Comment: NEGATIVE
Comment: NORMAL
Neisseria Gonorrhea: NEGATIVE

## 2022-10-15 LAB — URINE DRUG PANEL 7
Amphetamines, Urine: NEGATIVE ng/mL
Barbiturate Quant, Ur: NEGATIVE ng/mL
Benzodiazepine Quant, Ur: NEGATIVE ng/mL
Cannabinoid Quant, Ur: NEGATIVE ng/mL
Cocaine (Metab.): NEGATIVE ng/mL
Opiate Quant, Ur: NEGATIVE ng/mL
PCP Quant, Ur: NEGATIVE ng/mL

## 2022-10-16 LAB — URINE CULTURE: Organism ID, Bacteria: NO GROWTH

## 2022-10-21 ENCOUNTER — Other Ambulatory Visit: Payer: Self-pay | Admitting: Advanced Practice Midwife

## 2022-10-21 ENCOUNTER — Encounter (INDEPENDENT_AMBULATORY_CARE_PROVIDER_SITE_OTHER): Payer: Self-pay

## 2022-10-21 DIAGNOSIS — O219 Vomiting of pregnancy, unspecified: Secondary | ICD-10-CM

## 2022-10-21 MED ORDER — ONDANSETRON 4 MG PO TBDP: 4.0000 mg | ORAL_TABLET | Freq: Four times a day (QID) | ORAL | 2 refills | Status: DC | PRN

## 2022-10-21 NOTE — Progress Notes (Signed)
Refill zofran? 

## 2022-10-23 NOTE — Progress Notes (Signed)
Routine Prenatal Care Visit  Subjective  Kathryn Estrada is a 26 y.o. G2P1001 at 51w6dbeing seen today for ongoing prenatal care.  She is currently monitored for the following issues for this high-risk pregnancy and has Raynaud's phenomenon without gangrene; Prolonged capillary refill time; Bilateral cold feet; Other fatigue; Dysuria; Abnormal thyroid blood test; BMI 29.0-29.9,adult; Tobacco use disorder; B12 deficiency; Breech presentation, no version; Complete breech presentation; [redacted] weeks gestation of pregnancy; Cesarean delivery delivered; Postpartum care and examination of lactating mother; and Supervision of other normal pregnancy, antepartum on their problem list.  ----------------------------------------------------------------------------------- Patient reports  cramping , nausea-taking Zofran, unison and B 6 did not help, constipation  -This was a planned pregnancy -Her partner's name is TDorothea Ogle feels safe -Hx c/s for breech, was found to be vertex on day of scheduled c/s opted for c/s instead of labor-undecided about RLTCS or VBAC  -Attempted to breastfeed first child, never had a good latch, infant had lip and tongue tie, would like to try to breastfeed again (per Rita's notes she desires to formula feed dt reynaud's) -had some PPD related to not being able to breastfeed  Last pap 05/2020 NILM  Contractions: Not present. Vag. Bleeding: None.  Movement: Present. Leaking Fluid denies.  ----------------------------------------------------------------------------------- The following portions of the patient's history were reviewed and updated as appropriate: allergies, current medications, past family history, past medical history, past social history, past surgical history and problem list. Problem list updated.  Objective  Blood pressure 125/68, pulse 91, weight 204 lb (92.5 kg), last menstrual period 07/25/2022, currently breastfeeding. Pregravid weight 200 lb (90.7 kg) Total Weight Gain  4 lb (1.814 kg) Urinalysis: Urine Protein    Urine Glucose    Fetal Status: Fetal Heart Rate (bpm): 150   Movement: Present    Thyroid not enlarged  Breasts: soft, no masses or redness, nipples intact  General:  Alert, oriented and cooperative. Patient is in no acute distress.  Skin: Skin is warm and dry. No rash noted.   Cardiovascular: Normal heart rate noted  Respiratory: Normal respiratory effort, no problems with respiration noted, CTAB  Abdomen: Soft, gravid, appropriate for gestational age. Pain/Pressure: Present     Pelvic:  Cervix pink, visually closed no lesions no discharge present      Extremities: Normal range of motion.     Mental Status: Normal mood and affect. Normal behavior. Normal judgment and thought content.   Assessment   26y.o. G2P1001 at 178w6dy  05/01/2023, by Last Menstrual Period presenting for routine prenatal visit  Plan   second Problems (from 09/18/22 to present)     Problem Noted Resolved   Supervision of other normal pregnancy, antepartum 09/18/2022 by JoCleophas DunkerCMA No   Overview Signed 09/18/2022 10:48 AM by JoCleophas DunkerCMSweet Water [place] Provider  Office Location  [facility] Dating  05/01/2023, by Last Menstrual Period  Language  English Anatomy [place]  Flu Vaccine  offer Genetic Screen  NIPS:   TDaP vaccine   offer Hgb A1C or  GTT Early : Third trimester :   Covid declined   LAB RESULTS   Rhogam     Blood Type     Feeding Plan Formula d/t Raenaud''s Antibody    Contraception none Rubella    Circumcision yes RPR     Pediatrician  [facility]     Support Person [name]    Prenatal Classes no Varicella     GBS  (For PCN  allergy, check sensitivities)   BTL Consent  Hep C     VBAC Consent  Pap Diagnosis  Date Value Ref Range Status  06/12/2020      - Negative for intraepithelial lesion or malignancy (NILM)      Hgb Electro      CF      SMA                    general obstetric precautions including but not  limited to vaginal bleeding, contractions, leaking of fluid and fetal movement were reviewed in detail with the patient. Please refer to After Visit Summary for other counseling recommendations.   RTC in 4 weeks   Roberto Scales, [facility]  10/23/22  10:38 PM

## 2022-11-07 ENCOUNTER — Other Ambulatory Visit: Payer: Self-pay

## 2022-11-07 DIAGNOSIS — O219 Vomiting of pregnancy, unspecified: Secondary | ICD-10-CM

## 2022-11-07 MED ORDER — ONDANSETRON 4 MG PO TBDP: 4.0000 mg | ORAL_TABLET | Freq: Four times a day (QID) | ORAL | 2 refills | Status: DC | PRN

## 2022-11-08 ENCOUNTER — Telehealth: Payer: Self-pay

## 2022-11-08 NOTE — Telephone Encounter (Signed)
Patients mother calling in to state patient has been having headaches. She states this just started this morning. Advised patient to take Tylenol, okay to take extra strength, ASA (no Excedrin), use a cold compress and drink a carbonated beverage. Patients mother states understanding and that she will pass on the information.

## 2022-11-11 ENCOUNTER — Other Ambulatory Visit: Payer: Self-pay | Admitting: Licensed Practical Nurse

## 2022-11-11 ENCOUNTER — Other Ambulatory Visit: Payer: Medicaid Other

## 2022-11-11 ENCOUNTER — Ambulatory Visit (INDEPENDENT_AMBULATORY_CARE_PROVIDER_SITE_OTHER): Payer: Medicaid Other | Admitting: Obstetrics

## 2022-11-11 VITALS — BP 129/82 | Wt 205.0 lb

## 2022-11-11 DIAGNOSIS — Z3482 Encounter for supervision of other normal pregnancy, second trimester: Secondary | ICD-10-CM

## 2022-11-11 DIAGNOSIS — Z3480 Encounter for supervision of other normal pregnancy, unspecified trimester: Secondary | ICD-10-CM

## 2022-11-11 DIAGNOSIS — Z3A15 15 weeks gestation of pregnancy: Secondary | ICD-10-CM

## 2022-11-11 DIAGNOSIS — Z131 Encounter for screening for diabetes mellitus: Secondary | ICD-10-CM

## 2022-11-11 LAB — POCT URINALYSIS DIPSTICK OB
Bilirubin, UA: NEGATIVE
Glucose, UA: NEGATIVE
Leukocytes, UA: NEGATIVE
Nitrite, UA: NEGATIVE
POC,PROTEIN,UA: NEGATIVE
Spec Grav, UA: 1.01 (ref 1.010–1.025)
Urobilinogen, UA: 0.2 E.U./dL
pH, UA: 7 (ref 5.0–8.0)

## 2022-11-11 NOTE — Progress Notes (Signed)
Kathryn Estrada at [redacted]w[redacted]d Feeling some flutters. [name] had a sinus infection and was put on amoxicillin on Saturday. Not much improvement yet. Recommend sinus rinses. Early glucola today. [name] Discussed risks/benefits of each. Anatomy scan scheduled for 12/4 - will try to reschedule for after 18 weeks. RTC in 4 weeks.  MLloyd Huger CNM

## 2022-11-11 NOTE — Progress Notes (Signed)
Order placed for early 1 hour [place], [facility]  11/11/22  9:08 AM

## 2022-11-12 ENCOUNTER — Emergency Department
Admission: EM | Admit: 2022-11-12 | Discharge: 2022-11-12 | Disposition: A | Discharge status: Home / Self Care | Payer: Medicaid Other | Attending: Emergency Medicine | Admitting: Emergency Medicine

## 2022-11-12 ENCOUNTER — Other Ambulatory Visit: Payer: Self-pay

## 2022-11-12 ENCOUNTER — Encounter: Payer: Self-pay | Admitting: Emergency Medicine

## 2022-11-12 DIAGNOSIS — J0100 Acute maxillary sinusitis, unspecified: Secondary | ICD-10-CM | POA: Diagnosis not present

## 2022-11-12 DIAGNOSIS — Z3A16 16 weeks gestation of pregnancy: Secondary | ICD-10-CM | POA: Diagnosis not present

## 2022-11-12 DIAGNOSIS — O99512 Diseases of the respiratory system complicating pregnancy, second trimester: Secondary | ICD-10-CM | POA: Insufficient documentation

## 2022-11-12 DIAGNOSIS — O26892 Other specified pregnancy related conditions, second trimester: Secondary | ICD-10-CM | POA: Diagnosis present

## 2022-11-12 DIAGNOSIS — J320 Chronic maxillary sinusitis: Secondary | ICD-10-CM

## 2022-11-12 LAB — CBC WITH DIFFERENTIAL/PLATELET
Abs Immature Granulocytes: 0.03 10*3/uL (ref 0.00–0.07)
Basophils Absolute: 0 10*3/uL (ref 0.0–0.1)
Basophils Relative: 0 %
Eosinophils Absolute: 0.1 10*3/uL (ref 0.0–0.5)
Eosinophils Relative: 1 %
HCT: 38.8 % (ref 36.0–46.0)
Hemoglobin: 12.6 g/dL (ref 12.0–15.0)
Immature Granulocytes: 0 %
Lymphocytes Relative: 14 %
Lymphs Abs: 1.5 10*3/uL (ref 0.7–4.0)
MCH: 27.5 pg (ref 26.0–34.0)
MCHC: 32.5 g/dL (ref 30.0–36.0)
MCV: 84.7 fL (ref 80.0–100.0)
Monocytes Absolute: 0.6 10*3/uL (ref 0.1–1.0)
Monocytes Relative: 6 %
Neutro Abs: 8.8 10*3/uL — ABNORMAL HIGH (ref 1.7–7.7)
Neutrophils Relative %: 79 %
Platelets: 292 10*3/uL (ref 150–400)
RBC: 4.58 MIL/uL (ref 3.87–5.11)
RDW: 12.7 % (ref 11.5–15.5)
WBC: 11.1 10*3/uL — ABNORMAL HIGH (ref 4.0–10.5)
nRBC: 0 % (ref 0.0–0.2)

## 2022-11-12 LAB — BASIC METABOLIC PANEL
Anion gap: 8 (ref 5–15)
BUN: 5 mg/dL — ABNORMAL LOW (ref 6–20)
CO2: 22 mmol/L (ref 22–32)
Calcium: 9.2 mg/dL (ref 8.9–10.3)
Chloride: 106 mmol/L (ref 98–111)
Creatinine, Ser: 0.56 mg/dL (ref 0.44–1.00)
GFR, Estimated: >60 mL/min (ref >60)
Glucose, Bld: 77 mg/dL (ref 70–99)
Potassium: 4 mmol/L (ref 3.5–5.1)
Sodium: 136 mmol/L (ref 135–145)

## 2022-11-12 LAB — GLUCOSE, 1 HOUR GESTATIONAL: Gestational Diabetes Screen: 102 mg/dL (ref 70–139)

## 2022-11-12 MED ORDER — FLUTICASONE PROPIONATE 50 MCG/ACT NA SUSP: 2.0000 | Freq: Every day | NASAL | 0 refills | Status: DC

## 2022-11-12 MED ORDER — AMOXICILLIN-POT CLAVULANATE 875-125 MG PO TABS: 1.0000 | ORAL_TABLET | Freq: Two times a day (BID) | ORAL | 0 refills | Status: DC

## 2022-11-12 NOTE — ED Provider Notes (Signed)
[facility] Provider Note    Event Date/Time   First MD Initiated Contact with Patient 11/12/22 [phone]     (approximate)   History   Facial Pain   HPI  Lauri Purdum is a 26 y.o. female presents to the ED with complaint of left facial area pain.  Patient was seen at urgent care and treated for a sinusitis.  She was placed on antibiotics and told that she would have relief in 24 hours.  Patient is unaware of any fever and denies chills.  Currently she is [redacted] weeks pregnant.  Patient has been taking Augmentin 875 twice daily for approximately 3 to 4 days.  She complains of pressure to the left maxillary area.  Patient is not aware of any fever and denies chills.     Physical Exam   Triage Vital Signs: ED Triage Vitals [11/12/22 0946]  Enc Vitals Group     BP 134/79     Pulse Rate (!) 122     Resp 18     Temp 98.2 F (36.8 C)     Temp Source Oral     SpO2 100 %     Weight 205 lb (93 kg)     Height '5\' 5"'$  (1.651 m)     Head Circumference      Peak Flow      Pain Score 6     Pain Loc      Pain Edu?      Excl. in [place]?     Most recent vital signs: Vitals:   11/12/22 0946  BP: 134/79  Pulse: (!) 122  Resp: 18  Temp: 98.2 F (36.8 C)  SpO2: 100%     General: Awake, no distress.  CV:  Good peripheral perfusion.  Resp:  Normal effort.  Abd:  No distention.  Other:  No facial edema is noted.  Right EAC and TM are clear.  Left EAC is clear, left tympanic membrane is dull but no erythema or injection is noted.  Posterior pharynx without erythema or exudate.  Uvula is midline.  No dental abscess noted.   ED Results / Procedures / Treatments   Labs (all labs ordered are listed, but only abnormal results are displayed) Labs Reviewed  CBC WITH DIFFERENTIAL/PLATELET - Abnormal; Notable for the following components:      Result Value   WBC 11.1 (*)    Neutro Abs 8.8 (*)    All other components within normal limits  BASIC METABOLIC PANEL -  Abnormal; Notable for the following components:   BUN 5 (*)    All other components within normal limits      PROCEDURES:  Critical Care performed:   Procedures   MEDICATIONS ORDERED IN ED: Medications - No data to display   IMPRESSION / MDM / [place] / ED COURSE  I reviewed the triage vital signs and the nursing notes.   Differential diagnosis includes, but is not limited to, acute sinusitis, dental abscess, otitis media, otitis externa.  26 year old female presents to the ED with complaint of left maxillary sinus pressure and pain who is [redacted] weeks pregnant.  Patient was seen over the weekend at an urgent care and placed on Augmentin and was told that she would have relief in 24 hours.  Patient continues to have left maxillary sinus pressure.  Lab work was reassuring and patient was made aware.  A prescription for Flonase nasal spray to add steroids and reduce some of the  swelling which may help with her pressure and pain.  She is to continue taking Tylenol as needed and use warm moist compresses to her face which may provide some relief.  Patient is encouraged to follow-up with Dr. Kathyrn Sheriff who is on-call for [facility] if any continued problems.      Patient's presentation is most consistent with acute complicated illness / injury requiring diagnostic workup.  FINAL CLINICAL IMPRESSION(S) / ED DIAGNOSES   Final diagnoses:  Left maxillary sinusitis     Rx / DC Orders   ED Discharge Orders          Ordered    amoxicillin-clavulanate (AUGMENTIN) 875-125 MG tablet  2 times daily        11/12/22 1221    fluticasone (FLONASE) 50 MCG/ACT nasal spray  Daily        11/12/22 1221             Note:  This document was prepared using Dragon voice recognition software and may include unintentional dictation errors.   Johnn Hai, PA-C 11/12/22 1229    Naaman Plummer, MD 11/12/22 1515

## 2022-11-12 NOTE — ED Triage Notes (Signed)
Patient arrives ambulatory by POV c/o being sick x 2 weeks. States she was seen Saturday and diagnosed with sinus infection and ear infection. Started on amoxicillin. 1 week ago started having facial pain. Patient states she was told she would feel better within 24 hours but pain is not improving.  Patient is [redacted] weeks pregnant.

## 2022-11-12 NOTE — Discharge Instructions (Signed)
Follow-up with Dr. Kathyrn Sheriff who is the ENT on-call.  His phone number and office information is listed on your discharge papers.  Warm moist compresses to the left side of your face may help reduce some of your pain.  Begin using the steroid nasal spray which is 2 sprays to each side of your nose 1 time a day.  Continue taking Tylenol if needed for pain.  A prescription for 2 additional days of antibiotics was sent to the pharmacy so that you have a complete 10-day course.  Increase fluids to stay hydrated.

## 2022-11-18 ENCOUNTER — Telehealth: Payer: Self-pay

## 2022-11-18 NOTE — Telephone Encounter (Signed)
Patient calling in to state she believes she has a yeast infection. She recently stopped an antibiotic and became symptomatic. Advised Monistat OTC 7 day. Instructed patient to call back if symptoms do not resolve.

## 2022-11-25 ENCOUNTER — Other Ambulatory Visit: Payer: Medicaid Other

## 2022-12-02 ENCOUNTER — Ambulatory Visit (INDEPENDENT_AMBULATORY_CARE_PROVIDER_SITE_OTHER): Payer: Medicaid Other

## 2022-12-02 DIAGNOSIS — Z3A18 18 weeks gestation of pregnancy: Secondary | ICD-10-CM

## 2022-12-02 DIAGNOSIS — Z3A15 15 weeks gestation of pregnancy: Secondary | ICD-10-CM

## 2022-12-02 DIAGNOSIS — Z3689 Encounter for other specified antenatal screening: Secondary | ICD-10-CM

## 2022-12-10 ENCOUNTER — Ambulatory Visit (INDEPENDENT_AMBULATORY_CARE_PROVIDER_SITE_OTHER): Payer: Medicaid Other | Admitting: Obstetrics & Gynecology

## 2022-12-10 VITALS — BP 120/80 | Wt 206.0 lb

## 2022-12-10 DIAGNOSIS — Z3480 Encounter for supervision of other normal pregnancy, unspecified trimester: Secondary | ICD-10-CM

## 2022-12-10 DIAGNOSIS — Z3482 Encounter for supervision of other normal pregnancy, second trimester: Secondary | ICD-10-CM

## 2022-12-10 DIAGNOSIS — O34219 Maternal care for unspecified type scar from previous cesarean delivery: Secondary | ICD-10-CM | POA: Insufficient documentation

## 2022-12-10 DIAGNOSIS — L659 Nonscarring hair loss, unspecified: Secondary | ICD-10-CM

## 2022-12-10 DIAGNOSIS — Z3A19 19 weeks gestation of pregnancy: Secondary | ICD-10-CM

## 2022-12-10 DIAGNOSIS — R7989 Other specified abnormal findings of blood chemistry: Secondary | ICD-10-CM

## 2022-12-10 LAB — POCT URINALYSIS DIPSTICK OB
Bilirubin, UA: NEGATIVE
Blood, UA: NEGATIVE
Glucose, UA: NEGATIVE
Ketones, UA: NEGATIVE
Leukocytes, UA: NEGATIVE
Nitrite, UA: NEGATIVE
POC,PROTEIN,UA: NEGATIVE
Spec Grav, UA: 1.015 (ref 1.010–1.025)
Urobilinogen, UA: 1 E.U./dL
pH, UA: 5.5 (ref 5.0–8.0)

## 2022-12-10 NOTE — Progress Notes (Signed)
   PRENATAL VISIT NOTE  Subjective:  Kathryn Estrada is a 26 y.o. G2P1001 at 29w5dbeing seen today for ongoing prenatal care.  She is currently monitored for the following issues for this low-risk pregnancy and has Raynaud's phenomenon without gangrene; Prolonged capillary refill time; Bilateral cold feet; Other fatigue; Dysuria; Abnormal thyroid blood test; BMI 29.0-29.9,adult; Tobacco use disorder; B12 deficiency; Supervision of other normal pregnancy, antepartum; and Previous cesarean delivery affecting pregnancy on their problem list.  Patient reports  hair thinning .  Contractions: Irritability. Vag. Bleeding: None.  Movement: Present. Denies leaking of fluid.   The following portions of the patient's history were reviewed and updated as appropriate: allergies, current medications, past family history, past medical history, past social history, past surgical history and problem list.   Objective:   Vitals:   12/10/22 1023  BP: 120/80  Weight: 206 lb (93.4 kg)    Fetal Status:     Movement: Present     General:  Alert, oriented and cooperative. Patient is in no acute distress.  Skin: Skin is warm and dry. No rash noted.   Cardiovascular: Normal heart rate noted  Respiratory: Normal respiratory effort, no problems with respiration noted  Abdomen: Soft, gravid, appropriate for gestational age.  Pain/Pressure: Present     Pelvic: Cervical exam deferred        Extremities: Normal range of motion.     Mental Status: Normal mood and affect. Normal behavior. Normal judgment and thought content.   FH- 20  FHR- 150s  Assessment and Plan:  Pregnancy: G2P1001 at 113w5d. Supervision of other normal pregnancy, antepartum   2. Previous cesarean delivery affecting pregnancy - she is still considering TOLAC versus RCS  3. Abnormal thyroid blood test  - TSH + free T4  4. Hair loss  - TSH + free T4  Preterm labor symptoms and general obstetric precautions including but not limited  to vaginal bleeding, contractions, leaking of fluid and fetal movement were reviewed in detail with the patient. Please refer to After Visit Summary for other counseling recommendations.   Return in about 4 weeks (around 01/07/2023).  No future appointments.  MyEmily FilbertMD

## 2022-12-11 LAB — TSH+FREE T4
Free T4: 1.03 ng/dL (ref 0.82–1.77)
TSH: 1.36 u[IU]/mL (ref 0.450–4.500)

## 2022-12-23 NOTE — L&D Delivery Note (Signed)
Delivery Summary for Kathryn Estrada  Labor Events:   Preterm labor: No data found  Rupture date: No data found  Rupture time: No data found  Rupture type: No data found  Fluid Color: No data found  Induction: No data found  Augmentation: No data found  Complications: No data found  Cervical ripening: No data found No data found   No data found     Delivery:   Episiotomy: No data found  Lacerations: No data found  Repair suture: No data found  Repair # of packets: No data found  Blood loss (ml): 577   Information for the patient's newborn:  Cristy, Colmenares [[id number]]   Delivery 04/24/2023 9:35 AM by  C-Section, Low Transverse Sex:  female Gestational Age: [redacted]w[redacted]d Delivery Clinician:   Living?:         APGARS  One minute Five minutes Ten minutes  Skin color:        Heart rate:        Grimace:        Muscle tone:        Breathing:        Totals: 9  9      Presentation/position:      Resuscitation:   Cord information:    Disposition of cord blood:     Blood gases sent?  Complications:   Placenta: Delivered:       appearance Newborn Measurements: Weight: 7 lb 4.8 oz (3310 g)  Height: 20.5"  Head circumference:    Chest circumference:    Other providers:    Additional  information: Forceps:   Vacuum:   Breech:   Observed anomalies       Please see Dr. Oretha Milch operative note for details of C-section delivery.     Hildred Laser, MD [facility] at [facility]

## 2022-12-29 ENCOUNTER — Encounter: Payer: Self-pay | Admitting: Licensed Practical Nurse

## 2022-12-30 ENCOUNTER — Other Ambulatory Visit: Payer: Self-pay

## 2022-12-30 DIAGNOSIS — O219 Vomiting of pregnancy, unspecified: Secondary | ICD-10-CM

## 2022-12-30 MED ORDER — ONDANSETRON 4 MG PO TBDP: 4.0000 mg | ORAL_TABLET | Freq: Four times a day (QID) | ORAL | 2 refills | Status: DC | PRN

## 2023-01-08 ENCOUNTER — Ambulatory Visit (INDEPENDENT_AMBULATORY_CARE_PROVIDER_SITE_OTHER): Payer: Medicaid Other | Admitting: Certified Nurse Midwife

## 2023-01-08 VITALS — BP 109/75 | HR 102 | Wt 210.5 lb

## 2023-01-08 DIAGNOSIS — Z3A23 23 weeks gestation of pregnancy: Secondary | ICD-10-CM

## 2023-01-08 DIAGNOSIS — H547 Unspecified visual loss: Secondary | ICD-10-CM

## 2023-01-08 DIAGNOSIS — R519 Headache, unspecified: Secondary | ICD-10-CM

## 2023-01-08 DIAGNOSIS — H53133 Sudden visual loss, bilateral: Secondary | ICD-10-CM

## 2023-01-08 DIAGNOSIS — O99891 Other specified diseases and conditions complicating pregnancy: Secondary | ICD-10-CM

## 2023-01-08 DIAGNOSIS — G4489 Other headache syndrome: Secondary | ICD-10-CM

## 2023-01-08 DIAGNOSIS — Z8639 Personal history of other endocrine, nutritional and metabolic disease: Secondary | ICD-10-CM

## 2023-01-08 LAB — POCT URINALYSIS DIPSTICK OB
Bilirubin, UA: NEGATIVE
Blood, UA: NEGATIVE
Glucose, UA: NEGATIVE
Ketones, UA: NEGATIVE
Leukocytes, UA: NEGATIVE
Nitrite, UA: NEGATIVE
POC,PROTEIN,UA: NEGATIVE
Spec Grav, UA: >=1.03 — AB (ref 1.010–1.025)
Urobilinogen, UA: 0.2 E.U./dL
pH, UA: 5 (ref 5.0–8.0)

## 2023-01-08 NOTE — Addendum Note (Signed)
Addended by: Minette Headland on: 01/08/2023 11:50 AM   Modules accepted: Orders

## 2023-01-08 NOTE — Patient Instructions (Signed)
Oral Glucose Tolerance Test During Pregnancy Why am I having this test? The oral glucose tolerance test (OGTT) is done to check how your body processes blood sugar (glucose). This is one of several tests used to diagnose diabetes that develops during pregnancy (gestational diabetes mellitus). Gestational diabetes is a short-term form of diabetes that some women develop while they are pregnant. It usually occurs during the second trimester of pregnancy and goes away after delivery. Testing, or screening, for gestational diabetes usually occurs at weeks 24-28 of pregnancy. You may have the OGTT test after having a 1-hour glucose screening test if the results from that test indicate that you may have gestational diabetes. This test may also be needed if: You have a history of gestational diabetes. There is a history of giving birth to very large babies or of losing pregnancies (having stillbirths). You have signs and symptoms of diabetes, such as: Changes in your eyesight. Tingling or numbness in your hands or feet. Changes in hunger, thirst, and urination, and these are not explained by your pregnancy. What is being tested? This test measures the amount of glucose in your blood at different times during a period of 3 hours. This shows how well your body can process glucose. What kind of sample is taken?  Blood samples are required for this test. They are usually collected by inserting a needle into a blood vessel. How do I prepare for this test? For 3 days before your test, eat normally. Have plenty of carbohydrate-rich foods. Follow instructions from your health care provider about: Eating or drinking restrictions on the day of the test. You may be asked not to eat or drink anything other than water (to fast) starting 8-10 hours before the test. Changing or stopping your regular medicines. Some medicines may interfere with this test. Tell a health care provider about: All medicines you are  taking, including vitamins, herbs, eye drops, creams, and over-the-counter medicines. Any blood disorders you have. Any surgeries you have had. Any medical conditions you have. What happens during the test? First, your blood glucose will be measured. This is referred to as your fasting blood glucose because you fasted before the test. Then, you will drink a glucose solution that contains a certain amount of glucose. Your blood glucose will be measured again 1, 2, and 3 hours after you drink the solution. This test takes about 3 hours to complete. You will need to stay at the testing location during this time. During the testing period: Do not eat or drink anything other than the glucose solution. Do not exercise. Do not use any products that contain nicotine or tobacco, such as cigarettes, e-cigarettes, and chewing tobacco. These can affect your test results. If you need help quitting, ask your health care provider. The testing procedure may vary among health care providers and hospitals. How are the results reported? Your results will be reported as milligrams of glucose per deciliter of blood (mg/dL) or millimoles per liter (mmol/L). There is more than one source for screening and diagnosis reference values used to diagnose gestational diabetes. Your health care provider will compare your results to normal values that were established after testing a large group of people (reference values). Reference values may vary among labs and hospitals. For this test (Carpenter-Coustan), reference values are: Fasting: 95 mg/dL (5.3 mmol/L). 1 hour: 180 mg/dL (10.0 mmol/L). 2 hour: 155 mg/dL (8.6 mmol/L). 3 hour: 140 mg/dL (7.8 mmol/L). What do the results mean? Results below the reference values are   considered normal. If two or more of your blood glucose levels are at or above the reference values, you may be diagnosed with gestational diabetes. If only one level is high, your health care provider may  suggest repeat testing or other tests to confirm a diagnosis. Talk with your health care provider about what your results mean. Questions to ask your health care provider Ask your health care provider, or the department that is doing the test: When will my results be ready? How will I get my results? What are my treatment options? What other tests do I need? What are my next steps? Summary The oral glucose tolerance test (OGTT) is one of several tests used to diagnose diabetes that develops during pregnancy (gestational diabetes mellitus). Gestational diabetes is a short-term form of diabetes that some women develop while they are pregnant. You may have the OGTT test after having a 1-hour glucose screening test if the results from that test show that you may have gestational diabetes. You may also have this test if you have any symptoms or risk factors for this type of diabetes. Talk with your health care provider about what your results mean. This information is not intended to replace advice given to you by your health care provider. Make sure you discuss any questions you have with your health care provider. Document Revised: 07/16/2022 Document Reviewed: 05/18/2020 Elsevier Patient Education  2023 [employer] Inc.  

## 2023-01-08 NOTE — Progress Notes (Signed)
ROB doing well, feeling good movement. PT has concerns regarding episode of loss of vision proceeded by a headache. She states that she had this happen after her last pregnancy . She denies S&S of pre eclampsia. BP normal today. Orders placed for neurology consult. 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 Discussed drawing levels with 28 wks labs she is in agreement. She notes today that she declines TOLAC and would like a repeat c section. Discussed appointment with MD 36 wks to schedule. She verbalizes and agrees. Follow up 4 wks for labs & ROB.   Philip Aspen, CNM

## 2023-01-09 ENCOUNTER — Emergency Department
Admission: EM | Admit: 2023-01-09 | Discharge: 2023-01-09 | Disposition: A | Discharge status: Home / Self Care | Payer: Medicaid Other | Attending: Emergency Medicine | Admitting: Emergency Medicine

## 2023-01-09 ENCOUNTER — Other Ambulatory Visit: Payer: Self-pay

## 2023-01-09 ENCOUNTER — Emergency Department: Payer: Medicaid Other

## 2023-01-09 DIAGNOSIS — H547 Unspecified visual loss: Secondary | ICD-10-CM | POA: Diagnosis not present

## 2023-01-09 DIAGNOSIS — R519 Headache, unspecified: Secondary | ICD-10-CM | POA: Diagnosis present

## 2023-01-09 LAB — URINALYSIS, ROUTINE W REFLEX MICROSCOPIC
Bilirubin Urine: NEGATIVE
Glucose, UA: NEGATIVE mg/dL
Hgb urine dipstick: NEGATIVE
Ketones, ur: NEGATIVE mg/dL
Leukocytes,Ua: NEGATIVE
Nitrite: NEGATIVE
Protein, ur: NEGATIVE mg/dL
Specific Gravity, Urine: 1.006 (ref 1.005–1.030)
pH: 7 (ref 5.0–8.0)

## 2023-01-09 LAB — CBC
HCT: 35.6 % — ABNORMAL LOW (ref 36.0–46.0)
Hemoglobin: 11.5 g/dL — ABNORMAL LOW (ref 12.0–15.0)
MCH: 28.1 pg (ref 26.0–34.0)
MCHC: 32.3 g/dL (ref 30.0–36.0)
MCV: 87 fL (ref 80.0–100.0)
Platelets: 226 10*3/uL (ref 150–400)
RBC: 4.09 MIL/uL (ref 3.87–5.11)
RDW: 13.4 % (ref 11.5–15.5)
WBC: 12.4 10*3/uL — ABNORMAL HIGH (ref 4.0–10.5)
nRBC: 0 % (ref 0.0–0.2)

## 2023-01-09 LAB — BASIC METABOLIC PANEL
Anion gap: 7 (ref 5–15)
BUN: 7 mg/dL (ref 6–20)
CO2: 22 mmol/L (ref 22–32)
Calcium: 8.5 mg/dL — ABNORMAL LOW (ref 8.9–10.3)
Chloride: 106 mmol/L (ref 98–111)
Creatinine, Ser: 0.58 mg/dL (ref 0.44–1.00)
GFR, Estimated: >60 mL/min (ref >60)
Glucose, Bld: 92 mg/dL (ref 70–99)
Potassium: 3.5 mmol/L (ref 3.5–5.1)
Sodium: 135 mmol/L (ref 135–145)

## 2023-01-09 NOTE — ED Notes (Signed)
Visual Acuity- R eye: 20/30, L eye: 20/25, Both 20/30

## 2023-01-09 NOTE — Discharge Instructions (Addendum)
The MRIs of your brain were normal.  You may have something called idiopathic intracranial hypertension.  The only way to diagnose this is with a lumbar puncture.  Please follow-up with neurology if you continue to have vision symptoms.  You should also follow-up with ophthalmology.  If your symptoms are acutely worsening or not resolving you can always return to the emergency department.

## 2023-01-09 NOTE — ED Provider Notes (Signed)
[facility] Provider Note    Event Date/Time   First MD Initiated Contact with Patient 01/09/23 1059     (approximate)   History   Headache   HPI  Kathryn Estrada is a 27 y.o. female currently [redacted] weeks pregnant who presents with headache and blurred vision.  Patient tells me that symptoms for started on Friday.  She was sitting down when she suddenly had binocular blurred vision.  Says it was very difficult to see.  She immediately laid down for a nap and when she woke up the vision had improved but she noticed a tingling pain in the back of her head.  She felt a COVID the rest the day and then on Saturday similar thing happened and again she laid down.  Since that time she has had multiple intermittent episodes where her vision seems to get blurred and it will last for several minutes and then returns back mostly to normal however she does still have some persistent sensation of the vision is not quite normal but has difficulty describing exactly what denies any diplopia.  Does have some tingling in the back of her head that is mild discomfort but no significant headache.  Also feels some tingling in the face bilaterally.  Denies any extremity numbness tingling.  Patient tells me that she had tingling sensation last year and was told she had vitamin deficiencies which seemed improved after spent more time outside but no history of any vision changes.  Denies contacts or glasses use.  She is currently [redacted] weeks pregnant.  Denies any loss of fetal movement or vaginal bleeding or fluid loss.  Denies fevers chills.  No trauma.     Past Medical History:  Diagnosis Date   Complication of anesthesia    needs a good amount of anesthesia to get numb   Raynaud's syndrome without gangrene     Patient Active Problem List   Diagnosis Date Noted   Previous cesarean delivery affecting pregnancy 12/10/2022   Supervision of other normal pregnancy, antepartum 09/18/2022   B12  deficiency 04/16/2020   Tobacco use disorder 03/28/2020   Abnormal thyroid blood test 03/12/2020   BMI 29.0-29.9,adult 03/12/2020   Raynaud's phenomenon without gangrene 01/30/2020   Prolonged capillary refill time 01/30/2020   Bilateral cold feet 01/30/2020   Other fatigue 01/30/2020   Dysuria 01/30/2020     Physical Exam  Triage Vital Signs: ED Triage Vitals  Enc Vitals Group     BP 01/09/23 1036 135/86     Pulse Rate 01/09/23 1036 99     Resp 01/09/23 1036 18     Temp 01/09/23 1036 98 F (36.7 C)     Temp src --      SpO2 01/09/23 1036 100 %     Weight --      Height --      Head Circumference --      Peak Flow --      Pain Score 01/09/23 1035 2     Pain Loc --      Pain Edu? --      Excl. in [place]? --     Most recent vital signs: Vitals:   01/09/23 1215 01/09/23 1400  BP:  119/68  Pulse:  98  Resp: 17 18  Temp:  98 F (36.7 C)  SpO2:  100%     General: Awake, no distress.  CV:  Good peripheral perfusion.  Resp:  Normal effort.  Abd:  No  distention.  Neuro:             Awake, Alert, Oriented x 3  Other:  Aox3, nml speech  PERRL, EOMI, face symmetric, nml tongue movement  5/5 strength in the BL upper and lower extremities  Sensation grossly intact in the BL upper and lower extremities  Finger-nose-finger intact BL    ED Results / Procedures / Treatments  Labs (all labs ordered are listed, but only abnormal results are displayed) Labs Reviewed  BASIC METABOLIC PANEL - Abnormal; Notable for the following components:      Result Value   Calcium 8.5 (*)    All other components within normal limits  CBC - Abnormal; Notable for the following components:   WBC 12.4 (*)    Hemoglobin 11.5 (*)    HCT 35.6 (*)    All other components within normal limits  URINALYSIS, ROUTINE W REFLEX MICROSCOPIC - Abnormal; Notable for the following components:   Color, Urine STRAW (*)    APPearance CLEAR (*)    All other components within normal limits  CBG MONITORING,  ED     EKG  EKG interpretation performed by myself: NSR, nml axis, nml intervals, no acute ischemic changes    RADIOLOGY    PROCEDURES:  Critical Care performed: No  Procedures  The patient is on the cardiac monitor to evaluate for evidence of arrhythmia and/or significant heart rate changes.   MEDICATIONS ORDERED IN ED: Medications - No data to display   IMPRESSION / MDM / [place] / ED COURSE  I reviewed the triage vital signs and the nursing notes.                              Patient's presentation is most consistent with acute presentation with potential threat to life or bodily function.  Differential diagnosis includes, but is not limited to, raised intracranial pressure due to pseudotumor, venous sinus thrombosis, optic neuritis, complex migraine presentation not consistent with preeclampsia    Patient is a 27 year old female who is currently [redacted] weeks pregnant who presents with vision change.  This has been intermittent since Friday.  2 worst episodes occurred on Friday and Saturday resolved after she laid down.  She describes significantly blurred vision on those 2 days but since then has had intermittent periods where her vision gets darker and then seems to improve.  Currently she says her vision is not quite normal but she has difficulty describing exactly what she sees says things are somewhat delayed but she is not having any diplopia.  She does endorse some mild tingling sensation in the back of her head but no real severe headache.  She is not vomiting.  No history of similar vision change.  Does not wear glasses or contacts and this is all binocular.  Patient's vital signs are reassuring she is not hypertensive.  On exam her neurologic exam is intact visual acuity 20/30 in both eyes.  She has normal extraocular movements.  With these transient visual symptoms and a pregnant woman I am concerned about potential cerebral venous sinus thrombosis.  Raise  intracranial pressure from idiopathic intracranial hypertension could certainly also cause some transient vision symptoms.  Spoke with Dr. Curly Shores with neurology to determine which imaging would be best she recommends getting an MRI without MRA and MRV without as well.  If all normal patient may still need a lumbar puncture to evaluate for opening pressure.  Patient does not currently have any pain right now.  Basic labs including CBC and BMP are reassuring.  Of note she is not having any symptoms related to pregnancy including no change in fetal movement.  MRI MRA and MRV are all normal.  Discussed with patient performing lumbar puncture to evaluate and potentially treat IIH.  I had a long discussion about risks of potential permanent vision loss if IIH is not identified and treated promptly patient ultimately did not want to undergo LP today.  She understood the risks.  I advise she follow-up with neurology and can was return to ED if her symptoms seem to be worsening.  FINAL CLINICAL IMPRESSION(S) / ED DIAGNOSES   Final diagnoses:  Vision loss     Rx / DC Orders   ED Discharge Orders     None        Note:  This document was prepared using Dragon voice recognition software and may include unintentional dictation errors.   Rada Hay, MD 01/09/23 1556

## 2023-01-09 NOTE — ED Triage Notes (Signed)
Pt comes with c/o headache, dizziness and blurry vision that comes and goes. Pt is about [redacted] weeks pregnant with 2nd baby. Pt states some neck pain as well.

## 2023-01-09 NOTE — ED Triage Notes (Signed)
First Nurse Note:  C/o dizziness, visual changes, head tingling since Friday.  Patient is 6 months pregnant.    AAOx3.  Skin warm and dry. NAD.  Ambulates with easy and steady gait.  MAE equally and strong.

## 2023-01-14 ENCOUNTER — Telehealth: Payer: Self-pay | Admitting: Nurse Practitioner

## 2023-01-15 NOTE — Telephone Encounter (Signed)
Error

## 2023-02-03 ENCOUNTER — Encounter: Payer: Self-pay | Admitting: Nurse Practitioner

## 2023-02-05 ENCOUNTER — Other Ambulatory Visit: Payer: Self-pay

## 2023-02-05 ENCOUNTER — Encounter: Payer: Self-pay | Admitting: Obstetrics

## 2023-02-05 ENCOUNTER — Other Ambulatory Visit: Payer: Medicaid Other

## 2023-02-05 ENCOUNTER — Ambulatory Visit (INDEPENDENT_AMBULATORY_CARE_PROVIDER_SITE_OTHER): Payer: Medicaid Other | Admitting: Obstetrics

## 2023-02-05 VITALS — BP 106/70 | HR 121 | Wt 214.0 lb

## 2023-02-05 DIAGNOSIS — Z3490 Encounter for supervision of normal pregnancy, unspecified, unspecified trimester: Secondary | ICD-10-CM

## 2023-02-05 DIAGNOSIS — Z3A27 27 weeks gestation of pregnancy: Secondary | ICD-10-CM

## 2023-02-05 DIAGNOSIS — Z3A23 23 weeks gestation of pregnancy: Secondary | ICD-10-CM

## 2023-02-05 DIAGNOSIS — Z3480 Encounter for supervision of other normal pregnancy, unspecified trimester: Secondary | ICD-10-CM

## 2023-02-05 DIAGNOSIS — O34219 Maternal care for unspecified type scar from previous cesarean delivery: Secondary | ICD-10-CM

## 2023-02-05 DIAGNOSIS — Z8639 Personal history of other endocrine, nutritional and metabolic disease: Secondary | ICD-10-CM

## 2023-02-05 DIAGNOSIS — Z3A28 28 weeks gestation of pregnancy: Secondary | ICD-10-CM

## 2023-02-05 LAB — POCT URINALYSIS DIPSTICK OB
Bilirubin, UA: NEGATIVE
Blood, UA: NEGATIVE
Glucose, UA: NEGATIVE
Ketones, UA: NEGATIVE
Leukocytes, UA: NEGATIVE
Nitrite, UA: NEGATIVE
POC,PROTEIN,UA: NEGATIVE
Spec Grav, UA: 1.015 (ref 1.010–1.025)
Urobilinogen, UA: 0.2 E.U./dL
pH, UA: 7 (ref 5.0–8.0)

## 2023-02-05 NOTE — Progress Notes (Signed)
Routine Prenatal Care Visit  Subjective  Kathryn Estrada is a 26 y.o. G2P1001 at 76w6dbeing seen today for ongoing prenatal care.  She is currently monitored for the following issues for this low-risk pregnancy and has Raynaud's phenomenon without gangrene; Prolonged capillary refill time; Bilateral cold feet; Other fatigue; Dysuria; Abnormal thyroid blood test; BMI 29.0-29.9,adult; Tobacco use disorder; B12 deficiency; Supervision of other normal pregnancy, antepartum; and Previous cesarean delivery affecting pregnancy on their problem list.  ----------------------------------------------------------------------------------- Patient reports no complaints.  Having her 28 week labs today.she is a tad needkle phobic, so her pulse is up. Expresses fears regarding the idea of VBAC. Contractions: Not present. Vag. Bleeding: None.  Movement: Present. Leaking Fluid denies.  ----------------------------------------------------------------------------------- The following portions of the patient's history were reviewed and updated as appropriate: allergies, current medications, past family history, past medical history, past social history, past surgical history and problem list. Problem list updated.  Objective  Blood pressure 106/70, pulse (!) 121, weight 214 lb (97.1 kg), last menstrual period 07/25/2022, currently breastfeeding. Pregravid weight 200 lb (90.7 kg) Total Weight Gain 14 lb (6.35 kg) Urinalysis: Urine Protein Negative  Urine Glucose Negative  Fetal Status:     Movement: Present     General:  Alert, oriented and cooperative. Patient is in no acute distress.  Skin: Skin is warm and dry. No rash noted.   Cardiovascular: Normal heart rate noted  Respiratory: Normal respiratory effort, no problems with respiration noted  Abdomen: Soft, gravid, appropriate for gestational age. Pain/Pressure: Absent     Pelvic:  Cervical exam deferred        Extremities: Normal range of motion.     Mental  Status: Normal mood and affect. Normal behavior. Normal judgment and thought content.   Assessment   27y.o. G2P1001 at 270w6dy  05/01/2023, by Last Menstrual Period presenting for routine prenatal visit  Plan   second Problems (from 09/18/22 to present)     Problem Noted Resolved   Supervision of other normal pregnancy, antepartum 09/18/2022 by JoCleophas DunkerCMSt. Paulo   Overview Addendum 02/05/2023 10:43 AM by ArDrenda FreezeCMOnychataff Provider  Office Location  [facility] Dating  05/01/2023, by Last Menstrual Period  Language  English Anatomy [place] female  Flu Vaccine  declined Genetic Screen  NIPS:   TDaP vaccine  declined Hgb A1C or  GTT Early : Third trimester :   Covid declined   LAB RESULTS   Rhogam  O/Positive/-- (10/12 1023)  Blood Type O/Positive/-- (10/12 1023)   Feeding Plan Formula d/t Raenaud''s Antibody Negative (10/12 1023)  Contraception none Rubella 3.56 (10/12 1023)  Circumcision yes RPR Non Reactive (10/12 1023)   Pediatrician  [facility] HBsAg Negative (10/12 1023)   Support Person [name] HIV Non Reactive (10/12 1023)  Prenatal Classes no Varicella     GBS  (For PCN allergy, check sensitivities)   BTL Consent  Hep C Non Reactive (10/12 1023)   VBAC Consent  Pap Diagnosis  Date Value Ref Range Status  06/12/2020      - Negative for intraepithelial lesion or malignancy (NILM)      Hgb Electro      CF      SMA                   Preterm labor symptoms and general obstetric precautions including but not limited to vaginal bleeding, contractions, leaking of fluid and fetal movement were reviewed in  detail with the patient. Please refer to After Visit Summary for other counseling recommendations.  Her 1hr GTT is drawn with 28 wk labs. She measures larger for dates. Growth scan ordered. Consider monthly scans due to her BMI. No follow-ups on file.  Imagene Riches, CNM  02/05/2023 10:54 AM

## 2023-02-06 ENCOUNTER — Telehealth: Payer: Self-pay

## 2023-02-06 DIAGNOSIS — O219 Vomiting of pregnancy, unspecified: Secondary | ICD-10-CM

## 2023-02-06 LAB — 28 WEEK RH+PANEL
Basophils Absolute: 0 10*3/uL (ref 0.0–0.2)
Basos: 0 %
EOS (ABSOLUTE): 0.1 10*3/uL (ref 0.0–0.4)
Eos: 1 %
Gestational Diabetes Screen: 75 mg/dL (ref 70–139)
HIV Screen 4th Generation wRfx: NONREACTIVE
Hematocrit: 35.3 % (ref 34.0–46.6)
Hemoglobin: 11.4 g/dL (ref 11.1–15.9)
Immature Grans (Abs): 0 10*3/uL (ref 0.0–0.1)
Immature Granulocytes: 0 %
Lymphocytes Absolute: 1.4 10*3/uL (ref 0.7–3.1)
Lymphs: 12 %
MCH: 28.1 pg (ref 26.6–33.0)
MCHC: 32.3 g/dL (ref 31.5–35.7)
MCV: 87 fL (ref 79–97)
Monocytes Absolute: 0.7 10*3/uL (ref 0.1–0.9)
Monocytes: 6 %
Neutrophils Absolute: 8.9 10*3/uL — ABNORMAL HIGH (ref 1.4–7.0)
Neutrophils: 81 %
Platelets: 211 10*3/uL (ref 150–450)
RBC: 4.06 x10E6/uL (ref 3.77–5.28)
RDW: 12.5 % (ref 11.7–15.4)
RPR Ser Ql: NONREACTIVE
WBC: 11.2 10*3/uL — ABNORMAL HIGH (ref 3.4–10.8)

## 2023-02-06 MED ORDER — ONDANSETRON 4 MG PO TBDP: 4.0000 mg | ORAL_TABLET | Freq: Four times a day (QID) | ORAL | 2 refills | Status: DC | PRN

## 2023-02-06 NOTE — Telephone Encounter (Signed)
Pt called triage needing refill on zofran, refill sent and pt aware.

## 2023-02-11 LAB — VITAMIN B6: Vitamin B6: 2.5 ug/L — ABNORMAL LOW (ref 3.4–65.2)

## 2023-02-11 LAB — VITAMIN B12: Vitamin B-12: 173 pg/mL — ABNORMAL LOW (ref 232–1245)

## 2023-02-11 LAB — VITAMIN D 25 HYDROXY (VIT D DEFICIENCY, FRACTURES): Vit D, 25-Hydroxy: 39.7 ng/mL (ref 30.0–100.0)

## 2023-02-15 ENCOUNTER — Other Ambulatory Visit: Payer: Self-pay | Admitting: Certified Nurse Midwife

## 2023-02-15 ENCOUNTER — Encounter: Payer: Self-pay | Admitting: Certified Nurse Midwife

## 2023-02-15 MED ORDER — VITAMIN B-6 25 MG PO TABS: 25.0000 mg | ORAL_TABLET | Freq: Every day | ORAL | 0 refills | Status: AC

## 2023-02-15 MED ORDER — CYANOCOBALAMIN 1000 MCG/ML IJ SOLN: 1000.0000 ug | Freq: Once | INTRAMUSCULAR | 3 refills | Status: AC

## 2023-02-18 ENCOUNTER — Ambulatory Visit
Admission: RE | Admit: 2023-02-18 | Discharge: 2023-02-18 | Disposition: A | Discharge status: Home / Self Care | Payer: Medicaid Other | Source: Ambulatory Visit | Attending: Obstetrics | Admitting: Obstetrics

## 2023-02-18 DIAGNOSIS — Z3A29 29 weeks gestation of pregnancy: Secondary | ICD-10-CM | POA: Insufficient documentation

## 2023-02-18 DIAGNOSIS — Z3490 Encounter for supervision of normal pregnancy, unspecified, unspecified trimester: Secondary | ICD-10-CM | POA: Diagnosis present

## 2023-02-18 DIAGNOSIS — O3663X0 Maternal care for excessive fetal growth, third trimester, not applicable or unspecified: Secondary | ICD-10-CM | POA: Diagnosis not present

## 2023-02-19 ENCOUNTER — Encounter: Payer: Self-pay | Admitting: Advanced Practice Midwife

## 2023-02-19 ENCOUNTER — Ambulatory Visit (INDEPENDENT_AMBULATORY_CARE_PROVIDER_SITE_OTHER): Payer: Medicaid Other | Admitting: Advanced Practice Midwife

## 2023-02-19 VITALS — BP 100/60 | Wt 218.0 lb

## 2023-02-19 DIAGNOSIS — Z3A29 29 weeks gestation of pregnancy: Secondary | ICD-10-CM

## 2023-02-19 DIAGNOSIS — Z3483 Encounter for supervision of other normal pregnancy, third trimester: Secondary | ICD-10-CM

## 2023-02-19 LAB — POCT URINALYSIS DIPSTICK OB
Bilirubin, UA: NEGATIVE
Blood, UA: NEGATIVE
Glucose, UA: NEGATIVE
Ketones, UA: NEGATIVE
Leukocytes, UA: NEGATIVE
Nitrite, UA: NEGATIVE
POC,PROTEIN,UA: NEGATIVE
Spec Grav, UA: 1.01 (ref 1.010–1.025)
Urobilinogen, UA: 1 E.U./dL
pH, UA: 7 (ref 5.0–8.0)

## 2023-02-19 NOTE — Progress Notes (Addendum)
Routine Prenatal Care Visit  Subjective  Kathryn Estrada is a 27 y.o. G2P1001 at 56w6dbeing seen today for ongoing prenatal care.  She is currently monitored for the following issues for this low-risk pregnancy and has Raynaud's phenomenon without gangrene; Prolonged capillary refill time; Bilateral cold feet; Other fatigue; Dysuria; Abnormal thyroid blood test; BMI 29.0-29.9,adult; Tobacco use disorder; B12 deficiency; Supervision of other normal pregnancy, antepartum; and Previous cesarean delivery affecting pregnancy on their problem list.  ----------------------------------------------------------------------------------- Patient reports some general discomforts of pregnancy. Reviewed comfort measures.   Contractions: Not present. Vag. Bleeding: None.  Movement: Present. Leaking Fluid denies.  ----------------------------------------------------------------------------------- The following portions of the patient's history were reviewed and updated as appropriate: allergies, current medications, past family history, past medical history, past social history, past surgical history and problem list. Problem list updated.  Objective  Blood pressure 100/60, weight 218 lb (98.9 kg), last menstrual period 07/25/2022, currently breastfeeding. Pregravid weight 200 lb (90.7 kg) Total Weight Gain 18 lb (8.165 kg) Urinalysis: Urine Protein    Urine Glucose    Fetal Status: Fetal Heart Rate (bpm): 137 Fundal Height: 31 cm Movement: Present     Growth scan: 29.7%, AFI: 14.9 cm  General:  Alert, oriented and cooperative. Patient is in no acute distress.  Skin: Skin is warm and dry. No rash noted.   Cardiovascular: Normal heart rate noted  Respiratory: Normal respiratory effort, no problems with respiration noted  Abdomen: Soft, gravid, appropriate for gestational age. Pain/Pressure: Absent     Pelvic:  Cervical exam deferred        Extremities: Normal range of motion.  Edema: None  Mental Status:  Normal mood and affect. Normal behavior. Normal judgment and thought content.   Assessment   27y.o. G2P1001 at 272w6dy  05/01/2023, by Last Menstrual Period presenting for routine prenatal visit  Plan   second Problems (from 09/18/22 to present)     Problem Noted Resolved   Supervision of other normal pregnancy, antepartum 09/18/2022 by JoCleophas DunkerCMA No   Overview Addendum 02/05/2023 11:34 AM by FrImagene RichesCNM     Clinical Staff Provider  Office Location  [facility] Dating  05/01/2023, by Last Menstrual Period  Language  English Anatomy [place]  Flu Vaccine  declined Genetic Screen  NIPS: not done  TDaP vaccine  declined Hgb A1C or  GTT Early : 102 Third trimester : 75  Covid declined   LAB RESULTS   Rhogam  O/Positive/-- (10/12 1023)  Blood Type O/Positive/-- (10/12 1023)   Feeding Plan Formula d/t Raenaud''s Antibody Negative (10/12 1023)  Contraception none Rubella 3.56 (10/12 1023)  Circumcision yes RPR Non Reactive (10/12 1023)   Pediatrician  [facility] HBsAg Negative (10/12 1023)   Support Person TyDorothea OgleIV Non Reactive (10/12 1023)  Prenatal Classes no Varicella immune    GBS  (For PCN allergy, check sensitivities)   BTL Consent  Hep C Non Reactive (10/12 1023)   VBAC Consent Desires repeat Pap Diagnosis  Date Value Ref Range Status  06/12/2020      - Negative for intraepithelial lesion or malignancy (NILM)      Hgb Electro      CF      SMA                   Preterm labor symptoms and general obstetric precautions including but not limited to vaginal bleeding, contractions, leaking of fluid and fetal movement were reviewed in detail with  the patient. Please refer to After Visit Summary for other counseling recommendations.   Return in about 2 weeks (around 03/05/2023) for rob.  Rod Can, CNM 02/19/2023 9:49 AM

## 2023-03-05 ENCOUNTER — Other Ambulatory Visit: Payer: Self-pay | Admitting: Obstetrics

## 2023-03-05 DIAGNOSIS — O219 Vomiting of pregnancy, unspecified: Secondary | ICD-10-CM

## 2023-03-06 ENCOUNTER — Ambulatory Visit (INDEPENDENT_AMBULATORY_CARE_PROVIDER_SITE_OTHER): Payer: Medicaid Other | Admitting: Obstetrics & Gynecology

## 2023-03-06 VITALS — BP 100/60 | Wt 220.0 lb

## 2023-03-06 DIAGNOSIS — Z3A32 32 weeks gestation of pregnancy: Secondary | ICD-10-CM

## 2023-03-06 DIAGNOSIS — Z3483 Encounter for supervision of other normal pregnancy, third trimester: Secondary | ICD-10-CM

## 2023-03-06 DIAGNOSIS — O219 Vomiting of pregnancy, unspecified: Secondary | ICD-10-CM

## 2023-03-06 DIAGNOSIS — E669 Obesity, unspecified: Secondary | ICD-10-CM

## 2023-03-06 DIAGNOSIS — O99213 Obesity complicating pregnancy, third trimester: Secondary | ICD-10-CM | POA: Insufficient documentation

## 2023-03-06 DIAGNOSIS — O34219 Maternal care for unspecified type scar from previous cesarean delivery: Secondary | ICD-10-CM

## 2023-03-06 DIAGNOSIS — Z3490 Encounter for supervision of normal pregnancy, unspecified, unspecified trimester: Secondary | ICD-10-CM | POA: Insufficient documentation

## 2023-03-06 LAB — POCT URINALYSIS DIPSTICK OB
Bilirubin, UA: NEGATIVE
Blood, UA: NEGATIVE
Glucose, UA: NEGATIVE
Ketones, UA: NEGATIVE
Leukocytes, UA: NEGATIVE
Nitrite, UA: NEGATIVE
POC,PROTEIN,UA: NEGATIVE
Spec Grav, UA: 1.01 (ref 1.010–1.025)
Urobilinogen, UA: 1 E.U./dL
pH, UA: 6.5 (ref 5.0–8.0)

## 2023-03-06 MED ORDER — ONDANSETRON 4 MG PO TBDP: 4.0000 mg | ORAL_TABLET | Freq: Four times a day (QID) | ORAL | 2 refills | Status: DC | PRN

## 2023-03-06 NOTE — Progress Notes (Signed)
   PRENATAL VISIT NOTE  Subjective:  Kathryn Estrada is a 27 y.o. G2P1001 at [redacted]w[redacted]d being seen today for ongoing prenatal care.  She is currently monitored for the following issues for this low-risk pregnancy and has Raynaud's phenomenon without gangrene; Prolonged capillary refill time; Tobacco use disorder; B12 deficiency; Previous cesarean delivery affecting pregnancy; Supervision of normal pregnancy; and Obesity in pregnancy, antepartum, third trimester on their problem list.  Patient reports no complaints.  Contractions: Irregular.  .   . Denies leaking of fluid.   The following portions of the patient's history were reviewed and updated as appropriate: allergies, current medications, past family history, past medical history, past social history, past surgical history and problem list.   Objective:   Vitals:   03/06/23 0833  BP: 100/60  Weight: 220 lb (99.8 kg)    Fetal Status:           General:  Alert, oriented and cooperative. Patient is in no acute distress.  Skin: Skin is warm and dry. No rash noted.   Cardiovascular: Normal heart rate noted  Respiratory: Normal respiratory effort, no problems with respiration noted  Abdomen: Soft, gravid, appropriate for gestational age.  Pain/Pressure: Present     Pelvic: Cervical exam deferred        Extremities: Normal range of motion.     Mental Status: Normal mood and affect. Normal behavior. Normal judgment and thought content.   Assessment and Plan:  Pregnancy: G2P1001 at [redacted]w[redacted]d 1. Encounter for supervision of other normal pregnancy in third trimester - POC Urinalysis Dipstick OB  2. [redacted] weeks gestation of pregnancy  - POC Urinalysis Dipstick OB  3. Nausea and vomiting in pregnancy  - ondansetron (ZOFRAN-ODT) 4 MG disintegrating tablet; Take 1 tablet (4 mg total) by mouth every 6 (six) hours as needed for nausea.  Dispense: 20 tablet; Refill: 2  4. Previous cesarean delivery affecting pregnancy - we discussed TOLAC versus  RLTCS. She prefers a RLTCS and this will be scheduled for [redacted] weeks EGA  5. Obesity in pregnancy, antepartum, third trimester - normal glucola  Preterm labor symptoms and general obstetric precautions including but not limited to vaginal bleeding, contractions, leaking of fluid and fetal movement were reviewed in detail with the patient. Please refer to After Visit Summary for other counseling recommendations.   Return in about 2 weeks (around 03/20/2023).  Future Appointments  Date Time Provider [place]  03/17/2023 10:15 AM AOB-NURSE [facility] None    Emily Filbert, MD

## 2023-03-11 ENCOUNTER — Observation Stay
Admission: EM | Admit: 2023-03-11 | Discharge: 2023-03-11 | Disposition: A | Discharge status: Home / Self Care | Payer: Medicaid Other | Attending: Obstetrics | Admitting: Obstetrics

## 2023-03-11 ENCOUNTER — Encounter: Payer: Self-pay | Admitting: Obstetrics and Gynecology

## 2023-03-11 ENCOUNTER — Other Ambulatory Visit: Payer: Self-pay

## 2023-03-11 DIAGNOSIS — Z3A32 32 weeks gestation of pregnancy: Secondary | ICD-10-CM

## 2023-03-11 DIAGNOSIS — O99613 Diseases of the digestive system complicating pregnancy, third trimester: Principal | ICD-10-CM | POA: Insufficient documentation

## 2023-03-11 DIAGNOSIS — R1013 Epigastric pain: Secondary | ICD-10-CM

## 2023-03-11 DIAGNOSIS — O26893 Other specified pregnancy related conditions, third trimester: Secondary | ICD-10-CM | POA: Diagnosis present

## 2023-03-11 DIAGNOSIS — K219 Gastro-esophageal reflux disease without esophagitis: Secondary | ICD-10-CM | POA: Insufficient documentation

## 2023-03-11 MED ORDER — LACTATED RINGERS IV SOLN: 500.0000 mL | INTRAVENOUS | Status: DC | PRN

## 2023-03-11 MED ORDER — SOD CITRATE-CITRIC ACID 500-334 MG/5ML PO SOLN: 30.0000 mL | ORAL | Status: DC | PRN

## 2023-03-11 MED ORDER — FAMOTIDINE 20 MG PO TABS
20.0000 mg | ORAL_TABLET | ORAL | Status: AC
  Administered 2023-03-11: 20 mg via ORAL
  Filled 2023-03-11: qty 1

## 2023-03-11 MED ORDER — ACETAMINOPHEN 325 MG PO TABS
650.0000 mg | ORAL_TABLET | ORAL | Status: DC | PRN
  Administered 2023-03-11: 650 mg via ORAL
  Filled 2023-03-11: qty 2

## 2023-03-11 MED ORDER — ALUM & MAG HYDROXIDE-SIMETH 200-200-20 MG/5ML PO SUSP
30.0000 mL | ORAL | Status: DC | PRN
  Administered 2023-03-11: 30 mL via ORAL
  Filled 2023-03-11: qty 30

## 2023-03-11 MED ORDER — ONDANSETRON HCL 4 MG/2ML IJ SOLN: 4.0000 mg | Freq: Four times a day (QID) | INTRAMUSCULAR | Status: DC | PRN

## 2023-03-11 NOTE — OB Triage Note (Signed)
[name] 27 y.o. presents to Labor & Delivery triage via wheelchair steered by ED staff reporting upper abdominal pain. She is a G2P1001 at [redacted]w[redacted]d . She denies signs and symptoms consistent with rupture of membranes or active vaginal bleeding. She denies contractions and states positive fetal movement. External FM and TOCO applied to non-tender abdomen. Initial FHR 130. Vital signs obtained and within normal limits. Patient oriented to care environment including call bell and bed control use.

## 2023-03-11 NOTE — OB Triage Note (Signed)
LABOR & DELIVERY OB TRIAGE NOTE  SUBJECTIVE  HPI Kathryn Estrada is a 27 y.o. G2P1001 at [redacted]w[redacted]d who presented to Labor & Delivery for epigastric pain. She reports that yesterday evening, she felt pain under her right rib. She went to bed around 2245 and woke up about an hour later with epigastric pain and nausea. The pain was improved by movement. It worsens when lying flat. It wraps around to her shoulder. She has a h/o GERD and took Tums before bed last night but has not taken anything since the pain began. She denies N/V/D, contractions, vaginal bleeding, and LOF. She endorses good fetal movement. Pain improved in triage with Mylanta, Pepcid, heating pad.  OB History     Gravida  2   Para  1   Term  1   Preterm      AB      Living  1      SAB      IAB      Ectopic      Multiple  0   Live Births  1           Scheduled Meds:  famotidine  20 mg Oral NOW   Continuous Infusions:  lactated ringers     PRN Meds:.acetaminophen, alum & mag hydroxide-simeth, lactated ringers, ondansetron, sodium citrate-citric acid  OBJECTIVE  BP 110/70 (BP Location: Right Arm)   Pulse 97   Temp 98.3 F (36.8 C) (Oral)   Resp 18   LMP 07/25/2022 (Exact Date)   General: alert, cooperative, no acute distress Heart: RRR Lungs: CTAB Abdomen: soft, non-tender, gravid. Epigastric area mildly tender to deep palpation.  NST I reviewed the NST and it was reactive.  Baseline: 130 Variability: moderate Accelerations: Decelerations:none Toco: Category  ASSESSMENT Impression  1) Pregnancy at G2P1001, [redacted]w[redacted]d, Estimated Date of Delivery: 05/01/23 2) Reassuring maternal/fetal status 3) Dyspepsia  PLAN  1)  Discharge home with standard labor/return precautions. Keep scheduled ROB 2) Reviewed at-home comfort measures, dietary recommendations. Consider gallbladder work up if pain persists or worsens.  Lloyd Huger, CNM 03/11/23  1:54 AM

## 2023-03-11 NOTE — OB Triage Note (Signed)
Patient discharged home per order.  She is stable and ambulatory. An After Visit Summary was printed and given to the patient. Discharge education completed with patient and support person including follow up instructions, appointments, and medication list. She received labor and bleeding precautions. Patient able to verbalize understanding. All questions fully answered upon discharge. Patient instructed to return to ED, call 911, or call provider for any changes in condition. Patient discharged home via personal vehicle with support person and all belongings.    

## 2023-03-13 ENCOUNTER — Encounter: Payer: Self-pay | Admitting: Advanced Practice Midwife

## 2023-03-13 DIAGNOSIS — O219 Vomiting of pregnancy, unspecified: Secondary | ICD-10-CM

## 2023-03-14 MED ORDER — ONDANSETRON 4 MG PO TBDP: 4.0000 mg | ORAL_TABLET | Freq: Four times a day (QID) | ORAL | 2 refills | Status: DC | PRN

## 2023-03-17 ENCOUNTER — Ambulatory Visit (INDEPENDENT_AMBULATORY_CARE_PROVIDER_SITE_OTHER): Payer: Medicaid Other

## 2023-03-17 VITALS — BP 115/77 | HR 106 | Ht 65.0 in | Wt 220.5 lb

## 2023-03-17 DIAGNOSIS — E538 Deficiency of other specified B group vitamins: Secondary | ICD-10-CM

## 2023-03-17 MED ORDER — CYANOCOBALAMIN 1000 MCG/ML IJ SOLN
1000.0000 ug | Freq: Once | INTRAMUSCULAR | Status: AC
  Administered 2023-03-17: 1000 ug via INTRAMUSCULAR

## 2023-03-17 NOTE — Progress Notes (Cosign Needed Addendum)
    NURSE VISIT NOTE  Subjective:    Patient ID: Kathryn Estrada, female    DOB: 20-Jan-1996, 27 y.o.   MRN: [id number]  HPI  Patient is a 27 y.o. G58P1001 female who presents for B12 injection per order from Philip Aspen, CNM.   Objective:    BP 115/77   Pulse (!) 106   Ht 5\' 5"  (1.651 m)   Wt 220 lb 8 oz (100 kg)   LMP 07/25/2022 (Exact Date)   BMI 36.69 kg/m   Cyanocobalamin 1016mcg/1ml administered IM right deltoid using aseptic technique. Pt tolerated well.    Assessment:   1. B12 deficiency      Plan:    Return to clinic as scheduled for OB appointments. Patient has decided to do B 12 shots at home for the next 3 weeks. Self injection instructions given to patient, patient verbalized understanding of processes.    Marykay Lex, CMA

## 2023-03-18 ENCOUNTER — Encounter: Payer: Self-pay | Admitting: Advanced Practice Midwife

## 2023-03-18 ENCOUNTER — Ambulatory Visit (INDEPENDENT_AMBULATORY_CARE_PROVIDER_SITE_OTHER): Payer: Medicaid Other | Admitting: Advanced Practice Midwife

## 2023-03-18 VITALS — BP 119/76 | HR 105 | Wt 220.0 lb

## 2023-03-18 DIAGNOSIS — Z3A33 33 weeks gestation of pregnancy: Secondary | ICD-10-CM

## 2023-03-18 DIAGNOSIS — Z3483 Encounter for supervision of other normal pregnancy, third trimester: Secondary | ICD-10-CM

## 2023-03-18 LAB — POCT URINALYSIS DIPSTICK OB
Bilirubin, UA: NEGATIVE
Blood, UA: NEGATIVE
Glucose, UA: NEGATIVE
Ketones, UA: 0.5
Leukocytes, UA: NEGATIVE
Nitrite, UA: NEGATIVE
Spec Grav, UA: 1.015 (ref 1.010–1.025)
Urobilinogen, UA: 1 E.U./dL
pH, UA: 5.5 (ref 5.0–8.0)

## 2023-03-18 LAB — GLUCOSE, POCT (MANUAL RESULT ENTRY): POC Glucose: 111 mg/dl — AB (ref 70–99)

## 2023-03-18 NOTE — Progress Notes (Signed)
Routine Prenatal Care Visit  Subjective  Kathryn Estrada is a 27 y.o. G2P1001 at [redacted]w[redacted]d being seen today for ongoing prenatal care.  She is currently monitored for the following issues for this low-risk pregnancy and has Raynaud's phenomenon without gangrene; Prolonged capillary refill time; Tobacco use disorder; B12 deficiency; Previous cesarean delivery affecting pregnancy; Supervision of normal pregnancy; Obesity in pregnancy, antepartum, third trimester; and Labor and delivery, indication for care on their problem list.  ----------------------------------------------------------------------------------- Patient reports increasing episodes of dizziness- feeling as if she will pass out the further she gets in her pregnancy. Lately it is happening daily and begins as feeling hot, headache, tingling in arms and legs and dizziness/blurry vision. She reports decreased appetite now- (had 3 pieces of bacon for breakfast) and adequate hydration. She mentions recently eating an ice cream cone with immediate onset of symptoms. Other than that standing and walking for long times seems to be a trigger. Of note- her urine is tea color this morning. She has been evaluated for her symptoms at the hospital. MRI, ECG, labs essentially normal in January. She also saw ophthalmologist to rule out intracranial hypertension. Some vitamin deficiencies noted in February. We discussed other possible referrals to endo, cardiology, neurology. She prefers to wait until after her pregnancy to see if symptoms resolve with delivery. Her repeat c/s is scheduled for May 2nd. Advised she use caution until then when walking for long periods, stay hydrated and eat adequate calories/protein. Her mom is with her today to help with active toddler.   Contractions: Not present. Vag. Bleeding: None.  Movement: Present. Leaking Fluid denies.  ----------------------------------------------------------------------------------- The following portions  of the patient's history were reviewed and updated as appropriate: allergies, current medications, past family history, past medical history, past social history, past surgical history and problem list. Problem list updated.  Objective  Blood pressure 119/76, pulse (!) 105, weight 220 lb (99.8 kg), last menstrual period 07/25/2022, currently breastfeeding. Pregravid weight 200 lb (90.7 kg) Total Weight Gain 20 lb (9.072 kg) Urinalysis: Urine Protein Small (1+)  Urine Glucose Negative  POCT blood sugar: 111  Fetal Status: Fetal Heart Rate (bpm): 135 Fundal Height: 34 cm Movement: Present     General:  Alert, oriented and cooperative. Patient is in no acute distress.  Skin: Skin is warm and dry. No rash noted.   Cardiovascular: Normal heart rate noted  Respiratory: Normal respiratory effort, no problems with respiration noted  Abdomen: Soft, gravid, appropriate for gestational age. Pain/Pressure: Absent     Pelvic:  Cervical exam deferred        Extremities: Normal range of motion.  Edema: None  Mental Status: Normal mood and affect. Normal behavior. Normal judgment and thought content.   Assessment   27 y.o. G2P1001 at [redacted]w[redacted]d by  05/01/2023, by Last Menstrual Period presenting for routine prenatal visit  Plan   second Problems (from 09/18/22 to present)     Problem Noted Resolved   Supervision of normal pregnancy 03/06/2023 by Emily Filbert, MD No   Overview Signed 03/18/2023 10:57 AM by Rod Can, CNM     Nursing Staff Provider  Office Location  [place] Dating  By LMP  Language  English Anatomy US    Flu Vaccine   Genetic Screen  NIPS:   TDaP vaccine    Hgb A1C or  GTT Early : 102 Third trimester : 75  Covid    LAB RESULTS   Rhogam   Blood Type O/Positive/-- (10/12 1023)   Feeding Plan  Antibody Negative (10/12 1023)  Contraception  Rubella 3.56 (10/12 1023)  Circumcision  RPR Non Reactive (02/14 1048)   Pediatrician   HBsAg Negative (10/12 1023)   Support Person  HIV Non  Reactive (02/14 1048)  Prenatal Classes  Varicella     GBS  (For PCN allergy, check sensitivities)   BTL Consent     VBAC Consent  Pap      Hgb Electro    Pelvis Tested  CF      SMA               Supervision of other normal pregnancy, antepartum 09/18/2022 by Cleophas Dunker, [place] 03/06/2023 by Emily Filbert, MD   Overview Addendum 02/19/2023  9:53 AM by Rod Can, CNM     Clinical Staff Provider  Office Location  [facility] Dating  05/01/2023, by Last Menstrual Period  Language  English Anatomy US  female  Flu Vaccine  declined Genetic Screen  NIPS: not done  TDaP vaccine  declined Hgb A1C or  GTT Early : 102 Third trimester : 75  Covid declined   LAB RESULTS   Rhogam  O/Positive/-- (10/12 1023)  Blood Type O/Positive/-- (10/12 1023)   Feeding Plan Formula d/t Raenaud''s Antibody Negative (10/12 1023)  Contraception none Rubella 3.56 (10/12 1023)  Circumcision yes RPR Non Reactive (10/12 1023)   Pediatrician  [facility] HBsAg Negative (10/12 1023)   Support Person [name] HIV Non Reactive (10/12 1023)  Prenatal Classes no Varicella immune    GBS  (For PCN allergy, check sensitivities)   BTL Consent  Hep C Non Reactive (10/12 1023)   VBAC Consent Desires repeat Pap Diagnosis  Date Value Ref Range Status  06/12/2020      - Negative for intraepithelial lesion or malignancy (NILM)      Hgb Electro      CF      SMA                   Preterm labor symptoms and general obstetric precautions including but not limited to vaginal bleeding, contractions, leaking of fluid and fetal movement were reviewed in detail with the patient. Please refer to After Visit Summary for other counseling recommendations.   Return in about 2 weeks (around 04/01/2023) for rob.  Rod Can, CNM 03/18/2023 11:23 AM

## 2023-03-26 ENCOUNTER — Encounter: Payer: Self-pay | Admitting: Advanced Practice Midwife

## 2023-03-26 DIAGNOSIS — O219 Vomiting of pregnancy, unspecified: Secondary | ICD-10-CM

## 2023-03-27 ENCOUNTER — Telehealth: Payer: Self-pay

## 2023-03-27 ENCOUNTER — Ambulatory Visit (INDEPENDENT_AMBULATORY_CARE_PROVIDER_SITE_OTHER): Payer: Medicaid Other

## 2023-03-27 VITALS — BP 125/78 | HR 110 | Resp 16 | Ht 65.0 in | Wt 223.5 lb

## 2023-03-27 DIAGNOSIS — Z3A35 35 weeks gestation of pregnancy: Secondary | ICD-10-CM

## 2023-03-27 DIAGNOSIS — R350 Frequency of micturition: Secondary | ICD-10-CM | POA: Diagnosis not present

## 2023-03-27 DIAGNOSIS — O99891 Other specified diseases and conditions complicating pregnancy: Secondary | ICD-10-CM | POA: Diagnosis not present

## 2023-03-27 DIAGNOSIS — O26899 Other specified pregnancy related conditions, unspecified trimester: Secondary | ICD-10-CM

## 2023-03-27 LAB — POCT URINALYSIS DIPSTICK OB
Bilirubin, UA: NEGATIVE
Glucose, UA: NEGATIVE
Ketones, UA: NEGATIVE
Leukocytes, UA: NEGATIVE
Nitrite, UA: NEGATIVE
Spec Grav, UA: 1.01 (ref 1.010–1.025)
Urobilinogen, UA: 0.2 E.U./dL
pH, UA: 7 (ref 5.0–8.0)

## 2023-03-27 MED ORDER — ONDANSETRON 4 MG PO TBDP: 4.0000 mg | ORAL_TABLET | Freq: Four times a day (QID) | ORAL | 2 refills | Status: DC | PRN

## 2023-03-27 NOTE — Progress Notes (Signed)
    NURSE VISIT NOTE  Subjective:    Patient ID: Kathryn Estrada, female    DOB: Jun 06, 1996, 27 y.o.   MRN: [id number]       HPI  Patient is a 27 y.o. G72P1001 female who presents for urinary frequency, urinary urgency, pelvic pain, and discomfort and back pain  for 1 week.  Patient denies hematuria, flank pain, and abdominal pain.  Patient does not have a history of recurrent UTI.  Patient does not have a history of pyelonephritis.    Objective:    LMP 07/25/2022 (Exact Date)    Lab Review  Results for orders placed or performed in visit on 03/27/23  POC Urinalysis Dipstick OB  Result Value Ref Range   Color, UA     Clarity, UA     Glucose, UA Negative Negative   Bilirubin, UA Negative    Ketones, UA Negative    Spec Grav, UA 1.010 1.010 - 1.025   Blood, UA Moderate    pH, UA 7.0 5.0 - 8.0   POC,PROTEIN,UA Small (1+) Negative, Trace, Small (1+), Moderate (2+), Large (3+), 4+   Urobilinogen, UA 0.2 0.2 or 1.0 E.U./dL   Nitrite, UA Negative    Leukocytes, UA Negative Negative   Appearance     Odor      Assessment:   1. Pelvic pressure in pregnancy      Plan:   Urine Culture Sent. Maintain adequate hydration.  May use AZO OTC prn.  Follow up if symptoms worsen or fail to improve as anticipated, and as needed.  Add cranberry juice     Chilton Greathouse, [facility]

## 2023-03-27 NOTE — Telephone Encounter (Signed)
Pt called after hour nurse 03/26/23 5:34pm stating she will be 35 weeks tomorrow; having severe pressure; hurts to walk; feels like she has to urinate again right after she gets off toilet; baby is head down; pt is miserable.  Pressure started and urge to urinate started approx 7d ago and got worse 4d ago; sxs are constant; urine has an odor; unable to tell if cannot empty bladder; has few contx - 2 per day; last night had vomiting and diarrhea but not today.  After hour nurse adv pt to go to ER but AMT adv her to come in the office this morning to give a urine sample.

## 2023-03-27 NOTE — Patient Instructions (Signed)
Pregnancy and Urinary Tract Infection ? ?A urinary tract infection (UTI) is an infection of any part of the urinary tract. This includes the kidneys, the tubes that connect the kidneys to the bladder (ureters), the bladder, and the tube that carries urine out of the body (urethra). These organs make, store, and get rid of urine in the body. Your health care provider may use other names to describe the infection. An upper UTI affects the ureters and kidneys (pyelonephritis). A lower UTI affects the bladder (cystitis) and urethra (urethritis). ?Most UTIs are caused by bacteria in the genital area, around the entrance to the urinary tract. These bacteria grow and cause irritation and inflammation of the urinary tract. You are more likely to develop a UTI during pregnancy because: ?The physical and hormonal changes that your body goes through make it easier for bacteria to get into your urinary tract. ?Your growing baby puts pressure on your bladder and can affect urine flow. ?Pregnant women with diabetes are at an increased risk for developing a UTI. It is important to recognize and treat UTIs in pregnancy because they can cause serious complications for both you and your baby. ?How does this affect me? ?Symptoms of a UTI include: ?Needing to urinate right away (urgently) and often, even if urinating a small amount. ?Pain, burning, or having a hard time passing urine. ?Blood in the urine. ?Unusual, cloudy, and bad-smelling urine. ?Pain in the abdomen or lower back. ?Vaginal discharge. ?You may also have: ?Vomiting or a decreased appetite. ?Confusion. ?Irritability or tiredness. ?A fever. ?Diarrhea. ?A low level of red blood cells (anemia). ?The development of high blood pressure during pregnancy (preeclampsia). ?How does this affect my baby? ?An untreated UTI during pregnancy could lead to a kidney infection or an infection throughout the mother's body (systemic infection). This can cause health problems and affect the  baby. Possible complications of an untreated UTI include: ?Your baby being born before 37 weeks of pregnancy (premature). ?Your baby being born with a low birth weight. ?Your baby having a higher risk of having his or her skin or the white parts of the eyes turn yellow (jaundice). ?What can I do to lower my risk? ?To prevent a UTI: ?Do not hold urine for long periods of time. Empty your bladder as soon as you feel the urge. ?Always wipe from front to back, especially after a bowel movement. Use each tissue one time when you wipe. ?Empty your bladder after sex. ?Keep your genital area dry. ?Drink 6 to 8 glasses of water each day. ?Do not douche or use deodorant sprays. ?Wear cotton underwear and loose clothing. ?How is this treated? ?Treatment for this condition may include: ?Antibiotic medicines that are safe to take during pregnancy. ?Other medicines to treat less common causes of UTI. ?Follow these instructions at home: ?If you were prescribed an antibiotic medicine, take it as told by your health care provider. Do not stop using the antibiotic even if you start to feel better. ?Keep all follow-up visits. This is important. ?Contact a health care provider if: ?Your symptoms do not improve or they get worse. ?You have abnormal vaginal discharge. ?Get help right away if you: ?Have a fever. ?Have nausea and vomiting. ?Have back or side pain. ?Have lower belly pain, tightness, or feel contractions in your uterus. ?Have a gush of fluid from your vagina. ?Have blood in your urine. ?Summary ?A UTI is an infection of any part of the urinary tract, which includes the kidneys, ureters, bladder,   and urethra. ?Most urinary tract infections are caused by bacteria in your genital area, around the entrance to your urinary tract (urethra). ?You are more likely to develop a UTI during pregnancy. It is important to recognize and treat UTIs in pregnancy because of the risk of serious complications for both you and your baby. ?If you  were prescribed an antibiotic medicine, take it as told by your health care provider. Do not stop using the antibiotic even if you start to feel better. ?This information is not intended to replace advice given to you by your health care provider. Make sure you discuss any questions you have with your health care provider. ?Document Revised: 07/25/2021 Document Reviewed: 07/25/2021 ?Elsevier Patient Education ? 2023 Elsevier Inc. ? ?

## 2023-03-27 NOTE — Telephone Encounter (Signed)
The patient is scheduled for nurse visit 4/4 at 10:45 am. Th patient is aware.

## 2023-03-28 ENCOUNTER — Telehealth: Payer: Self-pay

## 2023-03-28 NOTE — Telephone Encounter (Signed)
Pt calling; didn't realize when we spoke before that tomorrow is Sat and her results should be back tomorrow; how does it work if she needs rx?  [phone]  Adv pt there is someone on call from this practice on the weekends.  She would call the main number and do what the recording says and the after hour nurse can page the on call.

## 2023-03-28 NOTE — Telephone Encounter (Signed)
Pt calling; was seen yesterday for pressure in preg; has seen her results in mychart; can't tell if she has a UTI or not.  [phone]  Adv pt that what she is seeing is the urine test we did in office yesterday; not the culture results; was sent for culture d/t mod blood and small protein which is probably from the blood.  Adv urine culture has to grow for 48 hrs before we get the results back.  Pt is not feeling better than she did yesterday.

## 2023-03-28 NOTE — Telephone Encounter (Signed)
This encounter was created in error - please disregard.

## 2023-03-29 ENCOUNTER — Other Ambulatory Visit: Payer: Self-pay | Admitting: Certified Nurse Midwife

## 2023-03-29 LAB — URINE CULTURE

## 2023-03-29 MED ORDER — NITROFURANTOIN MONOHYD MACRO 100 MG PO CAPS: 100.0000 mg | ORAL_CAPSULE | Freq: Two times a day (BID) | ORAL | 0 refills | Status: AC

## 2023-03-29 NOTE — Progress Notes (Signed)
Pt called nurse line with continued concerns for UTI, state she has urgency , frequency , and pressure. Urine culture completed show some bacterial growth    Mixed urogenital flora 10,000-25,000 colony forming units per mL   Given pt is symptomatic and pregnant will treat with Macrobid 100 mg BID x 5 days. Orders placed.   Doreene Burke, CNM

## 2023-04-02 ENCOUNTER — Ambulatory Visit (INDEPENDENT_AMBULATORY_CARE_PROVIDER_SITE_OTHER): Payer: Medicaid Other | Admitting: Certified Nurse Midwife

## 2023-04-02 ENCOUNTER — Encounter: Payer: Self-pay | Admitting: Certified Nurse Midwife

## 2023-04-02 ENCOUNTER — Other Ambulatory Visit (HOSPITAL_COMMUNITY)
Admission: RE | Admit: 2023-04-02 | Discharge: 2023-04-02 | Disposition: A | Discharge status: Home / Self Care | Payer: Medicaid Other | Source: Ambulatory Visit | Attending: Certified Nurse Midwife | Admitting: Certified Nurse Midwife

## 2023-04-02 VITALS — BP 139/82 | HR 94 | Wt 223.5 lb

## 2023-04-02 DIAGNOSIS — Z3A35 35 weeks gestation of pregnancy: Secondary | ICD-10-CM | POA: Diagnosis not present

## 2023-04-02 DIAGNOSIS — Z3685 Encounter for antenatal screening for Streptococcus B: Secondary | ICD-10-CM

## 2023-04-02 DIAGNOSIS — Z3403 Encounter for supervision of normal first pregnancy, third trimester: Secondary | ICD-10-CM | POA: Diagnosis present

## 2023-04-02 DIAGNOSIS — N898 Other specified noninflammatory disorders of vagina: Secondary | ICD-10-CM

## 2023-04-02 LAB — POCT URINALYSIS DIPSTICK OB
Bilirubin, UA: NEGATIVE
Blood, UA: NEGATIVE
Glucose, UA: NEGATIVE
Ketones, UA: NEGATIVE
Leukocytes, UA: NEGATIVE
Nitrite, UA: NEGATIVE
POC,PROTEIN,UA: NEGATIVE
Spec Grav, UA: <=1.005 — AB (ref 1.010–1.025)
Urobilinogen, UA: 0.2 E.U./dL
pH, UA: 7.5 (ref 5.0–8.0)

## 2023-04-02 NOTE — Progress Notes (Signed)
ROB doing well, feeling lots of movement. Denies any concerns today. Pt asks what would happen if she went into labor before her scheduled c/section. Discussed proceeding with C/s at that time . She verbalizes and agrees to plan . PT c/o clear white discharge. States that it is thin. That she has to wear a panty liner. She denies a gush of fluid. She denies itching , burning or odor. Spec exam completed. No clear /white discharge. No fluid see . Swab collected for yeast/BV as well as gc/chlamydia and GBS today. Will follow up with results.   ROB 1 wk.   Doreene Burke, CNM

## 2023-04-02 NOTE — Addendum Note (Signed)
Addended by: Fonda Kinder on: 04/02/2023 01:43 PM   Modules accepted: Orders

## 2023-04-02 NOTE — Patient Instructions (Signed)
Group B Streptococcus Infection During Pregnancy Group B Streptococcus (GBS) is a type of bacteria that is often found in healthy people. It is commonly found in the rectum, vagina, and intestines. In people who are healthy and not pregnant, the bacteria rarely cause serious illness or complications. However, women who test positive for GBS during pregnancy can pass the bacteria to the baby during childbirth. This can cause serious infection in the baby after birth. Women with GBS may also have infections during their pregnancy or soon after childbirth. The infections include urinary tract infections (UTIs) or infections of the uterus. GBS also increases a woman's risk of complications during pregnancy, such as early labor or delivery, miscarriage, or stillbirth. Routine testing for GBS is recommended for all pregnant women. What are the causes? This condition is caused by bacteria called Streptococcus agalactiae. What increases the risk? You may have a higher risk for GBS infection during pregnancy if you had one during a past pregnancy. What are the signs or symptoms? In most cases, GBS infection does not cause symptoms in pregnant women. If symptoms exist, they may include: Labor that starts before the 37th week of pregnancy. A UTI or bladder infection. This may cause a fever, frequent urination, or pain and burning during urination. Fever during labor. There can also be a rapid heartbeat in the mother or baby. Rare but serious symptoms of a GBS infection in women include: Blood infection (septicemia). This may cause fever, chills, or confusion. Lung infection (pneumonia). This may cause fever, chills, cough, rapid breathing, chest pain, or difficulty breathing. Bone, joint, skin, or soft tissue infection. How is this diagnosed? You may be screened for GBS between week 35 and week 37 of pregnancy. If you have symptoms of preterm labor, you may be screened earlier. This condition is diagnosed  based on lab test results from: A swab of fluid from the vagina and rectum. A urine sample. How is this treated? This condition is treated with antibiotic medicine. Antibiotic medicine may be given: To you when you go into labor, or as soon as your water breaks. The medicines will continue until after you give birth. If you are having a cesarean delivery, you do not need antibiotics unless your water has broken. To your baby, if he or she requires treatment. Your health care provider will check your baby to decide if he or she needs antibiotics to prevent a serious infection. Follow these instructions at home: Take over-the-counter and prescription medicines only as told by your health care provider. Take your antibiotic medicine as told by your health care provider. Do not stop taking the antibiotic even if you start to feel better. Keep all pre-birth (prenatal) visits and follow-up visits as told by your health care provider. This is important. Contact a health care provider if: You have pain or burning when you urinate. You have to urinate more often than usual. You have a fever or chills. You develop a bad-smelling vaginal discharge. Get help right away if: Your water breaks. You go into labor. You have severe pain in your abdomen. You have difficulty breathing. You have chest pain. These symptoms may represent a serious problem that is an emergency. Do not wait to see if the symptoms will go away. Get medical help right away. Call your local emergency services (911 in the [place] Do not drive yourself to the hospital. Summary GBS is a type of bacteria that is common in healthy people. During pregnancy, colonization with GBS can cause   serious complications for you or your baby. Your health care provider will screen you between 35 and 37 weeks of pregnancy to determine if you are colonized with GBS. If you are colonized with GBS during pregnancy, your health care provider will recommend  antibiotics through an IV during labor. After delivery, your baby will be evaluated for complications related to potential GBS infection and may require antibiotics to prevent a serious infection. This information is not intended to replace advice given to you by your health care provider. Make sure you discuss any questions you have with your health care provider. Document Revised: 10/10/2020 Document Reviewed: 07/05/2019 Elsevier Patient Education  2023 Elsevier Inc. Braxton Hicks Contractions  Contractions of the uterus can occur throughout pregnancy, but they are not always a sign that you are in labor. You may have practice contractions called Braxton Hicks contractions. These false labor contractions are sometimes confused with true labor. What are Braxton Hicks contractions? Braxton Hicks contractions are tightening movements that occur in the muscles of the uterus before labor. Unlike true labor contractions, these contractions do not result in opening (dilation) and thinning of the lowest part of the uterus (cervix). Toward the end of pregnancy (32-34 weeks), Braxton Hicks contractions can happen more often and may become stronger. These contractions are sometimes difficult to tell apart from true labor because they can be very uncomfortable. How to tell the difference between true labor and false labor True labor Contractions last 30-70 seconds. Contractions become very regular. Discomfort is usually felt in the top of the uterus, and it spreads to the lower abdomen and low back. Contractions do not go away with walking. Contractions usually become stronger and more frequent. The cervix dilates and gets thinner. False labor Contractions are usually shorter, weaker, and farther apart than true labor contractions. Contractions are usually irregular. Contractions are often felt in the front of the lower abdomen and in the groin. Contractions may go away when you walk around or change  positions while lying down. The cervix usually does not dilate or become thin. Sometimes, the only way to tell if you are in true labor is for your health care provider to look for changes in your cervix. Your health care provider will do a physical exam and may monitor your contractions. If you are in true labor, your health care provider will send you home with instructions about when to return to the hospital. You may continue to have Braxton Hicks contractions until you go into true labor. Follow these instructions at home:  Take over-the-counter and prescription medicines only as told by your health care provider. If Braxton Hicks contractions are making you uncomfortable: Change your position from lying down or resting to walking, or change from walking to resting. Sit and rest in a tub of warm water. Drink enough fluid to keep your urine pale yellow. Dehydration may cause these contractions. Do slow and deep breathing several times an hour. Keep all follow-up visits. This is important. Contact a health care provider if: You have a fever. You have continuous pain in your abdomen. Your contractions become stronger, more regular, and closer together. You pass blood-tinged mucus. Get help right away if: You have fluid leaking or gushing from your vagina. You have bright red blood coming from your vagina. Your baby is not moving inside you as much as it used to. Summary You may have practice contractions called Braxton Hicks contractions. These false labor contractions are sometimes confused with true labor. Braxton Hicks   contractions are usually shorter, weaker, farther apart, and less regular than true labor contractions. True labor contractions usually become stronger, more regular, and more frequent. Manage discomfort from Braxton Hicks contractions by changing position, resting in a warm bath, practicing deep breathing, and drinking plenty of water. Keep all follow-up visits.  Contact your health care provider if your contractions become stronger, more regular, and closer together. This information is not intended to replace advice given to you by your health care provider. Make sure you discuss any questions you have with your health care provider. Document Revised: 10/16/2020 Document Reviewed: 10/16/2020 Elsevier Patient Education  2023 Elsevier Inc.  

## 2023-04-04 LAB — CERVICOVAGINAL ANCILLARY ONLY
Bacterial Vaginitis (gardnerella): NEGATIVE
Candida Glabrata: NEGATIVE
Candida Vaginitis: NEGATIVE
Chlamydia: NEGATIVE
Comment: NEGATIVE
Comment: NEGATIVE
Comment: NEGATIVE
Comment: NEGATIVE
Comment: NEGATIVE
Comment: NORMAL
Neisseria Gonorrhea: NEGATIVE
Trichomonas: NEGATIVE

## 2023-04-05 ENCOUNTER — Encounter: Payer: Self-pay | Admitting: Obstetrics

## 2023-04-06 LAB — CULTURE, BETA STREP (GROUP B ONLY): Strep Gp B Culture: NEGATIVE

## 2023-04-10 ENCOUNTER — Ambulatory Visit (INDEPENDENT_AMBULATORY_CARE_PROVIDER_SITE_OTHER): Payer: Medicaid Other | Admitting: Licensed Practical Nurse

## 2023-04-10 ENCOUNTER — Ambulatory Visit (INDEPENDENT_AMBULATORY_CARE_PROVIDER_SITE_OTHER): Payer: Medicaid Other

## 2023-04-10 VITALS — BP 138/92 | HR 96 | Ht 65.0 in | Wt 225.8 lb

## 2023-04-10 VITALS — BP 141/85 | HR 112 | Wt 225.8 lb

## 2023-04-10 DIAGNOSIS — O368130 Decreased fetal movements, third trimester, not applicable or unspecified: Secondary | ICD-10-CM

## 2023-04-10 DIAGNOSIS — Z3403 Encounter for supervision of normal first pregnancy, third trimester: Secondary | ICD-10-CM

## 2023-04-10 DIAGNOSIS — Z3A37 37 weeks gestation of pregnancy: Secondary | ICD-10-CM | POA: Diagnosis not present

## 2023-04-10 LAB — POCT URINALYSIS DIPSTICK
Bilirubin, UA: NEGATIVE
Blood, UA: NEGATIVE
Glucose, UA: NEGATIVE
Ketones, UA: NEGATIVE
Leukocytes, UA: NEGATIVE
Nitrite, UA: NEGATIVE
Protein, UA: NEGATIVE
Spec Grav, UA: 1.01 (ref 1.010–1.025)
Urobilinogen, UA: 0.2 E.U./dL
pH, UA: 6.5 (ref 5.0–8.0)

## 2023-04-10 NOTE — Progress Notes (Signed)
    NURSE VISIT NOTE  Subjective:    Patient ID: Kathryn Estrada, female    DOB: December 15, 1996, 27 y.o.   MRN: [id number]  HPI  Patient is a 27 y.o. G42P1001 female who presents for fetal monitoring per order from Carie Caddy, [place].   Objective:    BP (!) 138/92   Pulse 96   Ht  (1.651 m)   Wt 225 lb 12.8 oz (102.4 kg)   LMP 07/25/2022 (Exact Date)   BMI 37.58 kg/m  Estimated Date of Delivery: 05/01/23  [redacted]w[redacted]d  Fetus A Non-Stress Test Interpretation for 04/10/23  Indication: Decreased Fetal Movement  Fetal Heart Rate A Mode: External Baseline Rate (A): 145 bpm Variability: Moderate Accelerations: 15 x 15 Decelerations: None Multiple birth?: No  Uterine Activity Mode: Toco Contraction Frequency (min): None  Interpretation (Fetal Testing) Nonstress Test Interpretation: Reactive Overall Impression: Reassuring for gestational age   Assessment:   1. Decreased fetal movements in third trimester, single or unspecified fetus   2. [redacted] weeks gestation of pregnancy      Plan:   Results reviewed and discussed with patient by  Carie Caddy, CNM.     Rocco Serene, LPN

## 2023-04-10 NOTE — Progress Notes (Signed)
Routine Prenatal Care Visit  Subjective  Kathryn Estrada is a 27 y.o. G2P1001 at [redacted]w[redacted]d being seen today for ongoing prenatal care.  She is currently monitored for the following issues for this high-risk pregnancy and has Raynaud's phenomenon without gangrene; Prolonged capillary refill time; Tobacco use disorder; B12 deficiency; Previous cesarean delivery affecting pregnancy; Supervision of normal pregnancy; Obesity in pregnancy, antepartum, third trimester; Labor and delivery, indication for care; Decreased fetal movements in third trimester; and [redacted] weeks gestation of pregnancy on their problem list.  ----------------------------------------------------------------------------------- Patient reports heartburn.   -has not felt the baby move as often, normally she is constantly active but over the last week she is not as active, she had felt one or two movements this morning. RNST, fetus active during NST -Has had a pain on her right side, but only when she lays on that side this has been happening for 2-3 weeks -BP elevated today has had mild headaches, denies one currently. Has had blurry vision since about 20 weeks, has been evaluated by an eye doctor. This is her first elevated blood pressure in pregnancy. Reviewed GHTN versus preeclampsia. She has a ROB next Wednesday, if BP elevated then, will need labs and an NST.  -she would like one more child, does not plan touse BC as sh edoes not like it. May use condoms.   Contractions: Irritability. Vag. Bleeding: None.  Movement: Present. Leaking Fluid denies.  ----------------------------------------------------------------------------------- The following portions of the patient's history were reviewed and updated as appropriate: allergies, current medications, past family history, past medical history, past social history, past surgical history and problem list. Problem list updated.  Objective  Blood pressure (!) 141/85, pulse (!) 112, weight 225 lb  12.8 oz (102.4 kg), last menstrual period 07/25/2022, currently breastfeeding. Pregravid weight 200 lb (90.7 kg) Total Weight Gain 25 lb 12.8 oz (11.7 kg) Urinalysis: Urine Protein    Urine Glucose    Fetal Status:     Movement: Present     General:  Alert, oriented and cooperative. Patient is in no acute distress.  Skin: Skin is warm and dry. No rash noted.   Cardiovascular: Normal heart rate noted  Respiratory: Normal respiratory effort, no problems with respiration noted  Abdomen: Soft, gravid, appropriate for gestational age. Pain/Pressure: Present     Pelvic:  Cervical exam deferred        Extremities: Normal range of motion.     Mental Status: Normal mood and affect. Normal behavior. Normal judgment and thought content.   Assessment   27 y.o. G2P1001 at [redacted]w[redacted]d by  05/01/2023, by Last Menstrual Period presenting for routine prenatal visit  Plan   second Problems (from 09/18/22 to present)     Problem Noted Resolved   Supervision of normal pregnancy 03/06/2023 by Allie Bossier, MD No   Overview Signed 03/18/2023 10:57 AM by Tresea Mall, CNM     Nursing Staff Provider  Office Location   Dating  By LMP  Language  English Anatomy US    Flu Vaccine   Genetic Screen  NIPS:   TDaP vaccine    Hgb A1C or  GTT Early : 102 Third trimester : 75  Covid    LAB RESULTS   Rhogam   Blood Type O/Positive/-- (10/12 1023)   Feeding Plan  Antibody Negative (10/12 1023)  Contraception  Rubella 3.56 (10/12 1023)  Circumcision  RPR Non Reactive (02/14 1048)   Pediatrician   HBsAg Negative (10/12 1023)   Support Person  HIV Non  Reactive (02/14 1048)  Prenatal Classes  Varicella     GBS  (For PCN allergy, check sensitivities)   BTL Consent     VBAC Consent  Pap      Hgb Electro    Pelvis Tested  CF      SMA               Supervision of other normal pregnancy, antepartum 09/18/2022 by Loran Senters, CMA 03/06/2023 by Allie Bossier, MD   Overview Addendum 02/19/2023  9:53 AM by  Tresea Mall, CNM     Clinical Staff Provider  Office Location  [facility] Dating  05/01/2023, by Last Menstrual Period  Language  English Anatomy US  female  Flu Vaccine  declined Genetic Screen  NIPS: not done  TDaP vaccine  declined Hgb A1C or  GTT Early : 102 Third trimester : 75  Covid declined   LAB RESULTS   Rhogam  O/Positive/-- (10/12 1023)  Blood Type O/Positive/-- (10/12 1023)   Feeding Plan Formula d/t Raenaud''s Antibody Negative (10/12 1023)  Contraception none Rubella 3.56 (10/12 1023)  Circumcision yes RPR Non Reactive (10/12 1023)   Pediatrician  [facility] HBsAg Negative (10/12 1023)   Support Person [name] HIV Non Reactive (10/12 1023)  Prenatal Classes no Varicella immune    GBS  (For PCN allergy, check sensitivities)   BTL Consent  Hep C Non Reactive (10/12 1023)   VBAC Consent Desires repeat Pap Diagnosis  Date Value Ref Range Status  06/12/2020      - Negative for intraepithelial lesion or malignancy (NILM)      Hgb Electro      CF      SMA                   Term labor symptoms and general obstetric precautions including but not limited to vaginal bleeding, contractions, leaking of fluid and fetal movement were reviewed in detail with the patient. Please refer to After Visit Summary for other counseling recommendations.   Return in about 6 days (around 04/16/2023) for ROB. Will need PIH labs if BP elevated at next visit  Carie Caddy, Ina Homes   [facility]  04/10/23  12:40 PM

## 2023-04-11 ENCOUNTER — Other Ambulatory Visit: Payer: Self-pay

## 2023-04-11 ENCOUNTER — Telehealth: Payer: Self-pay

## 2023-04-11 ENCOUNTER — Encounter: Payer: Self-pay | Admitting: Obstetrics and Gynecology

## 2023-04-11 ENCOUNTER — Observation Stay
Admission: EM | Admit: 2023-04-11 | Discharge: 2023-04-11 | Disposition: A | Discharge status: Home / Self Care | Payer: Medicaid Other | Attending: Obstetrics and Gynecology | Admitting: Obstetrics and Gynecology

## 2023-04-11 DIAGNOSIS — O26893 Other specified pregnancy related conditions, third trimester: Secondary | ICD-10-CM | POA: Diagnosis not present

## 2023-04-11 DIAGNOSIS — Z3A37 37 weeks gestation of pregnancy: Secondary | ICD-10-CM

## 2023-04-11 DIAGNOSIS — R030 Elevated blood-pressure reading, without diagnosis of hypertension: Secondary | ICD-10-CM | POA: Diagnosis not present

## 2023-04-11 DIAGNOSIS — R1013 Epigastric pain: Secondary | ICD-10-CM | POA: Diagnosis not present

## 2023-04-11 DIAGNOSIS — Z3483 Encounter for supervision of other normal pregnancy, third trimester: Secondary | ICD-10-CM

## 2023-04-11 DIAGNOSIS — D649 Anemia, unspecified: Secondary | ICD-10-CM | POA: Diagnosis not present

## 2023-04-11 DIAGNOSIS — Z98891 History of uterine scar from previous surgery: Secondary | ICD-10-CM | POA: Insufficient documentation

## 2023-04-11 DIAGNOSIS — F17290 Nicotine dependence, other tobacco product, uncomplicated: Secondary | ICD-10-CM | POA: Diagnosis not present

## 2023-04-11 DIAGNOSIS — O163 Unspecified maternal hypertension, third trimester: Secondary | ICD-10-CM | POA: Diagnosis present

## 2023-04-11 DIAGNOSIS — O99891 Other specified diseases and conditions complicating pregnancy: Secondary | ICD-10-CM | POA: Diagnosis not present

## 2023-04-11 DIAGNOSIS — O99013 Anemia complicating pregnancy, third trimester: Secondary | ICD-10-CM | POA: Insufficient documentation

## 2023-04-11 DIAGNOSIS — R1011 Right upper quadrant pain: Secondary | ICD-10-CM | POA: Diagnosis not present

## 2023-04-11 DIAGNOSIS — Z79899 Other long term (current) drug therapy: Secondary | ICD-10-CM | POA: Insufficient documentation

## 2023-04-11 DIAGNOSIS — O34219 Maternal care for unspecified type scar from previous cesarean delivery: Secondary | ICD-10-CM

## 2023-04-11 LAB — CBC WITH DIFFERENTIAL/PLATELET
Abs Immature Granulocytes: 0.08 10*3/uL — ABNORMAL HIGH (ref 0.00–0.07)
Basophils Absolute: 0 10*3/uL (ref 0.0–0.1)
Basophils Relative: 0 %
Eosinophils Absolute: 0.3 10*3/uL (ref 0.0–0.5)
Eosinophils Relative: 3 %
HCT: 30.6 % — ABNORMAL LOW (ref 36.0–46.0)
Hemoglobin: 9.9 g/dL — ABNORMAL LOW (ref 12.0–15.0)
Immature Granulocytes: 1 %
Lymphocytes Relative: 13 %
Lymphs Abs: 1.5 10*3/uL (ref 0.7–4.0)
MCH: 27.1 pg (ref 26.0–34.0)
MCHC: 32.4 g/dL (ref 30.0–36.0)
MCV: 83.8 fL (ref 80.0–100.0)
Monocytes Absolute: 0.7 10*3/uL (ref 0.1–1.0)
Monocytes Relative: 6 %
Neutro Abs: 8.9 10*3/uL — ABNORMAL HIGH (ref 1.7–7.7)
Neutrophils Relative %: 77 %
Platelets: 153 10*3/uL (ref 150–400)
RBC: 3.65 MIL/uL — ABNORMAL LOW (ref 3.87–5.11)
RDW: 13.3 % (ref 11.5–15.5)
WBC: 11.6 10*3/uL — ABNORMAL HIGH (ref 4.0–10.5)
nRBC: 0 % (ref 0.0–0.2)

## 2023-04-11 LAB — PROTEIN / CREATININE RATIO, URINE
Creatinine, Urine: 109 mg/dL
Protein Creatinine Ratio: 0.09 mg/mg{Cre} (ref 0.00–0.15)
Total Protein, Urine: 10 mg/dL

## 2023-04-11 LAB — COMPREHENSIVE METABOLIC PANEL
ALT: 12 U/L (ref 0–44)
AST: 22 U/L (ref 15–41)
Albumin: 2.6 g/dL — ABNORMAL LOW (ref 3.5–5.0)
Alkaline Phosphatase: 95 U/L (ref 38–126)
Anion gap: 10 (ref 5–15)
BUN: 6 mg/dL (ref 6–20)
CO2: 22 mmol/L (ref 22–32)
Calcium: 8.9 mg/dL (ref 8.9–10.3)
Chloride: 104 mmol/L (ref 98–111)
Creatinine, Ser: 0.94 mg/dL (ref 0.44–1.00)
GFR, Estimated: >60 mL/min (ref >60)
Glucose, Bld: 102 mg/dL — ABNORMAL HIGH (ref 70–99)
Potassium: 3 mmol/L — ABNORMAL LOW (ref 3.5–5.1)
Sodium: 136 mmol/L (ref 135–145)
Total Bilirubin: 0.6 mg/dL (ref 0.3–1.2)
Total Protein: 6 g/dL — ABNORMAL LOW (ref 6.5–8.1)

## 2023-04-11 MED ORDER — CALCIUM CARBONATE ANTACID 500 MG PO CHEW
1.0000 | CHEWABLE_TABLET | Freq: Once | ORAL | Status: AC
  Administered 2023-04-11: 200 mg via ORAL
  Filled 2023-04-11: qty 1

## 2023-04-11 NOTE — Telephone Encounter (Signed)
Pt called triage reporting a pain under her right rib that feels like gas pain. She states she was seen yesterday and her BP was elevated. I advised her to take Tylenol or Gas x to see if that helps the pain, Also check her BP If it is 130/90 or higher to go be evaluated at the er or if the pain does not go away or worsens.

## 2023-04-11 NOTE — Progress Notes (Signed)
Pt presented to L/D triage with reported constant burning RUQ pain that radiates across her side with intermittent sharp pain. Pt rates pain 4/10. Abdomen is soft and nontender. She reports episode of sharp abdominal pain last night, rated 10/10. Pt reports last BM was yesterday. Pt does not wish to take tums at this time- stating that the pain does not feel like heartburn or gas pain. She also reports episode of elevated BP at home. +2 reflexes, no clonus or atypical edema. She reports no bleeding or LOF and positive fetal movement.  Monitors applied and assessing-initial FHT 155.  Initial BP 141/94. Recheck 131/76-cycling q15 min.  CNM notified and labs to be collected.

## 2023-04-11 NOTE — Final Progress Note (Signed)
OB/Triage Note  Patient ID: Cia Garretson MRN: [id number] DOB/AGE: 27/30/1997 27 y.o.  Subjective  History of Present Illness: The patient is a 27 y.o. female G2P1001 at [redacted]w[redacted]d who presents with concerns about preeclampsia and pain. She has been having dull achey pain to the right up quadrant. Also reports a sharp shooting pain that has happened three times during this pregnancy, last time was last night. It lasted one hour, was in epigastric area, resolved with heat pad, rated pain 8/10.  She took her blood pressure at home and reported it to be 1555/99. She has never had issues with her blood pressure. Also has a scheduled repeat c/s in 13 days.  Past Medical History:  Diagnosis Date   Complication of anesthesia    needs a good amount of anesthesia to get numb   Raynaud's syndrome without gangrene     Past Surgical History:  Procedure Laterality Date   CESAREAN SECTION N/A 04/03/2021   Procedure: CESAREAN SECTION;  Surgeon: Natale Milch, MD;  Location: [facility] ORS;  Service: Obstetrics;  Laterality: N/A;   WISDOM TOOTH EXTRACTION     four;- age 36, 48, 23, 63    No current facility-administered medications on file prior to encounter.   Current Outpatient Medications on File Prior to Encounter  Medication Sig Dispense Refill   ondansetron (ZOFRAN-ODT) 4 MG disintegrating tablet Take 1 tablet (4 mg total) by mouth every 6 (six) hours as needed for nausea. 20 tablet 2   Prenatal Vit-Fe Fumarate-FA (MULTIVITAMIN-PRENATAL) 27-0.8 MG TABS tablet Take 1 tablet by mouth daily at 12 noon.     pseudoephedrine (SUDAFED) 30 MG tablet Take 30 mg by mouth every 4 (four) hours as needed for congestion.      No Known Allergies  Social History   Socioeconomic History   Marital status: Married    Spouse name: Joselyn Glassman   Number of children: 1   Years of education: 13.5   Highest education level: Not on file  Occupational History   Occupation: stay at home mom  Tobacco Use    Smoking status: Every Day    Types: E-cigarettes   Smokeless tobacco: Never   Tobacco comments:    Quit cigarettes 08-28-2020, now vapes   Vaping Use   Vaping Use: Former   Quit date: 08/28/2020   Devices: vape   Substance and Sexual Activity   Alcohol use: Not Currently    Comment: ocassinally/ once a month    Drug use: Never   Sexual activity: Yes    Partners: Male    Birth control/protection: None    Comment: undecided  Other Topics Concern   Not on file  Social History Narrative   Not on file   Social Determinants of Health   Financial Resource Strain: Low Risk  (09/18/2022)   Overall Financial Resource Strain (CARDIA)    Difficulty of Paying Living Expenses: Not very hard  Food Insecurity: No Food Insecurity (09/18/2022)   Hunger Vital Sign    Worried About Running Out of Food in the Last Year: Never true    Ran Out of Food in the Last Year: Never true  Transportation Needs: No Transportation Needs (09/18/2022)   PRAPARE - Administrator, Civil Service (Medical): No    Lack of Transportation (Non-Medical): No  Physical Activity: Inactive (09/18/2022)   Exercise Vital Sign    Days of Exercise per Week: 0 days    Minutes of Exercise per Session: 0 min  Stress: No  Stress Concern Present (09/18/2022)   [employer] of Occupational Health - Occupational Stress Questionnaire    Feeling of Stress : Not at all  Social Connections: Moderately Integrated (09/18/2022)   Social Connection and Isolation Panel [NHANES]    Frequency of Communication with Friends and Family: More than three times a week    Frequency of Social Gatherings with Friends and Family: Three times a week    Attends Religious Services: More than 4 times per year    Active Member of Clubs or Organizations: No    Attends Banker Meetings: Never    Marital Status: Married  Catering manager Violence: Not At Risk (09/18/2022)   Humiliation, Afraid, Rape, and Kick questionnaire    Fear  of Current or Ex-Partner: No    Emotionally Abused: No    Physically Abused: No    Sexually Abused: No    Family History  Problem Relation Age of Onset   Neurofibromatosis Mother    Anemia Mother    Neurofibromatosis Sister    Neurofibromatosis Brother    Neurofibromatosis Brother    Schizophrenia Maternal Grandfather    Kidney disease Paternal Grandmother    Cancer Paternal Grandfather 26       colon     ROS    Objective  Physical Exam: BP (!) 142/84   Pulse (!) 106   Temp 98.5 F (36.9 C) (Oral)   Resp 18   Ht  (1.651 m)   Wt 99.8 kg   LMP 07/25/2022 (Exact Date)   BMI 36.61 kg/m   OBGyn Exam  FHT 150, mod variability, pos accels, no decels Toco no contractions detected  Significant Findings/ Diagnostic Studies: PC ratio, LFTs and CBC WNL except showing anemia. Labs negative for preeclampsia.   Hospital Course: The patient was admitted to [facility] Triage for observation. While in triage she had two mildly elevated Bps and five normotensive Bps. Had RNST. Labs were reassuring that patient does not have preeclampsia. Unsure of exact cause of pain although not having excruciating pain currently.  Assessment: 27 y.o. female G2P1001 at [redacted]w[redacted]d  Two mildly elevated blood pressures Preclampsia labs WNL anemia  Plan: Discharge home Follow up at [facility] on Wednesday Encouraged to take additional iron to help with anemia Teaching: Preeclampsia warning s/sx  Discharge Instructions     Discharge activity:  No Restrictions   Complete by: As directed    LABOR:  When conractions begin, you should start to time them from the beginning of one contraction to the beginning  of the next.  When contractions are 5 - 10 minutes apart or less and have been regular for at least an hour, you should call your health care provider.   Complete by: As directed    Notify physician for bleeding from the vagina   Complete by: As directed    Notify physician for blurring of vision or spots  before the eyes   Complete by: As directed    Notify physician for chills or fever   Complete by: As directed    Notify physician for fainting spells, "black outs" or loss of consciousness   Complete by: As directed    Notify physician for increase in vaginal discharge   Complete by: As directed    Notify physician for leaking of fluid   Complete by: As directed    Notify physician for pain or burning when urinating   Complete by: As directed    Notify physician for pelvic pressure (sudden  increase)   Complete by: As directed    Notify physician for severe or continued nausea or vomiting   Complete by: As directed    Notify physician for sudden gushing of fluid from the vagina (with or without continued leaking)   Complete by: As directed    Notify physician for sudden, constant, or occasional abdominal pain   Complete by: As directed    Notify physician if baby moving less than usual   Complete by: As directed       Allergies as of 04/11/2023   No Known Allergies      Medication List     TAKE these medications    multivitamin-prenatal 27-0.8 MG Tabs tablet Take 1 tablet by mouth daily at 12 noon.   ondansetron 4 MG disintegrating tablet Commonly known as: ZOFRAN-ODT Take 1 tablet (4 mg total) by mouth every 6 (six) hours as needed for nausea.   pseudoephedrine 30 MG tablet Commonly known as: SUDAFED Take 30 mg by mouth every 4 (four) hours as needed for congestion.         Total time spent taking care of this patient: 45 minutes  Signed: Raeford Razor CNM, FNP 04/11/2023, 9:18 PM

## 2023-04-12 ENCOUNTER — Encounter: Payer: Self-pay | Admitting: Certified Nurse Midwife

## 2023-04-14 ENCOUNTER — Other Ambulatory Visit: Payer: Self-pay

## 2023-04-14 ENCOUNTER — Encounter: Payer: Self-pay | Admitting: Licensed Practical Nurse

## 2023-04-14 DIAGNOSIS — O219 Vomiting of pregnancy, unspecified: Secondary | ICD-10-CM

## 2023-04-14 MED ORDER — ONDANSETRON 4 MG PO TBDP
4.0000 mg | ORAL_TABLET | Freq: Four times a day (QID) | ORAL | 0 refills | Status: DC | PRN
Start: 2023-04-14 — End: 2023-04-30

## 2023-04-16 ENCOUNTER — Ambulatory Visit (INDEPENDENT_AMBULATORY_CARE_PROVIDER_SITE_OTHER): Payer: Medicaid Other | Admitting: Obstetrics and Gynecology

## 2023-04-16 ENCOUNTER — Encounter: Payer: Self-pay | Admitting: Obstetrics and Gynecology

## 2023-04-16 ENCOUNTER — Encounter: Payer: Medicaid Other | Admitting: Obstetrics

## 2023-04-16 VITALS — BP 125/84 | HR 110 | Wt 228.6 lb

## 2023-04-16 DIAGNOSIS — O133 Gestational [pregnancy-induced] hypertension without significant proteinuria, third trimester: Secondary | ICD-10-CM

## 2023-04-16 DIAGNOSIS — Z3A37 37 weeks gestation of pregnancy: Secondary | ICD-10-CM

## 2023-04-16 DIAGNOSIS — Z01812 Encounter for preprocedural laboratory examination: Secondary | ICD-10-CM

## 2023-04-16 DIAGNOSIS — O34219 Maternal care for unspecified type scar from previous cesarean delivery: Secondary | ICD-10-CM

## 2023-04-16 DIAGNOSIS — Z3483 Encounter for supervision of other normal pregnancy, third trimester: Secondary | ICD-10-CM

## 2023-04-16 DIAGNOSIS — E669 Obesity, unspecified: Secondary | ICD-10-CM

## 2023-04-16 DIAGNOSIS — O99213 Obesity complicating pregnancy, third trimester: Secondary | ICD-10-CM

## 2023-04-16 LAB — POCT URINALYSIS DIPSTICK OB
Bilirubin, UA: NEGATIVE
Blood, UA: NEGATIVE
Glucose, UA: NEGATIVE
Ketones, UA: NEGATIVE
Leukocytes, UA: NEGATIVE
Nitrite, UA: NEGATIVE
POC,PROTEIN,UA: NEGATIVE
Spec Grav, UA: <=1.005 — AB (ref 1.010–1.025)
Urobilinogen, UA: 0.2 E.U./dL
pH, UA: >=9 — AB (ref 5.0–8.0)

## 2023-04-16 NOTE — Progress Notes (Signed)
ROB: Patient is a 27 y.o. G2P1001 at [redacted]w[redacted]d who presents for routine OB care.  Pregnancy is complicated by Raynaud's phenomenon without gangrene; Tobacco use disorder; Previous cesarean delivery affecting pregnancy; Obesity in pregnancy.  Patient has complaints of having lots of pressure, and it makes it difficult for her to walk. She was evaluated at L&D on Friday for elevated BP, she was tested for pre eclampsia and it was negative. Discussed borderline elevated BPs, likely with GHTN as BPs have been mildly elevated over past several visitts. Notes good fetal movement, denies contractions or LOF.  Discussed upcoming C-section, pre-op done today. All questions answered. 36 week cultures negative. RTC in 1 week, for NST for GHTN.

## 2023-04-17 ENCOUNTER — Encounter
Admission: RE | Admit: 2023-04-17 | Discharge: 2023-04-17 | Disposition: A | Discharge status: Home / Self Care | Payer: Medicaid Other | Source: Ambulatory Visit | Attending: Obstetrics and Gynecology | Admitting: Obstetrics and Gynecology

## 2023-04-17 DIAGNOSIS — E876 Hypokalemia: Secondary | ICD-10-CM

## 2023-04-17 DIAGNOSIS — Z01812 Encounter for preprocedural laboratory examination: Secondary | ICD-10-CM

## 2023-04-17 HISTORY — DX: Anemia, unspecified: D64.9

## 2023-04-17 HISTORY — DX: Family history of other specified conditions: Z84.89

## 2023-04-17 HISTORY — DX: Elevated blood-pressure reading, without diagnosis of hypertension: R03.0

## 2023-04-17 HISTORY — DX: Gastro-esophageal reflux disease without esophagitis: K21.9

## 2023-04-17 NOTE — Patient Instructions (Addendum)
Your procedure is scheduled on:04-24-23 Thursday  Arrival Time: 5:30 AM. Please call Labor and Delivery with any questions [phone].  Arrival: If your arrival time is prior to 6:00 am, please enter through the Emergency Room Entrance and you will be directed to Labor and Delivery. If your arrival time is 6:00 am or later, please enter the Medical Mall and follow the greeter's instructions.  REMEMBER: Instructions that are not followed completely may result in serious medical risk, up to and including death; or upon the discretion of your surgeon and anesthesiologist your surgery may need to be rescheduled.  Do not eat food OR drink liquids after midnight the night before surgery.  No gum chewing or hard candies.  In addition, your doctor has ordered for you to drink the provided  Ensure Pre-Surgery Clear Carbohydrate Drink  Drinking this carbohydrate drink up to two hours before surgery helps to reduce insulin resistance and improve patient outcomes. Please complete drinking 2 hours prior to scheduled arrival time.  One week prior to surgery: Stop Anti-inflammatories (NSAIDS) such as Advil, Aleve, Ibuprofen, Motrin, Naproxen, Naprosyn and Aspirin based products such as Excedrin, Goody's Powder, BC Powder. You may however, take Tylenol if needed for pain up until the day of surgery.  TAKE ONLY THESE MEDICATIONS THE MORNING OF SURGERY WITH A SIP OF WATER: -You may take ondansetron (ZOFRAN-ODT) if needed for nausea  No Alcohol for 24 hours before or after surgery.  No Smoking including e-cigarettes for 24 hours prior to surgery.  No chewable tobacco products for at least 6 hours prior to surgery.  No nicotine patches on the day of surgery.  Do not use any "recreational" drugs for at least a week prior to your surgery.  Please be advised that the combination of cocaine and anesthesia may have negative outcomes, up to and including death. If you test positive for cocaine, your surgery  will be cancelled.  On the morning of surgery brush your teeth with toothpaste and water, you may rinse your mouth with mouthwash if you wish. Do not swallow any toothpaste or mouthwash.  Use CHG soap as directed on instruction sheet.  Do not wear jewelry, make-up, hairpins, clips or nail polish.  Do not wear lotions, powders, or perfumes.   Do not shave body hair from the neck down 48 hours before surgery.  Contact lenses, hearing aids and dentures may not be worn into surgery.  Do not bring valuables to the hospital. [facility] is not responsible for any missing/lost belongings or valuables.   Notify your doctor if there is any change in your medical condition (cold, fever, infection).  Wear comfortable clothing (specific to your surgery type) to the hospital.  After surgery, you can help prevent lung complications by doing breathing exercises.  Take deep breaths and cough every 1-2 hours. Your doctor may order a device called an Incentive Spirometer to help you take deep breaths. When coughing or sneezing, hold a pillow firmly against your incision with both hands. This is called "splinting." Doing this helps protect your incision. It also decreases belly discomfort.  Please call the Pre-admissions Testing Dept. at [phone] if you have any questions about these instructions.  Surgery Visitation Policy:  Visitor Passes   All visitors, including children, need an identification sticker when visiting. These stickers must be worn where they can be seen.   Labor & Delivery  Laboring women may have one designated support person and two other visitors of any age visit. The support  person must remain the same. The visitors may switch with other visitors. Visitation is permitted 24 hours per day. The designated support person or a visitor over the age of 16 may sleep overnight in the patient's room. A doula registered with [facility] for labor and delivery support is not  considered a visitor. Doulas not registered with  are considered visitors.  Mother Baby Unit, OB Specialty and Gynecological Care  A designated support person and three visitors of any age may visit. The three visitors may switch out. The designated support person or a visitor age 44 or older may stay overnight in the room. During the postpartum period (up to 6 weeks), if the mother is the patient, she can have her newborn stay with her if there is another support person present who can be responsible for the baby.       Preparing for Surgery with CHLORHEXIDINE GLUCONATE (CHG) Soap  Chlorhexidine Gluconate (CHG) Soap  o An antiseptic cleaner that kills germs and bonds with the skin to continue killing germs even after washing  o Used for showering the night before surgery and morning of surgery  Before surgery, you can play an important role by reducing the number of germs on your skin.  CHG (Chlorhexidine gluconate) soap is an antiseptic cleanser which kills germs and bonds with the skin to continue killing germs even after washing.  Please do not use if you have an allergy to CHG or antibacterial soaps. If your skin becomes reddened/irritated stop using the CHG.  1. Shower the NIGHT BEFORE SURGERY and the MORNING OF SURGERY with CHG soap.  2. If you choose to wash your hair, wash your hair first as usual with your normal shampoo.  3. After shampooing, rinse your hair and body thoroughly to remove the shampoo.  4. Use CHG as you would any other liquid soap. You can apply CHG directly to the skin and wash gently with a scrungie or a clean washcloth.  5. Apply the CHG soap to your body only from the neck down. Do not use on open wounds or open sores. Avoid contact with your eyes, ears, mouth, and genitals (private parts). Wash face and genitals (private parts) with your normal soap.  6. Wash thoroughly, paying special attention to the area where your surgery will be  performed.  7. Thoroughly rinse your body with warm water.  8. Do not shower/wash with your normal soap after using and rinsing off the CHG soap.  9. Pat yourself dry with a clean towel.  10. Wear clean pajamas to bed the night before surgery.  12. Place clean sheets on your bed the night of your first shower and do not sleep with pets.  13. Shower again with the CHG soap on the day of surgery prior to arriving at the hospital.  14. Do not apply any deodorants/lotions/powders.  15. Please wear clean clothes to the hospital.   How to Use an Incentive Spirometer An incentive spirometer is a tool that measures how well you are filling your lungs with each breath. Learning to take long, deep breaths using this tool can help you keep your lungs clear and active. This may help to reverse or lessen your chance of developing breathing (pulmonary) problems, especially infection. You may be asked to use a spirometer: After a surgery. If you have a lung problem or a history of smoking. After a long period of time when you have been unable to move or be active.  If the spirometer includes an indicator to show the highest number that you have reached, your health care provider or respiratory therapist will help you set a goal. Keep a log of your progress as told by your health care provider. What are the risks? Breathing too quickly may cause dizziness or cause you to pass out. Take your time so you do not get dizzy or light-headed. If you are in pain, you may need to take pain medicine before doing incentive spirometry. It is harder to take a deep breath if you are having pain. How to use your incentive spirometer  Sit up on the edge of your bed or on a chair. Hold the incentive spirometer so that it is in an upright position. Before you use the spirometer, breathe out normally. Place the mouthpiece in your mouth. Make sure your lips are closed tightly around it. Breathe in slowly and as deeply  as you can through your mouth, causing the piston or the ball to rise toward the top of the chamber. Hold your breath for 3-5 seconds, or for as long as possible. If the spirometer includes a coach indicator, use this to guide you in breathing. Slow down your breathing if the indicator goes above the marked areas. Remove the mouthpiece from your mouth and breathe out normally. The piston or ball will return to the bottom of the chamber. Rest for a few seconds, then repeat the steps 10 or more times. Take your time and take a few normal breaths between deep breaths so that you do not get dizzy or light-headed. Do this every 1-2 hours when you are awake. If the spirometer includes a goal marker to show the highest number you have reached (best effort), use this as a goal to work toward during each repetition. After each set of 10 deep breaths, cough a few times. This will help to make sure that your lungs are clear. If you have an incision on your chest or abdomen from surgery, place a pillow or a rolled-up towel firmly against the incision when you cough. This can help to reduce pain while taking deep breaths and coughing. General tips When you are able to get out of bed: Walk around often. Continue to take deep breaths and cough in order to clear your lungs. Keep using the incentive spirometer until your health care provider says it is okay to stop using it. If you have been in the hospital, you may be told to keep using the spirometer at home. Contact a health care provider if: You are having difficulty using the spirometer. You have trouble using the spirometer as often as instructed. Your pain medicine is not giving enough relief for you to use the spirometer as told. You have a fever. Get help right away if: You develop shortness of breath. You develop a cough with bloody mucus from the lungs. You have fluid or blood coming from an incision site after you cough. Summary An incentive  spirometer is a tool that can help you learn to take long, deep breaths to keep your lungs clear and active. You may be asked to use a spirometer after a surgery, if you have a lung problem or a history of smoking, or if you have been inactive for a long period of time. Use your incentive spirometer as instructed every 1-2 hours while you are awake. If you have an incision on your chest or abdomen, place a pillow or a rolled-up towel firmly against your  incision when you cough. This will help to reduce pain. Get help right away if you have shortness of breath, you cough up bloody mucus, or blood comes from your incision when you cough. This information is not intended to replace advice given to you by your health care provider. Make sure you discuss any questions you have with your health care provider. Document Revised: 02/28/2020 Document Reviewed: 02/28/2020 Elsevier Patient Education  2023 [employer].

## 2023-04-19 ENCOUNTER — Other Ambulatory Visit: Payer: Self-pay

## 2023-04-19 ENCOUNTER — Encounter: Payer: Self-pay | Admitting: Obstetrics and Gynecology

## 2023-04-19 ENCOUNTER — Observation Stay
Admission: EM | Admit: 2023-04-19 | Discharge: 2023-04-19 | Disposition: A | Discharge status: Home / Self Care | Payer: Medicaid Other | Attending: Certified Nurse Midwife | Admitting: Certified Nurse Midwife

## 2023-04-19 DIAGNOSIS — O26893 Other specified pregnancy related conditions, third trimester: Principal | ICD-10-CM | POA: Insufficient documentation

## 2023-04-19 DIAGNOSIS — R200 Anesthesia of skin: Secondary | ICD-10-CM | POA: Diagnosis not present

## 2023-04-19 DIAGNOSIS — Z3A38 38 weeks gestation of pregnancy: Secondary | ICD-10-CM | POA: Insufficient documentation

## 2023-04-19 LAB — CBC
HCT: 29.1 % — ABNORMAL LOW (ref 36.0–46.0)
Hemoglobin: 9.2 g/dL — ABNORMAL LOW (ref 12.0–15.0)
MCH: 26.7 pg (ref 26.0–34.0)
MCHC: 31.6 g/dL (ref 30.0–36.0)
MCV: 84.3 fL (ref 80.0–100.0)
Platelets: 162 10*3/uL (ref 150–400)
RBC: 3.45 MIL/uL — ABNORMAL LOW (ref 3.87–5.11)
RDW: 13.5 % (ref 11.5–15.5)
WBC: 11.3 10*3/uL — ABNORMAL HIGH (ref 4.0–10.5)
nRBC: 0 % (ref 0.0–0.2)

## 2023-04-19 LAB — COMPREHENSIVE METABOLIC PANEL
ALT: 10 U/L (ref 0–44)
AST: 19 U/L (ref 15–41)
Albumin: 2.5 g/dL — ABNORMAL LOW (ref 3.5–5.0)
Alkaline Phosphatase: 95 U/L (ref 38–126)
Anion gap: 6 (ref 5–15)
BUN: 6 mg/dL (ref 6–20)
CO2: 26 mmol/L (ref 22–32)
Calcium: 7.9 mg/dL — ABNORMAL LOW (ref 8.9–10.3)
Chloride: 102 mmol/L (ref 98–111)
Creatinine, Ser: 0.81 mg/dL (ref 0.44–1.00)
GFR, Estimated: >60 mL/min (ref >60)
Glucose, Bld: 110 mg/dL — ABNORMAL HIGH (ref 70–99)
Potassium: 3.5 mmol/L (ref 3.5–5.1)
Sodium: 134 mmol/L — ABNORMAL LOW (ref 135–145)
Total Bilirubin: 0.5 mg/dL (ref 0.3–1.2)
Total Protein: 5.8 g/dL — ABNORMAL LOW (ref 6.5–8.1)

## 2023-04-19 LAB — PROTEIN / CREATININE RATIO, URINE
Creatinine, Urine: 62 mg/dL
Protein Creatinine Ratio: 0.11 mg/mg{Cre} (ref 0.00–0.15)
Total Protein, Urine: 7 mg/dL

## 2023-04-19 NOTE — OB Triage Note (Signed)
Patient presents to OBS 3 with abdominal pain that she rates a 10/10. Pt states that pain comes and goes every once in a while and wakes her from her sleep. Pt also reports numbness that started started this morning (04/19/23) at 0650. Pt stated that slowly throughout the day the numbness started to spread and has remained throughout her whole body since lunch time. Pt doesn't report any laboring symptoms (leaking of fluid,bloody show).

## 2023-04-19 NOTE — OB Triage Note (Signed)
   L&D OB Triage Note  SUBJECTIVE Kathryn Estrada is a 27 y.o. G2P1001 female at [redacted]w[redacted]d, EDD Estimated Date of Delivery: 05/01/23 who presented to triage with complaints of numbness in her hands and feet as well as numbness of her lips/ mouth. She denies vaginal leaking, vaginal bleeding, and feels good movement. She has occasional uncomfortable contraction.   OB History  Gravida Para Term Preterm AB Living  2 1 1  0 0 1  SAB IAB Ectopic Multiple Live Births  0 0 0 0 1    # Outcome Date GA Lbr Len/2nd Weight Sex Delivery Anes PTL Lv  2 Current           1 Term 04/03/21 [redacted]w[redacted]d  3500 g F CS-Vac Spinal  LIV     Name: Willingham,GIRL [name]     Apgar1: 8  Apgar5: 9    Medications Prior to Admission  Medication Sig Dispense Refill Last Dose   calcium carbonate (TUMS - DOSED IN MG ELEMENTAL CALCIUM) 500 MG chewable tablet Chew 1 tablet by mouth as needed for indigestion or heartburn.   Past Week at 2200   ondansetron (ZOFRAN-ODT) 4 MG disintegrating tablet Take 1 tablet (4 mg total) by mouth every 6 (six) hours as needed for nausea. 20 tablet 0 04/18/2023 at 2200   Prenatal Vit-Fe Fumarate-FA (MULTIVITAMIN-PRENATAL) 27-0.8 MG TABS tablet Take 1 tablet by mouth daily at 12 noon.   Past Week at 2200     OBJECTIVE  Nursing Evaluation:   BP 116/79   Pulse (!) 121   LMP 07/25/2022 (Exact Date)   SpO2 99%    Findings:   Negative testing for pre eclampsia     She has no facial drop, no difficulty with speech, negative neurological exam.     NST was performed and has been reviewed by me.  NST INTERPRETATION: Category I   Baseline 130 Accelerations present Decelerations absent Moderate variability  Toco occasional contaction    ASSESSMENT Impression:  1.  Pregnancy:  G2P1001 at 104w2d , EDD Estimated Date of Delivery: 05/01/23 2.  Reassuring fetal and maternal status 3.  Carpel tunnel syndrome in pregnancy, never compression in pregnancy  PLAN 1. Current condition and above findings  reviewed.  Reassuring fetal and maternal condition. 2. Discharge home with standard labor precautions given to return to L&D or call the office for problems. 3. Continue routine prenatal care.    Doreene Burke, CNM

## 2023-04-21 DIAGNOSIS — O133 Gestational [pregnancy-induced] hypertension without significant proteinuria, third trimester: Secondary | ICD-10-CM | POA: Insufficient documentation

## 2023-04-21 DIAGNOSIS — Z3A38 38 weeks gestation of pregnancy: Secondary | ICD-10-CM | POA: Insufficient documentation

## 2023-04-21 NOTE — Progress Notes (Unsigned)
    NURSE VISIT NOTE  Subjective:    Patient ID: Kathryn Estrada, female    DOB: 1996-05-30, 27 y.o.   MRN: [id number]  HPI  Patient is a 27 y.o. G54P1001 female who presents for fetal monitoring per order from Hildred Laser, MD.   Objective:    LMP 07/25/2022 (Exact Date)  Estimated Date of Delivery: 05/01/23  [redacted]w[redacted]d  Fetus A Non-Stress Test Interpretation for 04/21/23  Indication:  Gestational Hypertension            Assessment:   1. Gestational hypertension, third trimester   2. [redacted] weeks gestation of pregnancy      Plan:   Results reviewed and discussed with patient by  Guadlupe Spanish, CNM.     Rocco Serene, LPN

## 2023-04-21 NOTE — Patient Instructions (Signed)

## 2023-04-22 ENCOUNTER — Ambulatory Visit (INDEPENDENT_AMBULATORY_CARE_PROVIDER_SITE_OTHER): Payer: Medicaid Other

## 2023-04-22 ENCOUNTER — Encounter: Payer: Self-pay | Admitting: Obstetrics

## 2023-04-22 ENCOUNTER — Encounter
Admission: RE | Admit: 2023-04-22 | Discharge: 2023-04-22 | Disposition: A | Discharge status: Home / Self Care | Payer: Medicaid Other | Source: Ambulatory Visit | Attending: Obstetrics and Gynecology | Admitting: Obstetrics and Gynecology

## 2023-04-22 ENCOUNTER — Encounter: Payer: Medicaid Other | Admitting: Obstetrics

## 2023-04-22 ENCOUNTER — Ambulatory Visit (INDEPENDENT_AMBULATORY_CARE_PROVIDER_SITE_OTHER): Payer: Medicaid Other | Admitting: Obstetrics

## 2023-04-22 VITALS — BP 127/84 | HR 130 | Ht 65.0 in | Wt 226.5 lb

## 2023-04-22 VITALS — BP 127/84 | HR 130 | Wt 226.0 lb

## 2023-04-22 DIAGNOSIS — O133 Gestational [pregnancy-induced] hypertension without significant proteinuria, third trimester: Secondary | ICD-10-CM | POA: Diagnosis not present

## 2023-04-22 DIAGNOSIS — Z3A38 38 weeks gestation of pregnancy: Secondary | ICD-10-CM

## 2023-04-22 DIAGNOSIS — E876 Hypokalemia: Secondary | ICD-10-CM | POA: Diagnosis not present

## 2023-04-22 DIAGNOSIS — K829 Disease of gallbladder, unspecified: Secondary | ICD-10-CM

## 2023-04-22 DIAGNOSIS — Z01812 Encounter for preprocedural laboratory examination: Secondary | ICD-10-CM | POA: Diagnosis present

## 2023-04-22 DIAGNOSIS — O99613 Diseases of the digestive system complicating pregnancy, third trimester: Secondary | ICD-10-CM

## 2023-04-22 LAB — CBC
HCT: 32.7 % — ABNORMAL LOW (ref 36.0–46.0)
Hemoglobin: 10.5 g/dL — ABNORMAL LOW (ref 12.0–15.0)
MCH: 27 pg (ref 26.0–34.0)
MCHC: 32.1 g/dL (ref 30.0–36.0)
MCV: 84.1 fL (ref 80.0–100.0)
Platelets: 188 10*3/uL (ref 150–400)
RBC: 3.89 MIL/uL (ref 3.87–5.11)
RDW: 13.8 % (ref 11.5–15.5)
WBC: 8.8 10*3/uL (ref 4.0–10.5)
nRBC: 0 % (ref 0.0–0.2)

## 2023-04-22 LAB — BASIC METABOLIC PANEL
Anion gap: 7 (ref 5–15)
BUN: <5 mg/dL — ABNORMAL LOW (ref 6–20)
CO2: 20 mmol/L — ABNORMAL LOW (ref 22–32)
Calcium: 7.6 mg/dL — ABNORMAL LOW (ref 8.9–10.3)
Chloride: 107 mmol/L (ref 98–111)
Creatinine, Ser: 0.61 mg/dL (ref 0.44–1.00)
GFR, Estimated: >60 mL/min (ref >60)
Glucose, Bld: 112 mg/dL — ABNORMAL HIGH (ref 70–99)
Potassium: 3.4 mmol/L — ABNORMAL LOW (ref 3.5–5.1)
Sodium: 134 mmol/L — ABNORMAL LOW (ref 135–145)

## 2023-04-22 LAB — POCT URINALYSIS DIPSTICK OB
Bilirubin, UA: NEGATIVE
Glucose, UA: NEGATIVE
Ketones, UA: NEGATIVE
Leukocytes, UA: NEGATIVE
Nitrite, UA: NEGATIVE
POC,PROTEIN,UA: NEGATIVE
Spec Grav, UA: 1.015 (ref 1.010–1.025)
Urobilinogen, UA: 0.2 E.U./dL
pH, UA: 6.5 (ref 5.0–8.0)

## 2023-04-22 LAB — TYPE AND SCREEN
ABO/RH(D): O POS
Antibody Screen: NEGATIVE
Extend sample reason: UNDETERMINED

## 2023-04-22 NOTE — Progress Notes (Signed)
ROB at [redacted]w[redacted]d. Good fetal movement. RNST today (see note). Janalyn has been having some contractions in her back but nothing regular. Denies LOF and vaginal bleeding. She has had two gallbladder attacks recently. Gallbladder diet given and referral to GI placed. Discussed that she may need surgery after the baby is born if medical management does not improve her symptoms. CS scheduled for 04/24/23. Karmella knows when and where to go. Schedule incision check, 2-week video visit, and 6-week PP visit.   Glenetta Borg, CNM

## 2023-04-23 LAB — RPR: RPR Ser Ql: NONREACTIVE

## 2023-04-24 ENCOUNTER — Inpatient Hospital Stay: Payer: Medicaid Other | Admitting: Urgent Care

## 2023-04-24 ENCOUNTER — Encounter
Admission: RE | Disposition: A | Discharge status: Home / Self Care | Payer: Self-pay | Source: Home / Self Care | Attending: Obstetrics and Gynecology

## 2023-04-24 ENCOUNTER — Other Ambulatory Visit: Payer: Self-pay

## 2023-04-24 ENCOUNTER — Inpatient Hospital Stay
Admission: RE | Admit: 2023-04-24 | Discharge: 2023-04-26 | DRG: 788 | Disposition: A | Discharge status: Home / Self Care | Payer: Medicaid Other | Attending: Obstetrics and Gynecology | Admitting: Obstetrics and Gynecology

## 2023-04-24 ENCOUNTER — Encounter: Payer: Self-pay | Admitting: Obstetrics

## 2023-04-24 ENCOUNTER — Encounter: Payer: Self-pay | Admitting: Obstetrics and Gynecology

## 2023-04-24 DIAGNOSIS — Z3A39 39 weeks gestation of pregnancy: Secondary | ICD-10-CM

## 2023-04-24 DIAGNOSIS — O9902 Anemia complicating childbirth: Secondary | ICD-10-CM | POA: Diagnosis present

## 2023-04-24 DIAGNOSIS — Z3483 Encounter for supervision of other normal pregnancy, third trimester: Principal | ICD-10-CM

## 2023-04-24 DIAGNOSIS — O99214 Obesity complicating childbirth: Secondary | ICD-10-CM | POA: Diagnosis present

## 2023-04-24 DIAGNOSIS — O34211 Maternal care for low transverse scar from previous cesarean delivery: Secondary | ICD-10-CM | POA: Diagnosis present

## 2023-04-24 DIAGNOSIS — O99334 Smoking (tobacco) complicating childbirth: Secondary | ICD-10-CM | POA: Diagnosis present

## 2023-04-24 DIAGNOSIS — O134 Gestational [pregnancy-induced] hypertension without significant proteinuria, complicating childbirth: Secondary | ICD-10-CM | POA: Diagnosis present

## 2023-04-24 DIAGNOSIS — O34219 Maternal care for unspecified type scar from previous cesarean delivery: Secondary | ICD-10-CM | POA: Diagnosis present

## 2023-04-24 DIAGNOSIS — D62 Acute posthemorrhagic anemia: Secondary | ICD-10-CM

## 2023-04-24 DIAGNOSIS — F17290 Nicotine dependence, other tobacco product, uncomplicated: Secondary | ICD-10-CM | POA: Diagnosis present

## 2023-04-24 SURGERY — Surgical Case
Anesthesia: Spinal

## 2023-04-24 MED ORDER — CHLORHEXIDINE GLUCONATE 0.12 % MT SOLN
15.0000 mL | Freq: Once | OROMUCOSAL | Status: AC
  Administered 2023-04-24: 15 mL via OROMUCOSAL
  Filled 2023-04-24: qty 15

## 2023-04-24 MED ORDER — OXYTOCIN-SODIUM CHLORIDE 30-0.9 UT/500ML-% IV SOLN
2.5000 [IU]/h | INTRAVENOUS | Status: DC
  Administered 2023-04-24: 2.5 [IU]/h via INTRAVENOUS
  Filled 2023-04-24: qty 500

## 2023-04-24 MED ORDER — KETOROLAC TROMETHAMINE 30 MG/ML IJ SOLN: 30.0000 mg | Freq: Four times a day (QID) | INTRAMUSCULAR | Status: DC

## 2023-04-24 MED ORDER — FENTANYL CITRATE (PF) 100 MCG/2ML IJ SOLN
INTRAMUSCULAR | Status: DC | PRN
  Administered 2023-04-24: 15 ug via INTRATHECAL

## 2023-04-24 MED ORDER — OXYCODONE HCL 5 MG/5ML PO SOLN: 5.0000 mg | Freq: Once | ORAL | Status: DC | PRN

## 2023-04-24 MED ORDER — DIPHENHYDRAMINE HCL 25 MG PO CAPS: 25.0000 mg | ORAL_CAPSULE | ORAL | Status: DC | PRN

## 2023-04-24 MED ORDER — POVIDONE-IODINE 10 % EX SWAB
2.0000 | Freq: Once | CUTANEOUS | Status: AC
  Administered 2023-04-24: 2 via TOPICAL

## 2023-04-24 MED ORDER — ORAL CARE MOUTH RINSE: 15.0000 mL | Freq: Once | OROMUCOSAL | Status: AC

## 2023-04-24 MED ORDER — KETOROLAC TROMETHAMINE 30 MG/ML IJ SOLN
30.0000 mg | Freq: Four times a day (QID) | INTRAMUSCULAR | Status: AC
  Administered 2023-04-24 – 2023-04-25 (×4): 30 mg via INTRAVENOUS
  Filled 2023-04-24 (×4): qty 1

## 2023-04-24 MED ORDER — LACTATED RINGERS IV SOLN: Freq: Once | INTRAVENOUS | Status: AC

## 2023-04-24 MED ORDER — NALOXONE HCL 4 MG/10ML IJ SOLN: 1.0000 ug/kg/h | INTRAVENOUS | Status: DC | PRN

## 2023-04-24 MED ORDER — FENTANYL CITRATE PF 50 MCG/ML IJ SOSY: 50.0000 ug | PREFILLED_SYRINGE | INTRAMUSCULAR | Status: DC | PRN

## 2023-04-24 MED ORDER — ACETAMINOPHEN 500 MG PO TABS
1000.0000 mg | ORAL_TABLET | Freq: Four times a day (QID) | ORAL | Status: DC
  Administered 2023-04-24 – 2023-04-26 (×8): 1000 mg via ORAL
  Filled 2023-04-24 (×9): qty 2

## 2023-04-24 MED ORDER — SOD CITRATE-CITRIC ACID 500-334 MG/5ML PO SOLN
ORAL | Status: AC
  Administered 2023-04-24: 30 mL via ORAL
  Filled 2023-04-24: qty 15

## 2023-04-24 MED ORDER — PRENATAL MULTIVITAMIN CH
1.0000 | ORAL_TABLET | Freq: Every day | ORAL | Status: DC
  Administered 2023-04-24 – 2023-04-25 (×2): 1 via ORAL
  Filled 2023-04-24 (×2): qty 1

## 2023-04-24 MED ORDER — PHENYLEPHRINE 80 MCG/ML (10ML) SYRINGE FOR IV PUSH (FOR BLOOD PRESSURE SUPPORT)
PREFILLED_SYRINGE | INTRAVENOUS | Status: AC
  Filled 2023-04-24: qty 10

## 2023-04-24 MED ORDER — PHENYLEPHRINE HCL (PRESSORS) 10 MG/ML IV SOLN
INTRAVENOUS | Status: DC | PRN
  Administered 2023-04-24 (×2): 80 ug via INTRAVENOUS

## 2023-04-24 MED ORDER — SIMETHICONE 80 MG PO CHEW
80.0000 mg | CHEWABLE_TABLET | Freq: Three times a day (TID) | ORAL | Status: DC
  Administered 2023-04-24 – 2023-04-26 (×6): 80 mg via ORAL
  Filled 2023-04-24 (×6): qty 1

## 2023-04-24 MED ORDER — LACTATED RINGERS IV SOLN: INTRAVENOUS | Status: DC

## 2023-04-24 MED ORDER — ZOLPIDEM TARTRATE 5 MG PO TABS: 5.0000 mg | ORAL_TABLET | Freq: Every evening | ORAL | Status: DC | PRN

## 2023-04-24 MED ORDER — FAMOTIDINE 20 MG PO TABS
20.0000 mg | ORAL_TABLET | Freq: Once | ORAL | Status: AC
  Administered 2023-04-24: 20 mg via ORAL
  Filled 2023-04-24: qty 1

## 2023-04-24 MED ORDER — MORPHINE SULFATE (PF) 0.5 MG/ML IJ SOLN
INTRAMUSCULAR | Status: AC
  Filled 2023-04-24: qty 10

## 2023-04-24 MED ORDER — METHYLERGONOVINE MALEATE 0.2 MG PO TABS: 0.2000 mg | ORAL_TABLET | ORAL | Status: DC | PRN

## 2023-04-24 MED ORDER — OXYTOCIN-SODIUM CHLORIDE 30-0.9 UT/500ML-% IV SOLN
INTRAVENOUS | Status: AC
  Filled 2023-04-24: qty 500

## 2023-04-24 MED ORDER — MENTHOL 3 MG MT LOZG: 1.0000 | LOZENGE | OROMUCOSAL | Status: DC | PRN

## 2023-04-24 MED ORDER — SENNOSIDES-DOCUSATE SODIUM 8.6-50 MG PO TABS
2.0000 | ORAL_TABLET | Freq: Every day | ORAL | Status: DC
  Administered 2023-04-25 – 2023-04-26 (×2): 2 via ORAL
  Filled 2023-04-24 (×2): qty 2

## 2023-04-24 MED ORDER — FENTANYL CITRATE (PF) 100 MCG/2ML IJ SOLN: 25.0000 ug | INTRAMUSCULAR | Status: DC | PRN

## 2023-04-24 MED ORDER — FENTANYL CITRATE (PF) 100 MCG/2ML IJ SOLN
INTRAMUSCULAR | Status: AC
  Filled 2023-04-24: qty 2

## 2023-04-24 MED ORDER — ACETAMINOPHEN 325 MG PO TABS: 650.0000 mg | ORAL_TABLET | Freq: Four times a day (QID) | ORAL | Status: DC

## 2023-04-24 MED ORDER — MORPHINE SULFATE (PF) 0.5 MG/ML IJ SOLN
INTRAMUSCULAR | Status: DC | PRN
  Administered 2023-04-24: .1 mg via INTRATHECAL

## 2023-04-24 MED ORDER — SIMETHICONE 80 MG PO CHEW: 80.0000 mg | CHEWABLE_TABLET | ORAL | Status: DC | PRN

## 2023-04-24 MED ORDER — LIDOCAINE 5 % EX PTCH
1.0000 | MEDICATED_PATCH | CUTANEOUS | Status: DC
  Administered 2023-04-25 – 2023-04-26 (×2): 1 via TRANSDERMAL
  Filled 2023-04-24 (×2): qty 1

## 2023-04-24 MED ORDER — GABAPENTIN 300 MG PO CAPS
300.0000 mg | ORAL_CAPSULE | ORAL | Status: AC
  Administered 2023-04-24: 300 mg via ORAL
  Filled 2023-04-24: qty 1

## 2023-04-24 MED ORDER — 0.9 % SODIUM CHLORIDE (POUR BTL) OPTIME
TOPICAL | Status: DC | PRN
  Administered 2023-04-24: 1000 mL

## 2023-04-24 MED ORDER — OXYCODONE HCL 5 MG PO TABS: 5.0000 mg | ORAL_TABLET | Freq: Once | ORAL | Status: DC | PRN

## 2023-04-24 MED ORDER — ONDANSETRON HCL 4 MG/2ML IJ SOLN
INTRAMUSCULAR | Status: DC | PRN
  Administered 2023-04-24: 4 mg via INTRAVENOUS

## 2023-04-24 MED ORDER — PHENYLEPHRINE HCL-NACL 20-0.9 MG/250ML-% IV SOLN
INTRAVENOUS | Status: DC | PRN
  Administered 2023-04-24: 50 ug/min via INTRAVENOUS

## 2023-04-24 MED ORDER — BUPIVACAINE IN DEXTROSE 0.75-8.25 % IT SOLN
INTRATHECAL | Status: DC | PRN
  Administered 2023-04-24: 1.5 mL via INTRATHECAL

## 2023-04-24 MED ORDER — SODIUM CHLORIDE 0.9% FLUSH: 3.0000 mL | INTRAVENOUS | Status: DC | PRN

## 2023-04-24 MED ORDER — KETOROLAC TROMETHAMINE 30 MG/ML IJ SOLN
INTRAMUSCULAR | Status: DC | PRN
  Administered 2023-04-24: 30 mg via INTRAVENOUS

## 2023-04-24 MED ORDER — SOD CITRATE-CITRIC ACID 500-334 MG/5ML PO SOLN: 30.0000 mL | ORAL | Status: AC

## 2023-04-24 MED ORDER — OXYCODONE HCL 5 MG PO TABS
5.0000 mg | ORAL_TABLET | ORAL | Status: DC | PRN
  Administered 2023-04-24 (×2): 5 mg via ORAL
  Filled 2023-04-24 (×2): qty 1

## 2023-04-24 MED ORDER — OXYCODONE HCL 5 MG PO TABS
5.0000 mg | ORAL_TABLET | ORAL | Status: DC | PRN
  Administered 2023-04-25 (×3): 5 mg via ORAL
  Administered 2023-04-25: 10 mg via ORAL
  Administered 2023-04-26 (×2): 5 mg via ORAL
  Administered 2023-04-26: 10 mg via ORAL
  Filled 2023-04-24 (×5): qty 1
  Filled 2023-04-24 (×2): qty 2

## 2023-04-24 MED ORDER — GABAPENTIN 100 MG PO CAPS
100.0000 mg | ORAL_CAPSULE | Freq: Three times a day (TID) | ORAL | Status: DC
  Administered 2023-04-24 – 2023-04-26 (×6): 100 mg via ORAL
  Filled 2023-04-24 (×6): qty 1

## 2023-04-24 MED ORDER — OXYTOCIN-SODIUM CHLORIDE 30-0.9 UT/500ML-% IV SOLN
INTRAVENOUS | Status: DC | PRN
  Administered 2023-04-24: 30 [IU] via INTRAVENOUS

## 2023-04-24 MED ORDER — WITCH HAZEL-GLYCERIN EX PADS: 1.0000 | MEDICATED_PAD | CUTANEOUS | Status: DC | PRN

## 2023-04-24 MED ORDER — DIPHENHYDRAMINE HCL 50 MG/ML IJ SOLN: 12.5000 mg | INTRAMUSCULAR | Status: DC | PRN

## 2023-04-24 MED ORDER — PHENYLEPHRINE HCL-NACL 20-0.9 MG/250ML-% IV SOLN
INTRAVENOUS | Status: AC
  Filled 2023-04-24: qty 250

## 2023-04-24 MED ORDER — LIDOCAINE 5 % EX PTCH
MEDICATED_PATCH | CUTANEOUS | Status: AC
  Filled 2023-04-24: qty 1

## 2023-04-24 MED ORDER — CEFAZOLIN SODIUM-DEXTROSE 2-4 GM/100ML-% IV SOLN
2.0000 g | INTRAVENOUS | Status: AC
  Administered 2023-04-24: 2 g via INTRAVENOUS
  Filled 2023-04-24: qty 100

## 2023-04-24 MED ORDER — LIDOCAINE 5 % EX PTCH
MEDICATED_PATCH | CUTANEOUS | Status: DC | PRN
  Administered 2023-04-24: 1 via TRANSDERMAL

## 2023-04-24 MED ORDER — ACETAMINOPHEN 500 MG PO TABS
1000.0000 mg | ORAL_TABLET | ORAL | Status: AC
  Administered 2023-04-24: 1000 mg via ORAL
  Filled 2023-04-24: qty 2

## 2023-04-24 MED ORDER — METHYLERGONOVINE MALEATE 0.2 MG/ML IJ SOLN: 0.2000 mg | INTRAMUSCULAR | Status: DC | PRN

## 2023-04-24 MED ORDER — DIBUCAINE (PERIANAL) 1 % EX OINT: 1.0000 | TOPICAL_OINTMENT | CUTANEOUS | Status: DC | PRN

## 2023-04-24 MED ORDER — DIPHENHYDRAMINE HCL 25 MG PO CAPS: 25.0000 mg | ORAL_CAPSULE | Freq: Four times a day (QID) | ORAL | Status: DC | PRN

## 2023-04-24 MED ORDER — COCONUT OIL OIL
1.0000 | TOPICAL_OIL | Status: DC | PRN
  Administered 2023-04-24: 1 via TOPICAL
  Filled 2023-04-24 (×2): qty 7.5

## 2023-04-24 MED ORDER — ONDANSETRON HCL 4 MG/2ML IJ SOLN
INTRAMUSCULAR | Status: AC
  Filled 2023-04-24: qty 2

## 2023-04-24 MED ORDER — LIDOCAINE HCL (PF) 1 % IJ SOLN
INTRAMUSCULAR | Status: DC | PRN
  Administered 2023-04-24: 3 mL via SUBCUTANEOUS

## 2023-04-24 MED ORDER — NALOXONE HCL 0.4 MG/ML IJ SOLN: 0.4000 mg | INTRAMUSCULAR | Status: DC | PRN

## 2023-04-24 MED ORDER — PROPOFOL 10 MG/ML IV BOLUS
INTRAVENOUS | Status: AC
  Filled 2023-04-24: qty 20

## 2023-04-24 MED ORDER — IBUPROFEN 600 MG PO TABS
600.0000 mg | ORAL_TABLET | Freq: Four times a day (QID) | ORAL | Status: DC
  Administered 2023-04-25 – 2023-04-26 (×4): 600 mg via ORAL
  Filled 2023-04-24 (×4): qty 1

## 2023-04-24 MED ORDER — KETOROLAC TROMETHAMINE 30 MG/ML IJ SOLN
INTRAMUSCULAR | Status: AC
  Filled 2023-04-24: qty 1

## 2023-04-24 MED ORDER — ONDANSETRON HCL 4 MG/2ML IJ SOLN: 4.0000 mg | Freq: Three times a day (TID) | INTRAMUSCULAR | Status: DC | PRN

## 2023-04-24 SURGICAL SUPPLY — 39 items
APL PRP STRL LF DISP 70% ISPRP (MISCELLANEOUS) ×2
APL SKNCLS STERI-STRIP NONHPOA (GAUZE/BANDAGES/DRESSINGS) ×1
BAG COUNTER SPONGE SURGICOUNT (BAG) ×1 IMPLANT
BAG SPNG CNTER NS LX DISP (BAG) ×1
BENZOIN TINCTURE PRP APPL 2/3 (GAUZE/BANDAGES/DRESSINGS) IMPLANT
BNDG TENSOPLAST 6X5 (GAUZE/BANDAGES/DRESSINGS) IMPLANT
CELL SAVER LIPIGURD (MISCELLANEOUS) ×1 IMPLANT
CHLORAPREP W/TINT 26 (MISCELLANEOUS) ×2 IMPLANT
CLOSURE STERI STRIP 1/2 X4 (GAUZE/BANDAGES/DRESSINGS) IMPLANT
DEVICE RETRIEVAL ALEXIS 14 (MISCELLANEOUS) IMPLANT
DRESSING TELFA 8X3 (GAUZE/BANDAGES/DRESSINGS) IMPLANT
DRSG CURAFIL 4X4 STRL (GAUZE/BANDAGES/DRESSINGS) IMPLANT
DRSG TELFA 3X8 NADH STRL (GAUZE/BANDAGES/DRESSINGS) ×1 IMPLANT
ELECT REM PT RETURN 9FT ADLT (ELECTROSURGICAL) ×1
ELECTRODE REM PT RTRN 9FT ADLT (ELECTROSURGICAL) ×1 IMPLANT
EXTRT SYSTEM ALEXIS 14CM (MISCELLANEOUS) ×1
EXTRT SYSTEM ALEXIS 17CM (MISCELLANEOUS)
GAUZE CURAFIL 4X4 (GAUZE/BANDAGES/DRESSINGS) IMPLANT
GAUZE SPONGE 4X4 12PLY STRL (GAUZE/BANDAGES/DRESSINGS) ×1 IMPLANT
GLOVE BIO SURGEON STRL SZ 6.5 (GLOVE) ×1 IMPLANT
GLOVE INDICATOR 7.0 STRL GRN (GLOVE) ×1 IMPLANT
GOWN STRL REUS W/ TWL LRG LVL3 (GOWN DISPOSABLE) ×2 IMPLANT
GOWN STRL REUS W/TWL LRG LVL3 (GOWN DISPOSABLE) ×2
KIT TURNOVER KIT A (KITS) ×1 IMPLANT
MANIFOLD NEPTUNE II (INSTRUMENTS) ×1 IMPLANT
MAT PREVALON FULL STRYKER (MISCELLANEOUS) ×1 IMPLANT
NS IRRIG 1000ML POUR BTL (IV SOLUTION) ×1 IMPLANT
PACK C SECTION AR (MISCELLANEOUS) ×1 IMPLANT
PAD OB MATERNITY 4.3X12.25 (PERSONAL CARE ITEMS) ×1 IMPLANT
PAD PREP OB/GYN DISP 24X41 (PERSONAL CARE ITEMS) ×1 IMPLANT
SCRUB CHG 4% DYNA-HEX 4OZ (MISCELLANEOUS) ×1 IMPLANT
SUT MNCRL AB 4-0 PS2 18 (SUTURE) ×1 IMPLANT
SUT PLAIN 2 0 XLH (SUTURE) IMPLANT
SUT VIC AB 0 CT1 36 (SUTURE) ×4 IMPLANT
SUT VIC AB 3-0 SH 27 (SUTURE) ×1
SUT VIC AB 3-0 SH 27X BRD (SUTURE) ×1 IMPLANT
SYSTEM CONTND EXTRCTN KII BLLN (MISCELLANEOUS) IMPLANT
TRAP FLUID SMOKE EVACUATOR (MISCELLANEOUS) ×1 IMPLANT
WATER STERILE IRR 500ML POUR (IV SOLUTION) ×1 IMPLANT

## 2023-04-24 NOTE — Transfer of Care (Signed)
Immediate Anesthesia Transfer of Care Note  Patient: Kathryn Estrada  Procedure(s) Performed: REPEAT CESAREAN SECTION  Patient Location: PACU and Mother/Baby  Anesthesia Type:Spinal  Level of Consciousness: awake, alert , and oriented  Airway & Oxygen Therapy: Patient Spontanous Breathing  Post-op Assessment: Report given to RN and Post -op Vital signs reviewed and stable  Post vital signs: Reviewed and stable  Last Vitals:  Vitals Value Taken Time  BP 99/77 04/24/2023  Temp 97.4   Pulse 89   Resp 16   SpO2 100     Last Pain:  Vitals:   04/24/23 0751  TempSrc:   PainSc: 0-No pain         Complications: No notable events documented.

## 2023-04-24 NOTE — Anesthesia Preprocedure Evaluation (Signed)
Anesthesia Evaluation  Patient identified by MRN, date of birth, ID band Patient awake    Reviewed: Allergy & Precautions, NPO status , Patient's Chart, lab work & pertinent test results  History of Anesthesia Complications Negative for: history of anesthetic complications  Airway Mallampati: III  TM Distance: >3 FB Neck ROM: full    Dental  (+) Chipped   Pulmonary neg shortness of breath   Pulmonary exam normal        Cardiovascular Exercise Tolerance: Good hypertension, (-) angina Normal cardiovascular exam     Neuro/Psych    GI/Hepatic ,GERD  Controlled,,  Endo/Other    Renal/GU   negative genitourinary   Musculoskeletal   Abdominal   Peds  Hematology negative hematology ROS (+)   Anesthesia Other Findings Past Medical History: No date: Anemia No date: Complication of anesthesia     Comment:  takes alot of anesthetic for dental procedure to become               numb No date: Elevated blood pressure reading No date: Family history of adverse reaction to anesthesia     Comment:  takes alot of anesthetic at dentist to become numb No date: GERD (gastroesophageal reflux disease) No date: Raynaud's syndrome without gangrene  Past Surgical History: 04/03/2021: CESAREAN SECTION; N/A     Comment:  Procedure: CESAREAN SECTION;  Surgeon: Natale Milch, MD;  Location: [facility] ORS;  Service:               Obstetrics;  Laterality: N/A; No date: WISDOM TOOTH EXTRACTION     Comment:  four;- age 18, 59, 58, 68     Reproductive/Obstetrics (+) Pregnancy                             Anesthesia Physical Anesthesia Plan  ASA: 2  Anesthesia Plan: Spinal   Post-op Pain Management:    Induction:   PONV Risk Score and Plan:   Airway Management Planned: Natural Airway and Nasal Cannula  Additional Equipment:   Intra-op Plan:   Post-operative Plan:   Informed  Consent: I have reviewed the patients History and Physical, chart, labs and discussed the procedure including the risks, benefits and alternatives for the proposed anesthesia with the patient or authorized representative who has indicated his/her understanding and acceptance.     Dental Advisory Given  Plan Discussed with: Anesthesiologist, CRNA and Surgeon  Anesthesia Plan Comments: (Patient reports no bleeding problems and no anticoagulant use.  Plan for spinal with backup GA  Patient consented for risks of anesthesia including but not limited to:  - adverse reactions to medications - damage to eyes, teeth, lips or other oral mucosa - nerve damage due to positioning  - risk of bleeding, infection and or nerve damage from spinal that could lead to paralysis - risk of headache or failed spinal - damage to teeth, lips or other oral mucosa - sore throat or hoarseness - damage to heart, brain, nerves, lungs, other parts of body or loss of life  Patient voiced understanding.)       Anesthesia Quick Evaluation

## 2023-04-24 NOTE — Progress Notes (Signed)
   04/24/23 1330  RN Transport Assessment (Complete at Beginning of Shift)  RN to accompany transport? Yes  Transport method Bed  Oxygen needed for transport? No  Does IV pole need to accompany transport? Yes  Hand-off documentation  Hand-off Given Given to Transfer Unit/facility  Report given to (Full Name) Wynona Neat, RN  Hand-off Received Received from Transfer Unit/facility  Report received from (Full Name) Marisa Sprinkles, RN   Pt wheeled in bed with infant in arms and s/o to room 338 for transfer to postpartum unit. Pt stable and a&o, report and care transferred to [facility], RN at this time

## 2023-04-24 NOTE — Anesthesia Procedure Notes (Signed)
Spinal  Patient location during procedure: OR Start time: 04/24/2023 9:05 AM End time: 04/24/2023 9:09 AM Reason for block: surgical anesthesia Staffing Performed: resident/CRNA  Anesthesiologist: Reed Breech, MD Resident/CRNA: Elmarie Mainland, CRNA Performed by: Elmarie Mainland, CRNA Authorized by: Rosaria Ferries, MD   Preanesthetic Checklist Completed: patient identified, IV checked, site marked, risks and benefits discussed, surgical consent, monitors and equipment checked, pre-op evaluation and timeout performed Spinal Block Patient position: sitting Prep: ChloraPrep Patient monitoring: heart rate, continuous pulse ox, blood pressure and cardiac monitor Approach: midline Location: L3-4 Injection technique: single-shot Needle Needle type: Introducer and Pencan  Needle gauge: 24 G Needle length: 9 cm Assessment Sensory level: T4 Events: CSF return Additional Notes Sterile aseptic technique used throughout the procedure.  Negative paresthesia. Negative blood return. Positive free-flowing CSF. Expiration date of kit checked and confirmed. Patient tolerated procedure well, without complications.

## 2023-04-24 NOTE — Op Note (Signed)
Cesarean Section Procedure Note  Indications:  history of prior C-section x 1, declines vaginal attempt  Pre-operative Diagnosis: 39 week 0 day pregnancy, history of prior C-section x 1, obesity (BMI 35).  Post-operative Diagnosis: Same  Surgeon: Hildred Laser, MD  Assistants:  Paula Compton, CNM  Procedure: Repeat low transverse Cesarean Section  Anesthesia: Spinal anesthesia  Findings: Female infant, cephalic presentation, 3370 grams, with Apgar scores of 9 at one minute and 9 at five minutes. Intact placenta with 3 vessel cord.  Copious clear amniotic fluid at amniotomy Nuchal cord x 2 The uterine outline, tubes and ovaries appeared normal.   Procedure Details: The patient was seen in the Holding Room. The risks, benefits, complications, treatment options, and expected outcomes were discussed with the patient.  The patient concurred with the proposed plan, giving informed consent.  The site of surgery properly noted/marked. The patient was taken to the Operating Room, identified as Kathryn Estrada and the procedure verified as C-Section Delivery. A Time Out was held and the above information confirmed.  After induction of anesthesia, the patient was draped and prepped in the usual sterile manner. Anesthesia was tested and noted to be adequate. A Pfannenstiel incision was made and carried down through the subcutaneous tissue to the fascia. Fascial incision was made and extended transversely. The fascia was separated from the underlying rectus tissue superiorly and inferiorly. The peritoneum was identified and entered. Peritoneal incision was extended longitudinally. The surgical assist was able to provide retraction to allow for clear visualization of surgical site. An Alexis retractor was placed in the abdomen for additional retraction. The utero-vesical peritoneal reflection was incised transversely and the bladder flap was bluntly freed from the lower uterine segment. A low transverse  uterine incision was made. Delivered from cephalic presentation was a 3310 gram Female with Apgar scores of 9 at one minute and 9 at five minutes.  The assistant was able to apply adequate fundal pressure to allow for successful delivery of the fetus. Nuchal cord x 2 was reduced after delivery of the fetal head. After the umbilical cord was clamped and cut, cord blood was obtained for evaluation. Delayed cord clamping was observed. The placenta was removed intact and appeared normal. The uterus was exteriorized and cleared of all clots and debris. The uterine outline, tubes and ovaries appeared normal.  The uterine incision was closed with running locked sutures of 0-Vicryl.  A second suture of 0-Vicryl was used in an imbricating layer.  Hemostasis was observed. The uterus was then returned to the abdomen. The pericolic gutters were cleared of all clots and debris.  The fascia was then reapproximated with a running suture of 0-Vicryl. The subcutaneous fat layer was reapproximated with 2-0 Vicryl. The skin was reapproximated with 4-0 Monocryl. The incision was covered with steri-strips and a pressure dressing. A lidoderm patch was placed at incision site.   Instrument, sponge, and needle counts were correct prior the abdominal closure and at the conclusion of the case.    An experienced assistant was required given the standard of surgical care given the complexity of the case.  This assistant was needed for exposure, dissection, suctioning, retraction, instrument exchange, and for overall help during the procedure.  Estimated Blood Loss:  577 ml      Drains: foley catheter to gravity drainage, 50 ml of clear urine at end of the procedure         Total IV Fluids:   1200 ml  Specimens:  Implants: None         Complications:  None; patient tolerated the procedure well.         Disposition: PACU - hemodynamically stable.         Condition: stable   Hildred Laser, MD [facility] at  [facility]

## 2023-04-24 NOTE — Lactation Note (Signed)
This note was copied from a baby's chart. Lactation Consultation Note  Patient Name: Kathryn Estrada ZOXWR'U Date: 04/24/2023 Age:27 hours Reason for consult: Initial assessment;Term;Other (Comment) (c-section)   Maternal Data Has patient been taught Hand Expression?: Yes Does the patient have breastfeeding experience prior to this delivery?: Yes How long did the patient breastfeed?: 3 months  P2, repeat scheduled c-section @ 39weeks.  Mom has hx of Raynauds, GERD, Anemia, and displayed anxiety before c-section and PP.  First baby (now 27yrs old) had tongue/lip tie, revision completed, but BF remained difficult.  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  Feeding Mother's Current Feeding Choice: Breast Milk  Baby has BF for 30 minutes and for 10 minutes since delivery with 3rd feeding lasting around 2 minutes. LC at bedside; mom attempting a feeding. Baby appears sleepy, not interested or engaged in feeding attempt, but mom anxious about baby not feeding in almost 3 hours. BF attempt unsuccessful; baby did not latch.  LATCH Score Latch: Repeated attempts needed to sustain latch, nipple held in mouth throughout feeding, stimulation needed to elicit sucking reflex.  Audible Swallowing: None  Type of Nipple: Everted at rest and after stimulation  Comfort (Breast/Nipple): Soft / non-tender  Hold (Positioning): Assistance needed to correctly position infant at breast and maintain latch.  LATCH Score: 6   Lactation Tools Discussed/Used    Interventions Interventions: Breast feeding basics reviewed;Skin to skin;Assisted with latch;Breast massage;Hand express;Adjust position;Support pillows;Education  Provided reassurance to mom about BF patterns and newborn feeding behaviors in first 24 hours. Encouraged frequent attempts, but guidance that baby not not feed each time. Reviewed hand expression and  spoon feeding or early pump introduction as options to help curb mom's worries.  LC did perform an oral assessment and baby was able to grasp gloved finger well, flanged upper and lower lips, and had a rhythmic sucking pattern. LC did not feel any chomping/pain/discomfort on the finger.  Discharge Pump: Personal  Consult Status Consult Status: Follow-up    Kathryn Estrada 04/24/2023, 3:18 PM

## 2023-04-24 NOTE — H&P (Signed)
Obstetric Preoperative History and Physical  Kathryn Estrada is a 27 y.o. G2P1001 with IUP at [redacted]w[redacted]d presenting for presenting for scheduled repeat cesarean section.  No acute concerns.   Prenatal Course Source of Care: [facility]  with onset of care at 7 weeks Pregnancy complications or risks: Patient Active Problem List   Diagnosis Date Noted   History of cesarean delivery affecting pregnancy 04/24/2023   Gestational hypertension, third trimester 04/21/2023   [redacted] weeks gestation of pregnancy 04/21/2023   Labor and delivery, indication for care 04/19/2023   Elevated blood pressure reading 04/11/2023   Decreased fetal movements in third trimester 04/10/2023   [redacted] weeks gestation of pregnancy 04/10/2023   Supervision of normal pregnancy 03/06/2023   Obesity in pregnancy, antepartum, third trimester 03/06/2023   Previous cesarean delivery affecting pregnancy 12/10/2022   B12 deficiency 04/16/2020   Tobacco use disorder 03/28/2020   Raynaud's phenomenon without gangrene 01/30/2020   Prolonged capillary refill time 01/30/2020   She plans to breastfeed She desires condoms for postpartum contraception.   Prenatal labs and studies: ABO, Rh: --/--/O POS (04/30 1104) Antibody: NEG (04/30 1104) Rubella: 3.56 (10/12 1023) RPR: NON REACTIVE (04/30 1104)  HBsAg: Negative (10/12 1023)  HIV: Non Reactive (02/14 1048)  ZOX:WRUEAVWU/-- (04/10 1342) 1 hr Glucola  normal x 2 Genetic screening Declined Anatomy US normal   Past Medical History:  Diagnosis Date   Anemia    Complication of anesthesia    takes alot of anesthetic for dental procedure to become numb   Elevated blood pressure reading    Family history of adverse reaction to anesthesia    takes alot of anesthetic at dentist to become numb   GERD (gastroesophageal reflux disease)    Raynaud's syndrome without gangrene     Past Surgical History:  Procedure Laterality Date   CESAREAN SECTION N/A 04/03/2021   Procedure:  CESAREAN SECTION;  Surgeon: Natale Milch, MD;  Location: [facility] ORS;  Service: Obstetrics;  Laterality: N/A;   WISDOM TOOTH EXTRACTION     four;- age 23, 71, 61, 58    OB History  Gravida Para Term Preterm AB Living  2 1 1     1   SAB IAB Ectopic Multiple Live Births        0 1    # Outcome Date GA Lbr Len/2nd Weight Sex Delivery Anes PTL Lv  2 Current           1 Term 04/03/21 [redacted]w[redacted]d  3500 g F CS-Vac Spinal  LIV    Social History   Socioeconomic History   Marital status: Married    Spouse name: Joselyn Glassman   Number of children: 1   Years of education: 13.5   Highest education level: Not on file  Occupational History   Occupation: stay at home mom  Tobacco Use   Smoking status: Every Day    Types: E-cigarettes   Smokeless tobacco: Never   Tobacco comments:    Quit cigarettes 08-28-2020, now vapes   Vaping Use   Vaping Use: Former   Quit date: 08/28/2020   Devices: vape   Substance and Sexual Activity   Alcohol use: Not Currently    Comment: ocassinally/ once a month    Drug use: Never   Sexual activity: Yes    Partners: Male    Birth control/protection: None    Comment: undecided  Other Topics Concern   Not on file  Social History Narrative   Not on file   Social Determinants  of Health   Financial Resource Strain: Low Risk  (09/18/2022)   Overall Financial Resource Strain (CARDIA)    Difficulty of Paying Living Expenses: Not very hard  Food Insecurity: No Food Insecurity (09/18/2022)   Hunger Vital Sign    Worried About Running Out of Food in the Last Year: Never true    Ran Out of Food in the Last Year: Never true  Transportation Needs: No Transportation Needs (09/18/2022)   PRAPARE - Administrator, Civil Service (Medical): No    Lack of Transportation (Non-Medical): No  Physical Activity: Inactive (09/18/2022)   Exercise Vital Sign    Days of Exercise per Week: 0 days    Minutes of Exercise per Session: 0 min  Stress: No Stress Concern Present  (09/18/2022)   [employer] of Occupational Health - Occupational Stress Questionnaire    Feeling of Stress : Not at all  Social Connections: Moderately Integrated (09/18/2022)   Social Connection and Isolation Panel [NHANES]    Frequency of Communication with Friends and Family: More than three times a week    Frequency of Social Gatherings with Friends and Family: Three times a week    Attends Religious Services: More than 4 times per year    Active Member of Clubs or Organizations: No    Attends Banker Meetings: Never    Marital Status: Married    Family History  Problem Relation Age of Onset   Neurofibromatosis Mother    Anemia Mother    Neurofibromatosis Sister    Neurofibromatosis Brother    Neurofibromatosis Brother    Schizophrenia Maternal Grandfather    Kidney disease Paternal Grandmother    Cancer Paternal Grandfather 82       colon    Medications Prior to Admission  Medication Sig Dispense Refill Last Dose   calcium carbonate (TUMS - DOSED IN MG ELEMENTAL CALCIUM) 500 MG chewable tablet Chew 1 tablet by mouth as needed for indigestion or heartburn.      ondansetron (ZOFRAN-ODT) 4 MG disintegrating tablet Take 1 tablet (4 mg total) by mouth every 6 (six) hours as needed for nausea. 20 tablet 0    Prenatal Vit-Fe Fumarate-FA (MULTIVITAMIN-PRENATAL) 27-0.8 MG TABS tablet Take 1 tablet by mouth daily at 12 noon.       No Known Allergies  Review of Systems: Negative except for what is mentioned in HPI.  Physical Exam: BP 133/82   Pulse (!) 108   Resp 18   LMP 07/25/2022 (Exact Date)  FHR by Doppler: 125 bpm GENERAL: Well-developed, well-nourished female in no acute distress. Anxious. LUNGS: Clear to auscultation bilaterally.  HEART: Regular rate and rhythm. ABDOMEN: Soft, nontender, nondistended, gravid, well-healed Pfannenstiel incision. PELVIC: Deferred EXTREMITIES: Nontender, no edema, 2+ distal pulses.   Pertinent Labs/Studies:   Results  for orders placed or performed during the hospital encounter of 04/22/23 (from the past 72 hour(s))  CBC     Status: Abnormal   Collection Time: 04/22/23 11:04 AM  Result Value Ref Range   WBC 8.8 4.0 - 10.5 K/uL   RBC 3.89 3.87 - 5.11 MIL/uL   Hemoglobin 10.5 (L) 12.0 - 15.0 g/dL   HCT 40.9 (L) 81.1 - 91.4 %   MCV 84.1 80.0 - 100.0 fL   MCH 27.0 26.0 - 34.0 pg   MCHC 32.1 30.0 - 36.0 g/dL   RDW 78.2 95.6 - 21.3 %   Platelets 188 150 - 400 K/uL   nRBC 0.0 0.0 - 0.2 %  Comment: Performed at [facility], [address] [place], [place] [address]  RPR     Status: None   Collection Time: 04/22/23 11:04 AM  Result Value Ref Range   RPR Ser Ql NON REACTIVE NON REACTIVE    Comment: Performed at [facility], [address] [place], [place] [address]  Type and screen     Status: None   Collection Time: 04/22/23 11:04 AM  Result Value Ref Range   ABO/RH(D) O POS    Antibody Screen NEG    Sample Expiration 04/25/2023,2359    Extend sample reason      PREGNANT WITHIN 3 MONTHS, UNABLE TO EXTEND Performed at [facility], [address] [place], [place] [address]   Basic metabolic panel     Status: Abnormal   Collection Time: 04/22/23 11:04 AM  Result Value Ref Range   Sodium 134 (L) 135 - 145 mmol/L   Potassium 3.4 (L) 3.5 - 5.1 mmol/L   Chloride 107 98 - 111 mmol/L   CO2 20 (L) 22 - 32 mmol/L   Glucose, Bld 112 (H) 70 - 99 mg/dL    Comment: Glucose reference range applies only to samples taken after fasting for at least 8 hours.   BUN <5 (L) 6 - 20 mg/dL   Creatinine, Ser 9.14 0.44 - 1.00 mg/dL   Calcium 7.6 (L) 8.9 - 10.3 mg/dL   GFR, Estimated >78 >29 mL/min    Comment: (NOTE) Calculated using the CKD-EPI Creatinine Equation (2021)    Anion gap 7 5 - 15    Comment: Performed at [facility], [address], [place], [place] [address]    Assessment and Plan :Alayza Pieper is a 27 y.o. G2P1001 at [redacted]w[redacted]d being admitted  for scheduled  cesarean section delivery . The patient is understanding of the planned procedure and is aware of and accepting of all surgical risks, including but not limited to: bleeding which may require transfusion or reoperation; infection which may require antibiotics; injury to bowel, bladder, ureters or other surrounding organs which may require repair; injury to the fetus; need for additional procedures including hysterectomy in the event of life-threatening complications; placental abnormalities wth subsequent pregnancies; incisional problems; blood clot disorders which may require blood thinners;, and other postoperative/anesthesia complications. The patient is in agreement with the proposed plan, and gives informed written consent for the procedure. All questions have been answered.    Hildred Laser, MD [facility] at [facility]

## 2023-04-25 ENCOUNTER — Encounter: Payer: Self-pay | Admitting: Obstetrics and Gynecology

## 2023-04-25 ENCOUNTER — Encounter: Payer: Medicaid Other | Admitting: Obstetrics and Gynecology

## 2023-04-25 LAB — CBC
HCT: 28.5 % — ABNORMAL LOW (ref 36.0–46.0)
Hemoglobin: 9.1 g/dL — ABNORMAL LOW (ref 12.0–15.0)
MCH: 26.8 pg (ref 26.0–34.0)
MCHC: 31.9 g/dL (ref 30.0–36.0)
MCV: 84.1 fL (ref 80.0–100.0)
Platelets: 168 10*3/uL (ref 150–400)
RBC: 3.39 MIL/uL — ABNORMAL LOW (ref 3.87–5.11)
RDW: 14 % (ref 11.5–15.5)
WBC: 13.1 10*3/uL — ABNORMAL HIGH (ref 4.0–10.5)
nRBC: 0 % (ref 0.0–0.2)

## 2023-04-25 MED ORDER — POLYETHYLENE GLYCOL 3350 17 G PO PACK
17.0000 g | PACK | Freq: Every day | ORAL | Status: DC | PRN
  Administered 2023-04-25: 17 g via ORAL
  Filled 2023-04-25 (×2): qty 1

## 2023-04-25 MED ORDER — PHENYLEPHRINE HCL-NACL 20-0.9 MG/250ML-% IV SOLN
INTRAVENOUS | Status: AC
  Filled 2023-04-25: qty 250

## 2023-04-25 NOTE — Anesthesia Postprocedure Evaluation (Signed)
Anesthesia Post Note  Patient: Kathryn Estrada  Procedure(s) Performed: REPEAT CESAREAN SECTION  Patient location during evaluation: Mother Baby Anesthesia Type: Spinal Level of consciousness: awake and alert and oriented Pain management: pain level controlled Vital Signs Assessment: post-procedure vital signs reviewed and stable Respiratory status: respiratory function stable Cardiovascular status: stable Postop Assessment: no headache, no backache, patient able to bend at knees, no apparent nausea or vomiting, able to ambulate and adequate PO intake Anesthetic complications: no   No notable events documented.   Last Vitals:  Vitals:   04/25/23 0300 04/25/23 0756  BP: 126/75 122/84  Pulse: 95 93  Resp: 20 18  Temp: 36.9 C 37.1 C  SpO2: 97% 99%    Last Pain:  Vitals:   04/25/23 0800  TempSrc:   PainSc: 5                  Zachary George

## 2023-04-25 NOTE — Progress Notes (Signed)
Postpartum Day # 1: Cesarean Delivery (repeat)  Subjective: Patient reports tolerating PO, + flatus, and no problems voiding.  Pain is controlled with pain medications. Bleeding is normal, no passage of large clots. Breastfeeding is going ok.    Objective: Vital signs in last 24 hours: Temp:  [97.3 F (36.3 C)-99 F (37.2 C)] 98.7 F (37.1 C) (05/03 0756) Pulse Rate:  [75-106] 93 (05/03 0756) Resp:  [15-24] 18 (05/03 0756) BP: (104-136)/(74-88) 122/84 (05/03 0756) SpO2:  [97 %-100 %] 99 % (05/03 0756)  Physical Exam:  General: alert and no distress Lungs: clear to auscultation bilaterally Breasts: normal appearance, no masses or tenderness Heart: regular rate and rhythm, S1, S2 normal, no murmur, click, rub or gallop Abdomen: soft, non-tender; bowel sounds normal; no masses,  no organomegaly Pelvis: Lochia appropriate, Uterine Fundus firm, Incision: healing well, no significant drainage, no dehiscence, no significant erythema Extremities: DVT Evaluation: No evidence of DVT seen on physical exam. Negative Homan's sign. No cords or calf tenderness.  Recent Labs    04/22/23 1104 04/25/23 0420  HGB 10.5* 9.1*  HCT 32.7* 28.5*    Assessment/Plan: Status post Cesarean section. Doing well postoperatively.  Breastfeeding, Lactation consult,  Contraception: condoms Regular diet Continue PO pain management Continue current care. Plan for discharge tomorrow     Hildred Laser, MD [facility] at [facility]

## 2023-04-25 NOTE — Lactation Note (Signed)
This note was copied from a baby's chart. Lactation Consultation Note  Patient Name: Girl Farhiya Vannatter ZOXWR'U Date: 04/25/2023 Age:27 hours Reason for consult: Follow-up assessment;Term;Mother's request;RN request;Other (Comment) (C/S)   Maternal Data This is mom's 2nd baby, C/S repeat elective. Mom with history of anemia and raynauds.  On follow-up today care nurse reports mom at this time prefers to exclusively pump. In meeting with mom she requested I assist her with pumping and assist her with correctly sizing her for the appropriate breast shields.Per mom her nipples are too sore and she prefers to exclusively pump. In talking with mom she may revisit breastfeeding in the future but her main goal now is to pump to provide breastmilk and heal her nipples. Per mom her first baby had a lip and tongue tie and she exclusively pumped and bottle fed after a trial of breastfeeding in which are nipples were cracked and bleeding and use of a nipple shield during that period of time made it worse.This baby also has a tongue tie. Has patient been taught Hand Expression?: Yes Does the patient have breastfeeding experience prior to this delivery?: Yes How long did the patient breastfeed?: initially attempted breastfeeding then pumped 3 months  Feeding Mother's Current Feeding Choice: Breast Milk and Formula Mom will use formula supplement as needed in addition to any breastmilk she can pump and provide baby.   Lactation Tools Discussed/Used Tools: Pump Breast pump type: Double-Electric Breast Pump Pump Education: Setup, frequency, and cleaning;Milk Storage Reason for Pumping: Maternal request Pumping frequency: goal of 8 times/24 hours to establish a milk supply when exclusively pumping. Pumped volume:  (Set up and provided all pump instructions. Mom perefers to wait to pump until after her expected visitors leave.)  Interventions Interventions: DEBP;Education  Discharge Pump: Personal  (Lansinoh double electric pump.Discussed with mom difference between double electric breastpump (hospital grade) to establish a milk supply vs. a mainteneance pump.Marland Kitchen)  Consult Status Consult Status: Follow-up Date: 04/25/23 Follow-up type: In-patient  Update provided to care nurse.  Fuller Song 04/25/2023, 3:10 PM

## 2023-04-26 ENCOUNTER — Other Ambulatory Visit: Payer: Self-pay | Admitting: Obstetrics

## 2023-04-26 ENCOUNTER — Ambulatory Visit: Payer: Self-pay

## 2023-04-26 MED ORDER — FERROUS SULFATE 325 (65 FE) MG PO TBEC: 325.0000 mg | DELAYED_RELEASE_TABLET | Freq: Two times a day (BID) | ORAL | 3 refills | Status: DC

## 2023-04-26 MED ORDER — DOCUSATE SODIUM 100 MG PO CAPS: 100.0000 mg | ORAL_CAPSULE | Freq: Two times a day (BID) | ORAL | 2 refills | Status: DC

## 2023-04-26 MED ORDER — TETANUS-DIPHTH-ACELL PERTUSSIS 5-2.5-18.5 LF-MCG/0.5 IM SUSY: 0.5000 mL | PREFILLED_SYRINGE | Freq: Once | INTRAMUSCULAR | Status: DC

## 2023-04-26 MED ORDER — OXYCODONE HCL 5 MG PO TABS: 5.0000 mg | ORAL_TABLET | ORAL | 0 refills | Status: DC | PRN

## 2023-04-26 NOTE — Final Progress Note (Signed)
Subjective: Postpartum Day 2: Cesarean Delivery Kathryn Estrada is feeling well overall. She is ambulating, voiding, passing flatus, and tolerating POs without difficulty. Her pain is well-controlled and her bleeding is WNL. Her mood is stable. She is pumping breastmilk fpr now. Baby has an appointment for tongue tie revision later in the week.   Objective: Vital signs in last 24 hours: Temp:  [98 F (36.7 C)-98.1 F (36.7 C)] 98 F (36.7 C) (05/04 0921) Pulse Rate:  [83-99] 99 (05/04 0921) Resp:  [16-20] 20 (05/04 0921) BP: (124-135)/(86-88) 124/88 (05/04 0921) SpO2:  [100 %] 100 % (05/04 0921)  Physical Exam:  General: alert, cooperative, and appears stated age Heart: RRR Lungs: CTAB Abdomen: soft, non-tender, normal bowel sounds Lochia: appropriate Uterine Fundus: firm Incision: healing well, no significant drainage, dressing c/d/i DVT Evaluation: No evidence of DVT seen on physical exam.  Recent Labs    04/25/23 0420  HGB 9.1*  HCT 28.5*    Assessment/Plan: Status post Cesarean section. Doing well postoperatively.  Discharge home Discharge instructions reviewed Acute blood loss anemia - PO iron prescribed Follow up: incision check 04/30/23. Video visit in 2 weeks, office visit in 6 weeks Condoms for contraception  Glenetta Borg, CNM 04/26/2023, 11:02 AM

## 2023-04-26 NOTE — Lactation Note (Signed)
This note was copied from a baby's chart. Lactation Consultation Note  Patient Name: Kathryn Estrada ZOXWR'U Date: 04/26/2023 Age:27 hours Reason for consult: Follow-up assessment;Exclusive pumping and bottle feeding;Term;Maternal discharge   Maternal Data Does the patient have breastfeeding experience prior to this delivery?: Yes How long did the patient breastfeed?: 3 mths (pumped and bottlefed EBM)  Feeding Mother's Current Feeding Choice: Breast Milk and Formula Nipple Type: Slow - flow Baby has a tongue tie which parents plan on having corrected after discharge.  After tongue tie is revised, she plans on attempting latching baby to breast, she was informed that she can call for a lactation consult after this to assist with latching baby to breast.  Until then she plans on continuing to pump her breasts and feed baby EBM per bottle.  She is pumping 15-20 cc each session and is encouraged by this.  She felt she had a low supply with first child.   LATCH Score Latch:  (I did not observe a feeding)                  Lactation Tools Discussed/Used Breast pump type: Double-Electric Breast Pump Reason for Pumping: to express breast milk for poor latch Pumping frequency: 8x/24 hr  Interventions Interventions: DEBP LC name and no written on white board Discharge Pump: DEBP;Personal  Consult Status Consult Status: PRN Date: 04/26/23 Follow-up type: In-patient    Kathryn Estrada 04/26/2023, 2:58 PM

## 2023-04-26 NOTE — Progress Notes (Signed)
Pt discharged with infant.  Discharge instructions, prescriptions and follow up appointment given to and reviewed with pt. Pt verbalized understanding. Escorted out by auxillary. 

## 2023-04-26 NOTE — Progress Notes (Signed)
Pt unable to obtain [facility]codone from [facility] on [facility]. [facility] re[facility] to [facility] [address].  Andres Vest Quitman Livings, CNM

## 2023-04-26 NOTE — Discharge Summary (Signed)
Postpartum Discharge Summary  Date of Service updated 04/26/23      Patient Name: Kathryn Estrada DOB: [birth year] MRN: [id number]  Date of admission: 04/24/2023 Delivery date:04/24/2023  Delivering provider: Hildred Laser  Date of discharge: 04/26/2023  Admitting diagnosis: History of cesarean delivery affecting pregnancy [O34.219] Intrauterine pregnancy: [redacted]w[redacted]d     Secondary diagnosis:  Principal Problem:   History of cesarean delivery affecting pregnancy  Additional problems: None    Discharge diagnosis: Term Pregnancy Delivered                                              Post partum procedures: none Augmentation: N/A Complications: None  Hospital course: Sceduled C/S   27 y.o. yo G2P2002 at [redacted]w[redacted]d was admitted to the hospital 04/24/2023 for scheduled cesarean section with the following indication:Elective Repeat.Delivery details are as follows:  Membrane Rupture Time/Date: 9:32 AM ,04/24/2023   Delivery Method:C-Section, Low Transverse  Details of operation can be found in separate operative note.  Patient had an uncomplicated postpartum course.  She is ambulating, tolerating a regular diet, passing flatus, and urinating well. Patient is discharged home in stable condition on  04/26/23        Newborn Data: Birth date:04/24/2023  Birth time:9:35 AM  Gender:Female  Living status:Living  Apgars:9 ,9  Weight:3310 g     Magnesium Sulfate received: No BMZ received: No Rhophylac:N/A MMR:N/A T-DaP: ordered postpartum Flu: No Transfusion:No  Physical exam  Vitals:   04/25/23 0756 04/25/23 1535 04/26/23 0017 04/26/23 0921  BP: 122/84 135/86 134/88 124/88  Pulse: 93 91 83 99  Resp: 18 18 16 20   Temp: 98.7 F (37.1 C) 98 F (36.7 C) 98.1 F (36.7 C) 98 F (36.7 C)  TempSrc: Oral Oral Oral Oral  SpO2: 99%  100% 100%  Weight:      Height:       General: alert, cooperative, and no distress Lochia: appropriate Uterine Fundus: firm Incision: Healing well with no significant  drainage, Dressing is clean, dry, and intact DVT Evaluation: No evidence of DVT seen on physical exam. Labs: Lab Results  Component Value Date   WBC 13.1 (H) 04/25/2023   HGB 9.1 (L) 04/25/2023   HCT 28.5 (L) 04/25/2023   MCV 84.1 04/25/2023   PLT 168 04/25/2023      Latest Ref Rng & Units 04/22/2023   11:04 AM  CMP  Glucose 70 - 99 mg/dL 213   BUN 6 - 20 mg/dL <5   Creatinine 0.86 - 1.00 mg/dL 5.78   Sodium 469 - 629 mmol/L 134   Potassium 3.5 - 5.1 mmol/L 3.4   Chloride 98 - 111 mmol/L 107   CO2 22 - 32 mmol/L 20   Calcium 8.9 - 10.3 mg/dL 7.6    Edinburgh Score:    04/24/2023    5:21 PM  Edinburgh Postnatal Depression Scale Screening Tool  I have been able to laugh and see the funny side of things. 0  I have looked forward with enjoyment to things. 0  I have blamed myself unnecessarily when things went wrong. 0  I have been anxious or worried for no good reason. 0  I have felt scared or panicky for no good reason. 0  Things have been getting on top of me. 0  I have been so unhappy that I have had difficulty sleeping. 0  I have felt sad or miserable. 0  I have been so unhappy that I have been crying. 0  The thought of harming myself has occurred to me. 0  Edinburgh Postnatal Depression Scale Total 0      After visit meds:  Allergies as of 04/26/2023   No Known Allergies      Medication List     TAKE these medications    calcium carbonate 500 MG chewable tablet Commonly known as: TUMS - dosed in mg elemental calcium Chew 1 tablet by mouth as needed for indigestion or heartburn.   docusate sodium 100 MG capsule Commonly known as: Colace Take 1 capsule (100 mg total) by mouth 2 (two) times daily.   ferrous sulfate 325 (65 FE) MG EC tablet Take 1 tablet (325 mg total) by mouth 2 (two) times daily.   multivitamin-prenatal 27-0.8 MG Tabs tablet Take 1 tablet by mouth daily at 12 noon.   ondansetron 4 MG disintegrating tablet Commonly known as:  ZOFRAN-ODT Take 1 tablet (4 mg total) by mouth every 6 (six) hours as needed for nausea.   oxyCODONE 5 MG immediate release tablet Commonly known as: Oxy IR/ROXICODONE Take 1-2 tablets (5-10 mg total) by mouth every 4 (four) hours as needed for moderate pain or severe pain.         Discharge home in stable condition Infant Feeding:  EBM Infant Disposition:home with mother Discharge instruction: per After Visit Summary and Postpartum booklet. Activity: Advance as tolerated. Pelvic rest for 6 weeks.  Diet: routine diet Anticipated Birth Control: Condoms Postpartum Appointment: video visit in 2 weeks, office visit in 6 weeks Additional Postpartum F/U: Incision check 1 week Future Appointments: Future Appointments  Date Time Provider Department Center  04/30/2023 10:35 AM Hildred Laser, MD [facility] None   Follow up Visit:    04/26/2023 Glenetta Borg, CNM

## 2023-04-28 ENCOUNTER — Telehealth: Payer: Medicaid Other | Admitting: Physician Assistant

## 2023-04-28 ENCOUNTER — Encounter: Payer: Self-pay | Admitting: Physician Assistant

## 2023-04-28 VITALS — Resp 16 | Ht 65.0 in

## 2023-04-28 DIAGNOSIS — H6692 Otitis media, unspecified, left ear: Secondary | ICD-10-CM

## 2023-04-28 MED ORDER — CIPROFLOXACIN-DEXAMETHASONE 0.3-0.1 % OT SUSP
4.0000 [drp] | Freq: Two times a day (BID) | OTIC | 0 refills | Status: DC
Start: 2023-04-28 — End: 2023-05-08

## 2023-04-28 NOTE — Progress Notes (Signed)
[facility] [address] [place], [place] [address]  Internal MEDICINE  Telephone Visit  Patient Name: Kathryn Estrada  [id number]  [id number]  Date of Service: 04/28/2023  I connected with the patient at 10:39 by telephone and verified the patients identity using two identifiers.   I discussed the limitations, risks, security and privacy concerns of performing an evaluation and management service by telephone and the availability of in person appointments. I also discussed with the patient that there may be a patient responsible charge related to the service.  The patient expressed understanding and agrees to proceed.    Chief Complaint  Patient presents with   Telephone Assessment    Ok with video call: [phone]   Telephone Screen   Ear Pain    Left ear pain that is throbbing, patient just gave birth and is breast feeding.    HPI Pt is here for virtual sick visit -She has been having left ear pain for awhile. About 3 weeks ago did have a cold and it kind of  went away on its own, but 2 days prior to having her baby her ear started really hurting.  -She recently gave birth on Thursday by C-Section and reports her ear was looked at in the hospital but wasn't given anything at the time and was advised to see PCP Monday -Denies hearing changes, but described the pain as throbbing with pressure. -Pt is breast feeding currently. States her supply is low and baby is only getting small portion from her but she is hoping to increase and is working with lactation. Will start with otic drops to try to limit impact since low systemic impact, but pt advised to call if not improving  Current Medication: Outpatient Encounter Medications as of 04/28/2023  Medication Sig   calcium carbonate (TUMS - DOSED IN MG ELEMENTAL CALCIUM) 500 MG chewable tablet Chew 1 tablet by mouth as needed for indigestion or heartburn.   ciprofloxacin-dexamethasone (CIPRODEX) OTIC suspension Place 4  drops into the left ear 2 (two) times daily. For 7 days.   docusate sodium (COLACE) 100 MG capsule Take 1 capsule (100 mg total) by mouth 2 (two) times daily.   ferrous sulfate 325 (65 FE) MG EC tablet Take 1 tablet (325 mg total) by mouth 2 (two) times daily.   ondansetron (ZOFRAN-ODT) 4 MG disintegrating tablet Take 1 tablet (4 mg total) by mouth every 6 (six) hours as needed for nausea.   oxyCODONE (OXY IR/ROXICODONE) 5 MG immediate release tablet Take 1-2 tablets (5-10 mg total) by mouth every 4 (four) hours as needed for moderate pain or severe pain.   Prenatal Vit-Fe Fumarate-FA (MULTIVITAMIN-PRENATAL) 27-0.8 MG TABS tablet Take 1 tablet by mouth daily at 12 noon.   No facility-administered encounter medications on file as of 04/28/2023.    Surgical History: Past Surgical History:  Procedure Laterality Date   CESAREAN SECTION N/A 04/03/2021   Procedure: CESAREAN SECTION;  Surgeon: Natale Milch, MD;  Location: [facility] ORS;  Service: Obstetrics;  Laterality: N/A;   CESAREAN SECTION N/A 04/24/2023   Procedure: REPEAT CESAREAN SECTION;  Surgeon: Hildred Laser, MD;  Location: [facility] ORS;  Service: Obstetrics;  Laterality: N/A;   WISDOM TOOTH EXTRACTION     four;- age 6, 75, 88, 80    Medical History: Past Medical History:  Diagnosis Date   Anemia    Complication of anesthesia    takes alot of anesthetic for dental procedure to become numb   Elevated blood pressure  reading    Family history of adverse reaction to anesthesia    takes alot of anesthetic at dentist to become numb   GERD (gastroesophageal reflux disease)    Raynaud's syndrome without gangrene     Family History: Family History  Problem Relation Age of Onset   Neurofibromatosis Mother    Anemia Mother    Neurofibromatosis Sister    Neurofibromatosis Brother    Neurofibromatosis Brother    Schizophrenia Maternal Grandfather    Kidney disease Paternal Grandmother    Cancer Paternal Grandfather 89       colon     Social History   Socioeconomic History   Marital status: Married    Spouse name: Joselyn Glassman   Number of children: 1   Years of education: 13.5   Highest education level: Not on file  Occupational History   Occupation: stay at home mom  Tobacco Use   Smoking status: Every Day    Types: E-cigarettes   Smokeless tobacco: Never   Tobacco comments:    Quit cigarettes 08-28-2020, now vapes   Vaping Use   Vaping Use: Former   Quit date: 08/28/2020   Devices: vape   Substance and Sexual Activity   Alcohol use: Not Currently    Comment: ocassinally/ once a month    Drug use: Never   Sexual activity: Yes    Partners: Male    Birth control/protection: None    Comment: undecided  Other Topics Concern   Not on file  Social History Narrative   Not on file   Social Determinants of Health   Financial Resource Strain: Low Risk  (09/18/2022)   Overall Financial Resource Strain (CARDIA)    Difficulty of Paying Living Expenses: Not very hard  Food Insecurity: No Food Insecurity (04/24/2023)   Hunger Vital Sign    Worried About Running Out of Food in the Last Year: Never true    Ran Out of Food in the Last Year: Never true  Transportation Needs: No Transportation Needs (04/24/2023)   PRAPARE - Administrator, Civil Service (Medical): No    Lack of Transportation (Non-Medical): No  Physical Activity: Inactive (09/18/2022)   Exercise Vital Sign    Days of Exercise per Week: 0 days    Minutes of Exercise per Session: 0 min  Stress: No Stress Concern Present (09/18/2022)   [employer] of Occupational Health - Occupational Stress Questionnaire    Feeling of Stress : Not at all  Social Connections: Moderately Integrated (09/18/2022)   Social Connection and Isolation Panel [NHANES]    Frequency of Communication with Friends and Family: More than three times a week    Frequency of Social Gatherings with Friends and Family: Three times a week    Attends Religious Services: More  than 4 times per year    Active Member of Clubs or Organizations: No    Attends Banker Meetings: Never    Marital Status: Married  Catering manager Violence: Not At Risk (04/24/2023)   Humiliation, Afraid, Rape, and Kick questionnaire    Fear of Current or Ex-Partner: No    Emotionally Abused: No    Physically Abused: No    Sexually Abused: No      Review of Systems  Constitutional:  Negative for fatigue and fever.  HENT:  Positive for ear pain. Negative for congestion, hearing loss, mouth sores and postnasal drip.   Respiratory:  Negative for cough.   Cardiovascular:  Negative for chest pain.  Genitourinary:  Negative for flank pain.  Psychiatric/Behavioral: Negative.      Vital Signs: Resp 16   Ht 5\' 5"  (1.651 m)   BMI 35.78 kg/m    Observation/Objective:  Pt is able to carry out conversation   Assessment/Plan: 1. Left otitis media, unspecified otitis media type Will start with drops and pt will call if not improving - ciprofloxacin-dexamethasone (CIPRODEX) OTIC suspension; Place 4 drops into the left ear 2 (two) times daily. For 7 days.  Dispense: 7.5 mL; Refill: 0   General Counseling: Lira verbalizes understanding of the findings of today's phone visit and agrees with plan of treatment. I have discussed any further diagnostic evaluation that may be needed or ordered today. We also reviewed her medications today. she has been encouraged to call the office with any questions or concerns that should arise related to todays visit.    No orders of the defined types were placed in this encounter.   Meds ordered this encounter  Medications   ciprofloxacin-dexamethasone (CIPRODEX) OTIC suspension    Sig: Place 4 drops into the left ear 2 (two) times daily. For 7 days.    Dispense:  7.5 mL    Refill:  0    Time spent:25 Minutes    Dr Lyndon Code Internal medicine

## 2023-04-29 NOTE — Progress Notes (Unsigned)
    OBSTETRICS/GYNECOLOGY POST-OPERATIVE CLINIC VISIT  Subjective:     Kathryn Estrada is a 27 y.o. female who presents to the clinic 1 weeks status post REPEAT CESAREAN SECTION  for History of Cesarean Section . Eating a regular diet {with-without:5700} difficulty. Bowel movements are {normal/abnormal***:19619}. {pain control:13522::"The patient is not having any pain."}  {Common ambulatory SmartLinks:19316}  Review of Systems {ros; complete:30496}   Objective:   There were no vitals taken for this visit. There is no height or weight on file to calculate BMI.  General:  alert and no distress  Abdomen: soft, bowel sounds active, non-tender  Incision:   {incision:13716::"no dehiscence","incision well approximated","healing well","no drainage","no erythema","no hernia","no seroma","no swelling"}    Pathology:    Assessment:   Patient s/p *** (surgery)  {doing well:13525::"Doing well postoperatively."}   Plan:   1. Continue any current medications as instructed by provider. 2. Wound care discussed. 3. Operative findings again reviewed. Pathology report discussed. 4. Activity restrictions: {restrictions:13723} 5. Anticipated return to work: {work return:14002}. 6. Follow up: {1-10:13787} {time; units:18646} for ***    Tommie Raymond, CMA [facility]

## 2023-04-30 ENCOUNTER — Encounter: Payer: Self-pay | Admitting: Obstetrics and Gynecology

## 2023-04-30 ENCOUNTER — Ambulatory Visit (INDEPENDENT_AMBULATORY_CARE_PROVIDER_SITE_OTHER): Payer: Medicaid Other | Admitting: Obstetrics and Gynecology

## 2023-04-30 VITALS — BP 128/76 | HR 91 | Resp 16 | Ht 65.0 in | Wt 219.6 lb

## 2023-04-30 DIAGNOSIS — R300 Dysuria: Secondary | ICD-10-CM

## 2023-04-30 DIAGNOSIS — Z4889 Encounter for other specified surgical aftercare: Secondary | ICD-10-CM

## 2023-04-30 DIAGNOSIS — M7989 Other specified soft tissue disorders: Secondary | ICD-10-CM

## 2023-04-30 LAB — POCT URINALYSIS DIPSTICK
Bilirubin, UA: NEGATIVE
Blood, UA: NEGATIVE
Glucose, UA: NEGATIVE
Ketones, UA: NEGATIVE
Leukocytes, UA: NEGATIVE
Nitrite, UA: NEGATIVE
Protein, UA: POSITIVE — AB
Spec Grav, UA: 1.01 (ref 1.010–1.025)
Urobilinogen, UA: 0.2 E.U./dL
pH, UA: 6.5 (ref 5.0–8.0)

## 2023-04-30 MED ORDER — FUROSEMIDE 20 MG PO TABS: 20.0000 mg | ORAL_TABLET | Freq: Every day | ORAL | 0 refills | Status: DC

## 2023-05-08 ENCOUNTER — Encounter: Payer: Self-pay | Admitting: Gastroenterology

## 2023-05-08 ENCOUNTER — Ambulatory Visit (INDEPENDENT_AMBULATORY_CARE_PROVIDER_SITE_OTHER): Payer: Medicaid Other | Admitting: Gastroenterology

## 2023-05-08 VITALS — BP 133/86 | HR 102 | Temp 97.9°F | Ht 65.0 in | Wt 205.0 lb

## 2023-05-08 DIAGNOSIS — K8050 Calculus of bile duct without cholangitis or cholecystitis without obstruction: Secondary | ICD-10-CM | POA: Diagnosis not present

## 2023-05-08 DIAGNOSIS — K625 Hemorrhage of anus and rectum: Secondary | ICD-10-CM | POA: Diagnosis not present

## 2023-05-08 NOTE — Progress Notes (Signed)
Arlyss Repress, MD [address]  [address]  [place], [place] [address]  Main: [phone]  Fax: [phone]    Gastroenterology Consultation  Referring Provider:     Sallyanne Kuster, NP Primary Care Physician:  Sallyanne Kuster, NP Primary Gastroenterologist:  Dr. Arlyss Repress Reason for Consultation: Epigastric pain        HPI:   Kathryn Estrada is a 27 y.o. female referred by Sallyanne Kuster, NP  for consultation & management of epigastric pain.  Patient reports that since April, she has been experiencing acute episodes of sharp/stabbing epigastric pain radiating to right subcostal margin, right shoulder and right upper back associated with nausea.  She had 4-5 episodes to date.  Each episode usually lasts about 30 minutes and most recent 1 lasted for few hours.  She had heartburn from 36 weeks to 38 weeks and took over-the-counter Nexium which provided relief.  These acute episodes are different from heartburn.  Patient never experienced heartburn before her pregnancy.  She did not have similar episodes of epigastric pain prior to her pregnancy.  She recently delivered a healthy, full-term baby, underwent a C-section.  She has 2 kids.  She does acknowledge consumption of simple carbs, carbonated beverages, sugary drinks, fast foods.  She also notices lower abdominal cramps whenever she tries to hold her stool when there is bathroom access issue.  Generally, she denies having any constipation or diarrhea.  She denies any abdominal bloating.  She reports having hemorrhoidal symptoms since her first pregnancy and worse during pregnancy and notices occasional bright red blood per rectum.  She denies any rectal urgency.  Patient does not smoke or drink alcohol  NSAIDs: None  Antiplts/Anticoagulants/Anti thrombotics: None  GI Procedures: None  Past Medical History:  Diagnosis Date   Anemia    Complication of anesthesia    takes alot of anesthetic for dental  procedure to become numb   Elevated blood pressure reading    Family history of adverse reaction to anesthesia    takes alot of anesthetic at dentist to become numb   GERD (gastroesophageal reflux disease)    Raynaud's syndrome without gangrene     Past Surgical History:  Procedure Laterality Date   CESAREAN SECTION N/A 04/03/2021   Procedure: CESAREAN SECTION;  Surgeon: Natale Milch, MD;  Location: [facility] ORS;  Service: Obstetrics;  Laterality: N/A;   CESAREAN SECTION N/A 04/24/2023   Procedure: REPEAT CESAREAN SECTION;  Surgeon: Hildred Laser, MD;  Location: [facility] ORS;  Service: Obstetrics;  Laterality: N/A;   WISDOM TOOTH EXTRACTION     four;- age 35, 80, 38, 62     Current Outpatient Medications:    Prenatal Vit-Fe Fumarate-FA (MULTIVITAMIN-PRENATAL) 27-0.8 MG TABS tablet, Take 1 tablet by mouth daily at 12 noon., Disp: , Rfl:    Family History  Problem Relation Age of Onset   Neurofibromatosis Mother    Anemia Mother    Neurofibromatosis Sister    Neurofibromatosis Brother    Neurofibromatosis Brother    Schizophrenia Maternal Grandfather    Kidney disease Paternal Grandmother    Cancer Paternal Grandfather 71       colon     Social History   Tobacco Use   Smoking status: Every Day    Types: E-cigarettes   Smokeless tobacco: Never   Tobacco comments:    Quit cigarettes 08-28-2020, now vapes   Vaping Use   Vaping Use: Former   Quit date: 08/28/2020   Devices: vape  Substance Use Topics   Alcohol use: Not Currently    Comment: ocassinally/ once a month    Drug use: Never    Allergies as of 05/08/2023   (No Known Allergies)    Review of Systems:    All systems reviewed and negative except where noted in HPI.   Physical Exam:  BP (!) 142/99 (BP Location: Right Arm, Patient Position: Sitting, Cuff Size: Normal)   Pulse (!) 105   Temp 97.9 F (36.6 C) (Oral)   Ht 5\' 5"  (1.651 m)   Wt 205 lb (93 kg)   BMI 34.11 kg/m  No LMP recorded.  General:    Alert,  Well-developed, well-nourished, pleasant and cooperative in NAD Head:  Normocephalic and atraumatic. Eyes:  Sclera clear, no icterus.   Conjunctiva pink. Ears:  Normal auditory acuity. Nose:  No deformity, discharge, or lesions. Mouth:  No deformity or lesions,oropharynx pink & moist. Neck:  Supple; no masses or thyromegaly. Lungs:  Respirations even and unlabored.  Clear throughout to auscultation.   No wheezes, crackles, or rhonchi. No acute distress. Heart:  Regular rate and rhythm; no murmurs, clicks, rubs, or gallops. Abdomen:  Normal bowel sounds. Soft, mild epigastric tenderness and non-distended without masses, hepatosplenomegaly or hernias noted.  No guarding or rebound tenderness.   Rectal: Not performed Msk:  Symmetrical without gross deformities. Good, equal movement & strength bilaterally. Pulses:  Normal pulses noted. Extremities:  No clubbing or edema.  No cyanosis. Neurologic:  Alert and oriented x3;  grossly normal neurologically. Skin:  Intact without significant lesions or rashes. No jaundice. Psych:  Alert and cooperative. Normal mood and affect.  Imaging Studies: No abdominal imaging  Assessment and Plan:   Kathryn Estrada is a 26 y.o. pleasant Caucasian female with BMI 34 is seen in consultation for approximately 2 months history of acute episodes of epigastric pain radiating to right upper quadrant and right shoulder associated with nausea.  These episodes are consistent with biliary colic  Epigastric/right upper quadrant pain Recommend right upper quadrant ultrasound to evaluate for cholelithiasis Normal LFTs Referral to general surgery Discussed about healthy diet, cut back on simple carbs, avoid carbonated beverages, sugary drinks, fast foods, red meat  Rectal bleeding Most likely secondary to hemorrhoids Patient will reach out to me if the bleeding is persistent and we can further evaluate with flexible sigmoidoscopy   Follow up based  on the above workup   Arlyss Repress, MD

## 2023-05-08 NOTE — Patient Instructions (Addendum)
Your ultrasound is schedule for you on 05/09/2023 at 9:45am for a 10:00am at [facility]. Nothing to eat or drink after midnight. If you need to reschedule please call [phone]

## 2023-05-09 ENCOUNTER — Ambulatory Visit
Admission: RE | Admit: 2023-05-09 | Discharge: 2023-05-09 | Disposition: A | Discharge status: Home / Self Care | Payer: Medicaid Other | Source: Ambulatory Visit | Attending: Gastroenterology | Admitting: Gastroenterology

## 2023-05-09 DIAGNOSIS — K8050 Calculus of bile duct without cholangitis or cholecystitis without obstruction: Secondary | ICD-10-CM | POA: Diagnosis present

## 2023-05-12 ENCOUNTER — Telehealth: Payer: Self-pay

## 2023-05-12 DIAGNOSIS — K8050 Calculus of bile duct without cholangitis or cholecystitis without obstruction: Secondary | ICD-10-CM

## 2023-05-12 NOTE — Telephone Encounter (Signed)
-----   Message from Toney Reil, MD sent at 05/11/2023 11:40 PM EDT ----- Ultrasound of gallbladder did not reveal any stones.  Recommend H. pylori breath test and please tell her to keep her appointment with general surgery  RV

## 2023-05-12 NOTE — Telephone Encounter (Signed)
Called and left a message for call back  

## 2023-05-12 NOTE — Telephone Encounter (Signed)
Patient verbalized understanding of results. She will call and make appointment with general surgery and go for labs

## 2023-06-04 ENCOUNTER — Ambulatory Visit: Payer: Medicaid Other | Admitting: Obstetrics and Gynecology

## 2023-06-05 ENCOUNTER — Telehealth: Payer: Self-pay

## 2023-06-05 NOTE — Telephone Encounter (Signed)

## 2023-06-13 ENCOUNTER — Other Ambulatory Visit: Payer: Self-pay

## 2023-06-16 ENCOUNTER — Ambulatory Visit: Payer: Medicaid Other | Admitting: Physician Assistant

## 2023-06-16 NOTE — Progress Notes (Deleted)
Kathryn Amy, PA-C [address]  [address]  [place], [place] [address]  Main: [phone]  Fax: [phone]   Primary Care Physician: Sallyanne Kuster, NP  Primary Gastroenterologist:  Dr. Lannette Donath   CC: Follow-up epigastric pain  HPI: Kathryn Estrada is a 27 y.o. female returns for 1 month follow-up of epigastric pain.  She saw Dr. Allegra Lai 05/08/2023 to evaluate epigastric pain, RUQ pain, and nausea.  LFTs normal.  RUQ ultrasound 05/09/2023 showed no gallstones.  Normal common bile duct 3.3 mm.  Normal liver.  Incidental 2 cm benign appearing right kidney cyst.  No acute abnormality to explain upper abdominal pain.  Lab 04/25/2023 showed moderate anemia with hemoglobin 9.1, hematocrit 28, MCV 84.  Mild Leukos cytosis with white count 13.1.  Labs 04/22/2023 showed hemoglobin 10.5.  Mild hypokalemia with potassium 3.4.  Low calcium 7.6.  Normal kidney function.  Patient had C-section 04/24/2023.  Delivered her second child at 39 weeks.  Currently breast-feeding?  She takes meloxicam daily.  Also takes prenatal vitamin.      Current Outpatient Medications  Medication Sig Dispense Refill   meloxicam (MOBIC) 15 MG tablet Take 15 mg by mouth daily.     Prenatal Vit-Fe Fumarate-FA (MULTIVITAMIN-PRENATAL) 27-0.8 MG TABS tablet Take 1 tablet by mouth daily at 12 noon.     No current facility-administered medications for this visit.    Allergies as of 06/16/2023   (No Known Allergies)    Past Medical History:  Diagnosis Date   Anemia    Complication of anesthesia    takes alot of anesthetic for dental procedure to become numb   Elevated blood pressure reading    Family history of adverse reaction to anesthesia    takes alot of anesthetic at dentist to become numb   GERD (gastroesophageal reflux disease)    Raynaud's syndrome without gangrene     Past Surgical History:  Procedure Laterality Date   CESAREAN SECTION N/A 04/03/2021   Procedure: CESAREAN  SECTION;  Surgeon: Natale Milch, MD;  Location: [facility] ORS;  Service: Obstetrics;  Laterality: N/A;   CESAREAN SECTION N/A 04/24/2023   Procedure: REPEAT CESAREAN SECTION;  Surgeon: Hildred Laser, MD;  Location: [facility] ORS;  Service: Obstetrics;  Laterality: N/A;   WISDOM TOOTH EXTRACTION     four;- age 36, 38, 65, 71    Review of Systems:    All systems reviewed and negative except where noted in HPI.   Physical Examination:   There were no vitals taken for this visit.  General: Well-nourished, well-developed in no acute distress.  Eyes: No icterus. Conjunctivae pink. Mouth: Oropharyngeal mucosa moist and pink , no lesions erythema or exudate. Lungs: Clear to auscultation bilaterally. Non-labored. Heart: Regular rate and rhythm, no murmurs rubs or gallops.  Abdomen: Bowel sounds are normal; Abdomen is Soft; No hepatosplenomegaly, masses or hernias;  No Abdominal Tenderness; No guarding or rebound tenderness. Extremities: No lower extremity edema. No clubbing or deformities. Neuro: Alert and oriented x 3.  Grossly intact. Skin: Warm and dry, no jaundice.   Psych: Alert and cooperative, normal mood and affect.   Imaging Studies: No results found.  Assessment and Plan:   Kathryn Estrada is a 27 y.o. y/o female turns for follow-up of upper abdominal pain.  Recent ultrasound showed no gallstones.  She is anemic.  Had a C-section 04/24/2023.  Currently takes meloxicam NSAID.  1.  Epigastric pain; evaluate for peptic ulcer  Avoid NSAIDs  Stop meloxicam  Prilosec 20 Mg daily  Scheduling EGD I discussed risks of EGD with patient to include risk of bleeding, perforation, and risk of sedation.   Patient expressed understanding and agrees to proceed with EGD.   2.  Nausea   3.  Anemia  Labs: CBC, iron panel, ferritin, B12, folate, and celiac lab.    Kathryn Amy, PA-C  Follow up in ***  BP check ***

## 2023-06-19 ENCOUNTER — Encounter: Payer: Self-pay | Admitting: Physician Assistant

## 2023-06-19 ENCOUNTER — Ambulatory Visit: Payer: Medicaid Other | Admitting: Physician Assistant

## 2023-06-19 VITALS — BP 125/90 | HR 91 | Temp 98.3°F | Resp 16 | Ht 65.0 in | Wt 207.0 lb

## 2023-06-19 DIAGNOSIS — N390 Urinary tract infection, site not specified: Secondary | ICD-10-CM

## 2023-06-19 DIAGNOSIS — R300 Dysuria: Secondary | ICD-10-CM

## 2023-06-19 DIAGNOSIS — Z131 Encounter for screening for diabetes mellitus: Secondary | ICD-10-CM | POA: Diagnosis not present

## 2023-06-19 DIAGNOSIS — R030 Elevated blood-pressure reading, without diagnosis of hypertension: Secondary | ICD-10-CM

## 2023-06-19 DIAGNOSIS — R5383 Other fatigue: Secondary | ICD-10-CM

## 2023-06-19 DIAGNOSIS — F53 Postpartum depression: Secondary | ICD-10-CM

## 2023-06-19 DIAGNOSIS — E538 Deficiency of other specified B group vitamins: Secondary | ICD-10-CM

## 2023-06-19 DIAGNOSIS — R319 Hematuria, unspecified: Secondary | ICD-10-CM

## 2023-06-19 DIAGNOSIS — E559 Vitamin D deficiency, unspecified: Secondary | ICD-10-CM

## 2023-06-19 DIAGNOSIS — R946 Abnormal results of thyroid function studies: Secondary | ICD-10-CM

## 2023-06-19 DIAGNOSIS — E782 Mixed hyperlipidemia: Secondary | ICD-10-CM

## 2023-06-19 LAB — POCT URINALYSIS DIPSTICK
Bilirubin, UA: NEGATIVE
Glucose, UA: NEGATIVE
Leukocytes, UA: NEGATIVE
Nitrite, UA: NEGATIVE
Protein, UA: NEGATIVE
Spec Grav, UA: 1.01 (ref 1.010–1.025)
Urobilinogen, UA: 0.2 E.U./dL
pH, UA: 6 (ref 5.0–8.0)

## 2023-06-19 MED ORDER — BUPROPION HCL ER (XL) 150 MG PO TB24
150.0000 mg | ORAL_TABLET | Freq: Every day | ORAL | 2 refills | Status: DC
Start: 2023-06-19 — End: 2023-07-11

## 2023-06-19 NOTE — Progress Notes (Signed)
[facility] [address] [place], [place] [address]  Internal MEDICINE  Office Visit Note  Patient Name: Kathryn Estrada  [id number]  [id number]  Date of Service: 06/19/2023  Chief Complaint  Patient presents with   Acute Visit   Urinary Frequency   Depression    Depression since giving birth and weight gain. Patient states she is constantly hungry even after eating a full meal.     HPI Pt is here for a sick visit. -8 weeks since c-section, baby doing well and initial postpartum visit with OBGYN went well -Unfortunately more recently she has been feeling more depressed. She did not discuss at visit as she felt ok at the time. -multiple things contributing -stays home with 2 kids, doesn't want to go back to work at this point. Has been on nicotine for years. Vaping all day long. Never been able to quit. No cigs ever. -She is also trying to lose weight and has been unsuccessful and feels she is hungry all the time. She is also drinking a lot of water and urinating frequently. She is on her period and may be altering urine results. No burning or discomfort.  -not breast feeding -Kids are driving force to get things done, but unsure if she would be motivated otherwise. She reports having a hard time answering PHQ due to having infant at home. Denies any thoughts of self harm -some blurred vision for many months but went to eye doctor halfway through pregnancy and everything looked ok -Also some hand tingling and reports hx of b12 deficiency during pregnancy and did a few injections worth then stopped -Her BP is borderline in office and she will start monitoring BP at home     06/19/2023    9:29 AM 08/20/2022    9:25 AM 05/29/2022    9:15 AM 11/05/2021   11:29 AM 05/09/2020    3:37 PM  Depression screen PHQ 2/9  Decreased Interest 1 0 0 0 0  Down, Depressed, Hopeless 1 0 0 0 0  PHQ - 2 Score 2 0 0 0 0  Altered sleeping 1      Tired, decreased energy 1       Change in appetite 1      Feeling bad or failure about yourself  1      Trouble concentrating 1      Moving slowly or fidgety/restless 1      Suicidal thoughts 0      PHQ-9 Score 8         Current Medication:  Outpatient Encounter Medications as of 06/19/2023  Medication Sig   buPROPion (WELLBUTRIN XL) 150 MG 24 hr tablet Take 1 tablet (150 mg total) by mouth daily.   meloxicam (MOBIC) 15 MG tablet Take 15 mg by mouth daily.   Prenatal Vit-Fe Fumarate-FA (MULTIVITAMIN-PRENATAL) 27-0.8 MG TABS tablet Take 1 tablet by mouth daily at 12 noon.   No facility-administered encounter medications on file as of 06/19/2023.      Medical History: Past Medical History:  Diagnosis Date   Anemia    Complication of anesthesia    takes alot of anesthetic for dental procedure to become numb   Elevated blood pressure reading    Family history of adverse reaction to anesthesia    takes alot of anesthetic at dentist to become numb   GERD (gastroesophageal reflux disease)    Raynaud's syndrome without gangrene      Vital Signs: BP (!) 125/90   Pulse  91   Temp 98.3 F (36.8 C)   Resp 16   Ht 5\' 5"  (1.651 m)   Wt 207 lb (93.9 kg)   SpO2 92%   BMI 34.45 kg/m    Review of Systems  Constitutional:  Positive for appetite change. Negative for chills and fatigue.  HENT:  Negative for congestion, postnasal drip, rhinorrhea, sneezing and sore throat.   Eyes:  Positive for visual disturbance. Negative for redness.  Respiratory:  Negative for cough, chest tightness and shortness of breath.   Cardiovascular:  Negative for chest pain and palpitations.  Gastrointestinal:  Negative for abdominal pain, constipation, diarrhea, nausea and vomiting.  Endocrine: Positive for polydipsia, polyphagia and polyuria.  Genitourinary:  Negative for dysuria and frequency.  Musculoskeletal:  Negative for arthralgias, back pain, joint swelling and neck pain.  Skin:  Negative for rash.  Neurological:  Positive  for numbness. Negative for tremors.  Hematological:  Negative for adenopathy. Does not bruise/bleed easily.  Psychiatric/Behavioral:  Positive for behavioral problems (Depression). Negative for sleep disturbance and suicidal ideas. The patient is not nervous/anxious.     Physical Exam Vitals and nursing note reviewed.  Constitutional:      Appearance: Normal appearance.  HENT:     Head: Normocephalic and atraumatic.  Eyes:     Pupils: Pupils are equal, round, and reactive to light.  Cardiovascular:     Rate and Rhythm: Normal rate and regular rhythm.  Pulmonary:     Effort: Pulmonary effort is normal.     Breath sounds: Normal breath sounds.  Musculoskeletal:        General: Normal range of motion.  Skin:    General: Skin is warm and dry.  Neurological:     Mental Status: She is alert and oriented to person, place, and time.  Psychiatric:        Thought Content: Thought content normal.     Comments: Depressed mood, borderline tears       Assessment/Plan: 1. Postpartum depression Since patient is not breast feeding will start on wellbutrin to help with depression as well as possible benefits of weight loss and vaping cessation. Advised to call if any problems. Should also have postpartum visit with OBGYN still - buPROPion (WELLBUTRIN XL) 150 MG 24 hr tablet; Take 1 tablet (150 mg total) by mouth daily.  Dispense: 30 tablet; Refill: 2  2. Diabetes mellitus screening Given polyphagia and polyuria and Fhx of DM will go ahead and screen - Hgb A1C w/o eAG  3. Elevated blood pressure reading Will start monitoring BP at home  4. B12 deficiency - B12 and Folate Panel  5. Vitamin D deficiency - VITAMIN D 25 Hydroxy (Vit-D Deficiency, Fractures)  6. Mixed hyperlipidemia - Lipid Panel With LDL/HDL Ratio  7. Abnormal thyroid exam - TSH + free T4  8. Other fatigue - CBC w/Diff/Platelet - Comprehensive metabolic panel - Hgb A1C w/o eAG - TSH + free T4 - Lipid Panel With  LDL/HDL Ratio - VITAMIN D 25 Hydroxy (Vit-D Deficiency, Fractures) - B12 and Folate Panel - Fe+TIBC+Fer  9. Urinary tract infection with hematuria, site unspecified - CULTURE, URINE COMPREHENSIVE  10. Dysuria - POCT Urinalysis Dipstick   General Counseling: Kathryn Estrada wonders understanding of the findings of todays visit and agrees with plan of treatment. I have discussed any further diagnostic evaluation that may be needed or ordered today. We also reviewed her medications today. she has been encouraged to call the office with any questions or concerns that should arise  related to todays visit.    Counseling:    Orders Placed This Encounter  Procedures   CULTURE, URINE COMPREHENSIVE   CBC w/Diff/Platelet   Comprehensive metabolic panel   Hgb A1C w/o eAG   TSH + free T4   Lipid Panel With LDL/HDL Ratio   VITAMIN D 25 Hydroxy (Vit-D Deficiency, Fractures)   B12 and Folate Panel   Fe+TIBC+Fer   POCT Urinalysis Dipstick    Meds ordered this encounter  Medications   buPROPion (WELLBUTRIN XL) 150 MG 24 hr tablet    Sig: Take 1 tablet (150 mg total) by mouth daily.    Dispense:  30 tablet    Refill:  2    Time spent:35 Minutes

## 2023-06-24 LAB — CULTURE, URINE COMPREHENSIVE

## 2023-06-26 LAB — COMPREHENSIVE METABOLIC PANEL WITH GFR
ALT: 11 IU/L (ref 0–32)
AST: 11 IU/L (ref 0–40)
Albumin: 4 g/dL (ref 4.0–5.0)
Alkaline Phosphatase: 99 IU/L (ref 44–121)
BUN/Creatinine Ratio: 13 (ref 9–23)
BUN: 12 mg/dL (ref 6–20)
Bilirubin Total: 0.3 mg/dL (ref 0.0–1.2)
CO2: 24 mmol/L (ref 20–29)
Calcium: 9.5 mg/dL (ref 8.7–10.2)
Chloride: 104 mmol/L (ref 96–106)
Creatinine, Ser: 0.94 mg/dL (ref 0.57–1.00)
Globulin, Total: 2.6 g/dL (ref 1.5–4.5)
Glucose: 104 mg/dL — ABNORMAL HIGH (ref 70–99)
Potassium: 3.8 mmol/L (ref 3.5–5.2)
Sodium: 142 mmol/L (ref 134–144)
Total Protein: 6.6 g/dL (ref 6.0–8.5)
eGFR: 85 mL/min/1.73

## 2023-06-26 LAB — CBC WITH DIFFERENTIAL/PLATELET
Basophils Absolute: 0 10*3/uL (ref 0.0–0.2)
Basos: 0 %
EOS (ABSOLUTE): 0.2 10*3/uL (ref 0.0–0.4)
Eos: 2 %
Hematocrit: 37.9 % (ref 34.0–46.6)
Hemoglobin: 11.9 g/dL (ref 11.1–15.9)
Immature Grans (Abs): 0 10*3/uL (ref 0.0–0.1)
Immature Granulocytes: 0 %
Lymphocytes Absolute: 1.5 10*3/uL (ref 0.7–3.1)
Lymphs: 23 %
MCH: 25.6 pg — ABNORMAL LOW (ref 26.6–33.0)
MCHC: 31.4 g/dL — ABNORMAL LOW (ref 31.5–35.7)
MCV: 82 fL (ref 79–97)
Monocytes Absolute: 0.3 10*3/uL (ref 0.1–0.9)
Monocytes: 5 %
Neutrophils Absolute: 4.6 10*3/uL (ref 1.4–7.0)
Neutrophils: 70 %
Platelets: 297 10*3/uL (ref 150–450)
RBC: 4.65 x10E6/uL (ref 3.77–5.28)
RDW: 13.3 % (ref 11.7–15.4)
WBC: 6.7 10*3/uL (ref 3.4–10.8)

## 2023-06-26 LAB — LIPID PANEL WITH LDL/HDL RATIO
Cholesterol, Total: 205 mg/dL — ABNORMAL HIGH (ref 100–199)
HDL: 52 mg/dL
LDL Chol Calc (NIH): 132 mg/dL — ABNORMAL HIGH (ref 0–99)
LDL/HDL Ratio: 2.5 ratio (ref 0.0–3.2)
Triglycerides: 118 mg/dL (ref 0–149)
VLDL Cholesterol Cal: 21 mg/dL (ref 5–40)

## 2023-06-26 LAB — B12 AND FOLATE PANEL
Folate: 6.4 ng/mL (ref >3.0)
Vitamin B-12: 532 pg/mL (ref 232–1245)

## 2023-06-26 LAB — TSH+FREE T4
Free T4: 1.11 ng/dL (ref 0.82–1.77)
TSH: 1.23 u[IU]/mL (ref 0.450–4.500)

## 2023-06-26 LAB — VITAMIN D 25 HYDROXY (VIT D DEFICIENCY, FRACTURES): Vit D, 25-Hydroxy: 28.1 ng/mL — ABNORMAL LOW (ref 30.0–100.0)

## 2023-06-26 LAB — IRON,TIBC AND FERRITIN PANEL
Ferritin: 28 ng/mL (ref 15–150)
Iron Saturation: 17 % (ref 15–55)
Iron: 49 ug/dL (ref 27–159)
Total Iron Binding Capacity: 294 ug/dL (ref 250–450)
UIBC: 245 ug/dL (ref 131–425)

## 2023-06-26 LAB — HGB A1C W/O EAG: Hgb A1c MFr Bld: 5.3 % (ref 4.8–5.6)

## 2023-07-04 ENCOUNTER — Encounter: Payer: Self-pay | Admitting: Physician Assistant

## 2023-07-04 ENCOUNTER — Telehealth: Payer: Self-pay | Admitting: Physician Assistant

## 2023-07-04 ENCOUNTER — Ambulatory Visit: Payer: Medicaid Other | Admitting: Physician Assistant

## 2023-07-04 VITALS — BP 115/85 | HR 96 | Temp 98.1°F | Resp 16 | Ht 65.0 in | Wt 207.2 lb

## 2023-07-04 DIAGNOSIS — H538 Other visual disturbances: Secondary | ICD-10-CM | POA: Diagnosis not present

## 2023-07-04 DIAGNOSIS — E559 Vitamin D deficiency, unspecified: Secondary | ICD-10-CM

## 2023-07-04 DIAGNOSIS — Z8279 Family history of other congenital malformations, deformations and chromosomal abnormalities: Secondary | ICD-10-CM

## 2023-07-04 DIAGNOSIS — R202 Paresthesia of skin: Secondary | ICD-10-CM

## 2023-07-04 DIAGNOSIS — G8929 Other chronic pain: Secondary | ICD-10-CM

## 2023-07-04 DIAGNOSIS — F53 Postpartum depression: Secondary | ICD-10-CM | POA: Diagnosis not present

## 2023-07-04 DIAGNOSIS — R519 Headache, unspecified: Secondary | ICD-10-CM

## 2023-07-04 DIAGNOSIS — R200 Anesthesia of skin: Secondary | ICD-10-CM

## 2023-07-04 NOTE — Telephone Encounter (Signed)
Neurology referral sent via [facility] to [facility] 

## 2023-07-04 NOTE — Progress Notes (Signed)
[facility] [address] [place], [place] [address]  Internal MEDICINE  Office Visit Note  Patient Name: Kathryn Estrada  [id number]  [id number]  Date of Service: 07/04/2023  Chief Complaint  Patient presents with   Follow-up    Review Labs   Gastroesophageal Reflux    HPI Pt is here for routine follow up -Needs neurology referral resent. Has blurred vision and headaches for a many months now. Eye doctor already evaluated her and said everything looked ok. She was referred many months ago but could not wait for appt and went to ED instead, but now referral is out of date. She is ready to schedule now. -Feeling better on wellbutrin, mood better. Has not really helped with smoking vaping at this point, but is hoping to make progress with this -Labs reviewed and overall look good. She will work on diet to help cholesterol and will start vitamin D supplement  Current Medication: Outpatient Encounter Medications as of 07/04/2023  Medication Sig   buPROPion (WELLBUTRIN XL) 150 MG 24 hr tablet Take 1 tablet (150 mg total) by mouth daily.   meloxicam (MOBIC) 15 MG tablet Take 15 mg by mouth daily.   Prenatal Vit-Fe Fumarate-FA (MULTIVITAMIN-PRENATAL) 27-0.8 MG TABS tablet Take 1 tablet by mouth daily at 12 noon.   No facility-administered encounter medications on file as of 07/04/2023.    Surgical History: Past Surgical History:  Procedure Laterality Date   CESAREAN SECTION N/A 04/03/2021   Procedure: CESAREAN SECTION;  Surgeon: Natale Milch, MD;  Location: [facility] ORS;  Service: Obstetrics;  Laterality: N/A;   CESAREAN SECTION N/A 04/24/2023   Procedure: REPEAT CESAREAN SECTION;  Surgeon: Hildred Laser, MD;  Location: [facility] ORS;  Service: Obstetrics;  Laterality: N/A;   WISDOM TOOTH EXTRACTION     four;- age 64, 72, 56, 31    Medical History: Past Medical History:  Diagnosis Date   Anemia    Complication of anesthesia    takes alot of anesthetic for  dental procedure to become numb   Elevated blood pressure reading    Family history of adverse reaction to anesthesia    takes alot of anesthetic at dentist to become numb   GERD (gastroesophageal reflux disease)    Raynaud's syndrome without gangrene     Family History: Family History  Problem Relation Age of Onset   Neurofibromatosis Mother    Anemia Mother    Neurofibromatosis Sister    Neurofibromatosis Brother    Neurofibromatosis Brother    Schizophrenia Maternal Grandfather    Kidney disease Paternal Grandmother    Cancer Paternal Grandfather 89       colon    Social History   Socioeconomic History   Marital status: Married    Spouse name: Joselyn Glassman   Number of children: 1   Years of education: 13.5   Highest education level: Not on file  Occupational History   Occupation: stay at home mom  Tobacco Use   Smoking status: Every Day    Types: E-cigarettes   Smokeless tobacco: Never   Tobacco comments:    Quit cigarettes 08-28-2020, now vapes   Vaping Use   Vaping status: Former   Quit date: 08/28/2020   Devices: vape   Substance and Sexual Activity   Alcohol use: Not Currently    Comment: ocassinally/ once a month    Drug use: Never   Sexual activity: Yes    Partners: Male    Birth control/protection: None    Comment:  undecided  Other Topics Concern   Not on file  Social History Narrative   Not on file   Social Determinants of Health   Financial Resource Strain: Low Risk  (09/18/2022)   Overall Financial Resource Strain (CARDIA)    Difficulty of Paying Living Expenses: Not very hard  Food Insecurity: No Food Insecurity (04/24/2023)   Hunger Vital Sign    Worried About Running Out of Food in the Last Year: Never true    Ran Out of Food in the Last Year: Never true  Transportation Needs: No Transportation Needs (04/24/2023)   PRAPARE - Administrator, Civil Service (Medical): No    Lack of Transportation (Non-Medical): No  Physical Activity:  Inactive (09/18/2022)   Exercise Vital Sign    Days of Exercise per Week: 0 days    Minutes of Exercise per Session: 0 min  Stress: No Stress Concern Present (09/18/2022)   [employer] of Occupational Health - Occupational Stress Questionnaire    Feeling of Stress : Not at all  Social Connections: Moderately Integrated (09/18/2022)   Social Connection and Isolation Panel [NHANES]    Frequency of Communication with Friends and Family: More than three times a week    Frequency of Social Gatherings with Friends and Family: Three times a week    Attends Religious Services: More than 4 times per year    Active Member of Clubs or Organizations: No    Attends Banker Meetings: Never    Marital Status: Married  Catering manager Violence: Not At Risk (04/24/2023)   Humiliation, Afraid, Rape, and Kick questionnaire    Fear of Current or Ex-Partner: No    Emotionally Abused: No    Physically Abused: No    Sexually Abused: No      Review of Systems  Constitutional:  Positive for fatigue. Negative for chills and unexpected weight change.  HENT:  Negative for congestion, rhinorrhea, sneezing and sore throat.   Eyes:  Positive for visual disturbance. Negative for redness.  Respiratory:  Negative for cough, chest tightness and shortness of breath.   Cardiovascular:  Negative for chest pain and palpitations.  Gastrointestinal:  Negative for abdominal pain, constipation, diarrhea, nausea and vomiting.  Genitourinary:  Negative for dysuria.  Musculoskeletal:  Negative for arthralgias, back pain, joint swelling and neck pain.  Skin:  Negative for rash.  Neurological:  Positive for numbness and headaches. Negative for tremors.  Hematological:  Negative for adenopathy. Does not bruise/bleed easily.  Psychiatric/Behavioral:  Positive for dysphoric mood. Negative for sleep disturbance and suicidal ideas. Behavioral problem: Depression.The patient is not nervous/anxious.     Vital  Signs: BP 115/85   Pulse 96   Temp 98.1 F (36.7 C)   Resp 16   Ht 5\' 5"  (1.651 m)   Wt 207 lb 3.2 oz (94 kg)   SpO2 99%   BMI 34.48 kg/m    Physical Exam Vitals and nursing note reviewed.  Constitutional:      Appearance: Normal appearance.  HENT:     Head: Normocephalic and atraumatic.  Eyes:     Extraocular Movements: Extraocular movements intact.  Cardiovascular:     Rate and Rhythm: Normal rate and regular rhythm.  Pulmonary:     Effort: Pulmonary effort is normal.     Breath sounds: Normal breath sounds.  Musculoskeletal:        General: Normal range of motion.  Neurological:     Mental Status: She is alert and oriented to  person, place, and time.  Psychiatric:        Thought Content: Thought content normal.        Judgment: Judgment normal.        Assessment/Plan: 1. Postpartum depression Improving, continue wellbutrin as before  2. Vitamin D deficiency Start vit D supplement, 1000IU daily  3. Family history of neurofibromatosis Will send new referral to neurology - Ambulatory referral to Neurology  4. Blurry vision, bilateral - Ambulatory referral to Neurology  5. Numbness and tingling - Ambulatory referral to Neurology  6. Chronic nonintractable headache, unspecified headache type - Ambulatory referral to Neurology   General Counseling: solyana villena understanding of the findings of todays visit and agrees with plan of treatment. I have discussed any further diagnostic evaluation that may be needed or ordered today. We also reviewed her medications today. she has been encouraged to call the office with any questions or concerns that should arise related to todays visit.    Orders Placed This Encounter  Procedures   Ambulatory referral to Neurology    No orders of the defined types were placed in this encounter.   This patient was seen by Lynn Ito, PA-C in collaboration with Dr. Beverely Risen as a part of collaborative care  agreement.   Total time spent:30 Minutes Time spent includes review of chart, medications, test results, and follow up plan with the patient.      Dr Lyndon Code Internal medicine

## 2023-07-10 ENCOUNTER — Telehealth: Payer: Self-pay | Admitting: Physician Assistant

## 2023-07-10 NOTE — Telephone Encounter (Signed)
Neurology appointment 07/08/2023 @ [facility]

## 2023-07-11 ENCOUNTER — Other Ambulatory Visit: Payer: Self-pay | Admitting: Physician Assistant

## 2023-07-11 DIAGNOSIS — F53 Postpartum depression: Secondary | ICD-10-CM

## 2023-07-15 ENCOUNTER — Other Ambulatory Visit: Payer: Self-pay | Admitting: Student

## 2023-07-15 DIAGNOSIS — M542 Cervicalgia: Secondary | ICD-10-CM

## 2023-07-15 DIAGNOSIS — M5412 Radiculopathy, cervical region: Secondary | ICD-10-CM

## 2023-07-15 DIAGNOSIS — M4807 Spinal stenosis, lumbosacral region: Secondary | ICD-10-CM

## 2023-07-15 DIAGNOSIS — M5416 Radiculopathy, lumbar region: Secondary | ICD-10-CM

## 2023-07-17 ENCOUNTER — Ambulatory Visit: Payer: Medicaid Other | Admitting: Physician Assistant

## 2023-07-28 ENCOUNTER — Encounter: Payer: Self-pay | Admitting: Student

## 2023-07-31 ENCOUNTER — Other Ambulatory Visit: Payer: Medicaid Other

## 2023-08-02 ENCOUNTER — Other Ambulatory Visit: Payer: Self-pay | Admitting: Certified Nurse Midwife

## 2023-08-02 DIAGNOSIS — O219 Vomiting of pregnancy, unspecified: Secondary | ICD-10-CM

## 2023-08-06 ENCOUNTER — Other Ambulatory Visit: Payer: Self-pay | Admitting: Otolaryngology

## 2023-08-06 DIAGNOSIS — H9192 Unspecified hearing loss, left ear: Secondary | ICD-10-CM

## 2023-08-06 DIAGNOSIS — H9312 Tinnitus, left ear: Secondary | ICD-10-CM

## 2023-08-06 DIAGNOSIS — H9202 Otalgia, left ear: Secondary | ICD-10-CM

## 2023-08-06 DIAGNOSIS — R42 Dizziness and giddiness: Secondary | ICD-10-CM

## 2023-08-26 NOTE — Progress Notes (Deleted)
Cardiology Office Note    Date:  08/26/2023   ID:  Tiaja Ravitz, DOB 1996-08-07, MRN [id number]  PCP:  Carlean Jews, PA-C  Cardiologist:  Debbe Odea, MD  Electrophysiologist:  None   Chief Complaint: ***  History of Present Illness:   Kathryn Estrada is a 27 y.o. female with history of ***  ***   Labs independently reviewed: 06/2023 - TC 205, TG 118, HDL 52, LDL 132, TSH normal, A1c 5.3, BUN 12, serum creatinine 0.94, potassium 3.8, albumin 4.0, AST/ALT normal, Hgb 11.9, PLT 297  Past Medical History:  Diagnosis Date   Anemia    Complication of anesthesia    takes alot of anesthetic for dental procedure to become numb   Elevated blood pressure reading    Family history of adverse reaction to anesthesia    takes alot of anesthetic at dentist to become numb   GERD (gastroesophageal reflux disease)    Raynaud's syndrome without gangrene     Past Surgical History:  Procedure Laterality Date   CESAREAN SECTION N/A 04/03/2021   Procedure: CESAREAN SECTION;  Surgeon: Natale Milch, MD;  Location: [facility] ORS;  Service: Obstetrics;  Laterality: N/A;   CESAREAN SECTION N/A 04/24/2023   Procedure: REPEAT CESAREAN SECTION;  Surgeon: Hildred Laser, MD;  Location: [facility] ORS;  Service: Obstetrics;  Laterality: N/A;   WISDOM TOOTH EXTRACTION     four;- age 74, 62, 46, 55    Current Medications: No outpatient medications have been marked as taking for the 08/27/23 encounter (Appointment) with Sondra Barges, PA-C.    Allergies:   Patient has no known allergies.   Social History   Socioeconomic History   Marital status: Married    Spouse name: Joselyn Glassman   Number of children: 1   Years of education: 13.5   Highest education level: Not on file  Occupational History   Occupation: stay at home mom  Tobacco Use   Smoking status: Every Day    Types: E-cigarettes   Smokeless tobacco: Never   Tobacco comments:    Quit cigarettes 08-28-2020, now  vapes   Vaping Use   Vaping status: Former   Quit date: 08/28/2020   Devices: vape   Substance and Sexual Activity   Alcohol use: Not Currently    Comment: ocassinally/ once a month    Drug use: Never   Sexual activity: Yes    Partners: Male    Birth control/protection: None    Comment: undecided  Other Topics Concern   Not on file  Social History Narrative   Not on file   Social Determinants of Health   Financial Resource Strain: Low Risk  (09/18/2022)   Overall Financial Resource Strain (CARDIA)    Difficulty of Paying Living Expenses: Not very hard  Food Insecurity: No Food Insecurity (04/24/2023)   Hunger Vital Sign    Worried About Running Out of Food in the Last Year: Never true    Ran Out of Food in the Last Year: Never true  Transportation Needs: No Transportation Needs (04/24/2023)   PRAPARE - Administrator, Civil Service (Medical): No    Lack of Transportation (Non-Medical): No  Physical Activity: Inactive (09/18/2022)   Exercise Vital Sign    Days of Exercise per Week: 0 days    Minutes of Exercise per Session: 0 min  Stress: No Stress Concern Present (09/18/2022)   [employer] of Occupational Health - Occupational Stress Questionnaire    Feeling of  Stress : Not at all  Social Connections: Moderately Integrated (09/18/2022)   Social Connection and Isolation Panel [NHANES]    Frequency of Communication with Friends and Family: More than three times a week    Frequency of Social Gatherings with Friends and Family: Three times a week    Attends Religious Services: More than 4 times per year    Active Member of Clubs or Organizations: No    Attends Banker Meetings: Never    Marital Status: Married     Family History:  The patient's family history includes Anemia in her mother; Cancer (age of onset: 34) in her paternal grandfather; Kidney disease in her paternal grandmother; Neurofibromatosis in her brother, brother, mother, and sister;  Schizophrenia in her maternal grandfather.  ROS:   12-point review of systems is negative unless otherwise noted in the HPI.   EKGs/Labs/Other Studies Reviewed:    Studies reviewed were summarized above. The additional studies were reviewed today: ***  EKG:  EKG is ordered today.  The EKG ordered today demonstrates ***  Recent Labs: 06/25/2023: ALT 11; BUN 12; Creatinine, Ser 0.94; Hemoglobin 11.9; Platelets 297; Potassium 3.8; Sodium 142; TSH 1.230  Recent Lipid Panel    Component Value Date/Time   CHOL 205 (H) 06/25/2023 1042   TRIG 118 06/25/2023 1042   HDL 52 06/25/2023 1042   LDLCALC 132 (H) 06/25/2023 1042    PHYSICAL EXAM:    VS:  There were no vitals taken for this visit.  BMI: There is no height or weight on file to calculate BMI.  Physical Exam  Wt Readings from Last 3 Encounters:  07/04/23 207 lb 3.2 oz (94 kg)  06/19/23 207 lb (93.9 kg)  05/08/23 205 lb (93 kg)     ASSESSMENT & PLAN:   ***   {Are you ordering a CV Procedure (e.g. stress test, cath, DCCV, TEE, etc)?   Press F2        :[id number]}     Disposition: F/u with Dr. Azucena Cecil or an APP in ***.   Medication Adjustments/Labs and Tests Ordered: Current medicines are reviewed at length with the patient today.  Concerns regarding medicines are outlined above. Medication changes, Labs and Tests ordered today are summarized above and listed in the Patient Instructions accessible in Encounters.   Signed, Eula Listen, PA-C 08/26/2023 10:34 AM     [facility] [address] [place], [place] [address] [phone]

## 2023-08-27 ENCOUNTER — Ambulatory Visit: Payer: Medicaid Other | Admitting: Physician Assistant

## 2023-09-03 ENCOUNTER — Ambulatory Visit
Admission: RE | Admit: 2023-09-03 | Discharge: 2023-09-03 | Disposition: A | Discharge status: Home / Self Care | Payer: Medicaid Other | Source: Ambulatory Visit | Attending: Otolaryngology | Admitting: Otolaryngology

## 2023-09-03 DIAGNOSIS — H9202 Otalgia, left ear: Secondary | ICD-10-CM

## 2023-09-03 DIAGNOSIS — H9312 Tinnitus, left ear: Secondary | ICD-10-CM

## 2023-09-03 DIAGNOSIS — R42 Dizziness and giddiness: Secondary | ICD-10-CM

## 2023-09-03 DIAGNOSIS — H9192 Unspecified hearing loss, left ear: Secondary | ICD-10-CM

## 2023-09-03 MED ORDER — GADOPICLENOL 0.5 MMOL/ML IV SOLN
10.0000 mL | Freq: Once | INTRAVENOUS | Status: AC | PRN
  Administered 2023-09-03: 9 mL via INTRAVENOUS

## 2023-09-04 ENCOUNTER — Encounter: Payer: Self-pay | Admitting: Physician Assistant

## 2023-09-04 ENCOUNTER — Ambulatory Visit: Payer: Medicaid Other | Admitting: Physician Assistant

## 2023-09-04 VITALS — BP 110/70 | HR 97 | Temp 98.3°F | Resp 16 | Ht 65.0 in | Wt 206.0 lb

## 2023-09-04 DIAGNOSIS — E559 Vitamin D deficiency, unspecified: Secondary | ICD-10-CM | POA: Diagnosis not present

## 2023-09-04 DIAGNOSIS — F53 Postpartum depression: Secondary | ICD-10-CM | POA: Diagnosis not present

## 2023-09-04 DIAGNOSIS — R519 Headache, unspecified: Secondary | ICD-10-CM

## 2023-09-04 DIAGNOSIS — H538 Other visual disturbances: Secondary | ICD-10-CM

## 2023-09-04 DIAGNOSIS — G8929 Other chronic pain: Secondary | ICD-10-CM

## 2023-09-04 NOTE — Progress Notes (Signed)
[facility] [address] [place], [place] [address]  Internal MEDICINE  Office Visit Note  Patient Name: Kathryn Estrada  [id number]  [id number]  Date of Service: 09/04/2023  Chief Complaint  Patient presents with   Follow-up   Gastroesophageal Reflux    HPI Pt is here for routine follow up -Stopped taking wellbutrin. Liked it but then started to feel like something stuck in her throat and googled it and found similar reports and so this made her anxious and stopped -Been to ENT twice, been to neurology, and had a MRI already due to these headaches and blurred vision where she states she feels like she is drunk. No clear answer still. She states the neurologist thinks it is a type of vestibular migraine and gave her effexor but she did not start this since she was started on wellbutrin and liked it. Has not been back to neurology to try effexor now that she has stopped wellbutrin and is going to try now and follow up with neurology. -Discussed effexor can help with anxiety/dep as well, though she is doing better with this now and PHQ is now at 0. -Continue to work on stopping vaping -clarified vit D supplement 1000IU daily     09/04/2023   10:52 AM 06/19/2023    9:29 AM 08/20/2022    9:25 AM  Depression screen PHQ 2/9  Decreased Interest 0 1 0  Down, Depressed, Hopeless 0 1 0  PHQ - 2 Score 0 2 0  Altered sleeping  1   Tired, decreased energy  1   Change in appetite  1   Feeling bad or failure about yourself   1   Trouble concentrating  1   Moving slowly or fidgety/restless  1   Suicidal thoughts  0   PHQ-9 Score  8      Current Medication: Outpatient Encounter Medications as of 09/04/2023  Medication Sig   meloxicam (MOBIC) 15 MG tablet Take 15 mg by mouth daily.   Prenatal Vit-Fe Fumarate-FA (MULTIVITAMIN-PRENATAL) 27-0.8 MG TABS tablet Take 1 tablet by mouth daily at 12 Estrada.   [DISCONTINUED] buPROPion (WELLBUTRIN XL) 150 MG 24 hr tablet TAKE 1  TABLET BY MOUTH EVERY DAY   No facility-administered encounter medications on file as of 09/04/2023.    Surgical History: Past Surgical History:  Procedure Laterality Date   CESAREAN SECTION N/A 04/03/2021   Procedure: CESAREAN SECTION;  Surgeon: Natale Milch, MD;  Location: [facility] ORS;  Service: Obstetrics;  Laterality: N/A;   CESAREAN SECTION N/A 04/24/2023   Procedure: REPEAT CESAREAN SECTION;  Surgeon: Hildred Laser, MD;  Location: [facility] ORS;  Service: Obstetrics;  Laterality: N/A;   WISDOM TOOTH EXTRACTION     four;- age 13, 27, 36, 40    Medical History: Past Medical History:  Diagnosis Date   Anemia    Complication of anesthesia    takes alot of anesthetic for dental procedure to become numb   Elevated blood pressure reading    Family history of adverse reaction to anesthesia    takes alot of anesthetic at dentist to become numb   GERD (gastroesophageal reflux disease)    Raynaud's syndrome without gangrene     Family History: Family History  Problem Relation Age of Onset   Neurofibromatosis Mother    Anemia Mother    Neurofibromatosis Sister    Neurofibromatosis Brother    Neurofibromatosis Brother    Schizophrenia Maternal Grandfather    Kidney disease Paternal Grandmother  Cancer Paternal Grandfather 58       colon    Social History   Socioeconomic History   Marital status: Married    Spouse name: Joselyn Glassman   Number of children: 1   Years of education: 13.5   Highest education level: Not on file  Occupational History   Occupation: stay at home mom  Tobacco Use   Smoking status: Every Day    Types: E-cigarettes   Smokeless tobacco: Never   Tobacco comments:    Quit cigarettes 08-28-2020, now vapes   Vaping Use   Vaping status: Former   Quit date: 08/28/2020   Devices: vape   Substance and Sexual Activity   Alcohol use: Not Currently    Comment: ocassinally/ once a month    Drug use: Never   Sexual activity: Yes    Partners: Male    Birth  control/protection: None    Comment: undecided  Other Topics Concern   Not on file  Social History Narrative   Not on file   Social Determinants of Health   Financial Resource Strain: Low Risk  (09/18/2022)   Overall Financial Resource Strain (CARDIA)    Difficulty of Paying Living Expenses: Not very hard  Food Insecurity: No Food Insecurity (04/24/2023)   Hunger Vital Sign    Worried About Running Out of Food in the Last Year: Never true    Ran Out of Food in the Last Year: Never true  Transportation Needs: No Transportation Needs (04/24/2023)   PRAPARE - Administrator, Civil Service (Medical): No    Lack of Transportation (Non-Medical): No  Physical Activity: Inactive (09/18/2022)   Exercise Vital Sign    Days of Exercise per Week: 0 days    Minutes of Exercise per Session: 0 min  Stress: No Stress Concern Present (09/18/2022)   [employer] of Occupational Health - Occupational Stress Questionnaire    Feeling of Stress : Not at all  Social Connections: Moderately Integrated (09/18/2022)   Social Connection and Isolation Panel [NHANES]    Frequency of Communication with Friends and Family: More than three times a week    Frequency of Social Gatherings with Friends and Family: Three times a week    Attends Religious Services: More than 4 times per year    Active Member of Clubs or Organizations: No    Attends Banker Meetings: Never    Marital Status: Married  Catering manager Violence: Not At Risk (04/24/2023)   Humiliation, Afraid, Rape, and Kick questionnaire    Fear of Current or Ex-Partner: No    Emotionally Abused: No    Physically Abused: No    Sexually Abused: No      Review of Systems  Constitutional:  Positive for fatigue. Negative for chills and unexpected weight change.  HENT:  Negative for congestion, rhinorrhea, sneezing and sore throat.   Eyes:  Positive for visual disturbance. Negative for redness.  Respiratory:  Negative for  cough, chest tightness and shortness of breath.   Cardiovascular:  Negative for chest pain and palpitations.  Gastrointestinal:  Negative for abdominal pain, constipation, diarrhea, nausea and vomiting.  Genitourinary:  Negative for dysuria.  Musculoskeletal:  Negative for arthralgias, back pain, joint swelling and neck pain.  Skin:  Negative for rash.  Neurological:  Positive for numbness and headaches. Negative for tremors.  Hematological:  Negative for adenopathy. Does not bruise/bleed easily.  Psychiatric/Behavioral:  Positive for dysphoric mood. Negative for sleep disturbance and suicidal ideas. Behavioral problem:  Depression.The patient is not nervous/anxious.     Vital Signs: BP 110/70   Pulse 97   Temp 98.3 F (36.8 C)   Resp 16   Ht 5\' 5"  (1.651 m)   Wt 206 lb (93.4 kg)   SpO2 97%   BMI 34.28 kg/m    Physical Exam Vitals and nursing note reviewed.  Constitutional:      Appearance: Normal appearance.  HENT:     Head: Normocephalic and atraumatic.  Eyes:     Extraocular Movements: Extraocular movements intact.  Cardiovascular:     Rate and Rhythm: Normal rate and regular rhythm.  Pulmonary:     Effort: Pulmonary effort is normal.     Breath sounds: Normal breath sounds.  Musculoskeletal:        General: Normal range of motion.  Neurological:     Mental Status: She is alert and oriented to person, place, and time.  Psychiatric:        Thought Content: Thought content normal.        Judgment: Judgment normal.        Assessment/Plan: 1. Postpartum depression Stopped wellbutrin due to possible S/E. Doing better now, but will try effexor prescribed by neurology and will follow up with them on this for possible migraine prevention and may help depression as well if symptoms returning. Will follow up as needed as they adjust dose, especially if depression/anxiety increasing any  2. Blurry vision, bilateral Will follow up with neurology  3. Chronic  nonintractable headache, unspecified headache type Will follow up with neurology  4. Vitamin D deficiency Start 1000IU supplement daily   General Counseling: Bryden verbalizes understanding of the findings of todays visit and agrees with plan of treatment. I have discussed any further diagnostic evaluation that may be needed or ordered today. We also reviewed her medications today. she has been encouraged to call the office with any questions or concerns that should arise related to todays visit.    No orders of the defined types were placed in this encounter.   No orders of the defined types were placed in this encounter.   This patient was seen by Lynn Ito, PA-C in collaboration with Dr. Beverely Risen as a part of collaborative care agreement.   Total time spent:30 Minutes Time spent includes review of chart, medications, test results, and follow up plan with the patient.      Dr Lyndon Code Internal medicine

## 2023-10-06 ENCOUNTER — Telehealth: Payer: Self-pay

## 2023-10-06 NOTE — Telephone Encounter (Signed)
Pt's mom (on DPR) called triage for pt and said pt needs to be seen. She has had a lot of nausea the last 5 days. Not eating or drinking good. Has a lot of abdominal pain and pelvic cramping. I advised mom she needs to take pt to ER for further evaluation. She will let daughter know and follow up with [place] if needed.

## 2023-10-08 ENCOUNTER — Encounter: Payer: Self-pay | Admitting: Nurse Practitioner

## 2023-10-08 ENCOUNTER — Ambulatory Visit: Payer: Medicaid Other | Admitting: Nurse Practitioner

## 2023-10-08 VITALS — BP 130/84 | HR 109 | Temp 98.6°F | Resp 16 | Ht 65.0 in | Wt 208.2 lb

## 2023-10-08 DIAGNOSIS — B379 Candidiasis, unspecified: Secondary | ICD-10-CM | POA: Diagnosis not present

## 2023-10-08 DIAGNOSIS — R319 Hematuria, unspecified: Secondary | ICD-10-CM | POA: Diagnosis not present

## 2023-10-08 DIAGNOSIS — N10 Acute tubulo-interstitial nephritis: Secondary | ICD-10-CM | POA: Diagnosis not present

## 2023-10-08 DIAGNOSIS — R110 Nausea: Secondary | ICD-10-CM

## 2023-10-08 DIAGNOSIS — R5383 Other fatigue: Secondary | ICD-10-CM

## 2023-10-08 DIAGNOSIS — N390 Urinary tract infection, site not specified: Secondary | ICD-10-CM | POA: Diagnosis not present

## 2023-10-08 DIAGNOSIS — R300 Dysuria: Secondary | ICD-10-CM

## 2023-10-08 LAB — POCT URINALYSIS DIPSTICK
Bilirubin, UA: NEGATIVE
Glucose, UA: NEGATIVE
Leukocytes, UA: NEGATIVE
Nitrite, UA: NEGATIVE
Protein, UA: NEGATIVE
Spec Grav, UA: 1.015 (ref 1.010–1.025)
Urobilinogen, UA: 0.2 U/dL
pH, UA: 5 (ref 5.0–8.0)

## 2023-10-08 LAB — POCT URINE PREGNANCY: Preg Test, Ur: NEGATIVE

## 2023-10-08 MED ORDER — CIPROFLOXACIN HCL 500 MG PO TABS
500.0000 mg | ORAL_TABLET | Freq: Two times a day (BID) | ORAL | 0 refills | Status: AC
Start: 2023-10-08 — End: 2023-10-18

## 2023-10-08 MED ORDER — FLUCONAZOLE 150 MG PO TABS
150.0000 mg | ORAL_TABLET | Freq: Once | ORAL | 0 refills | Status: AC
Start: 2023-10-08 — End: 2023-10-08

## 2023-10-08 MED ORDER — ONDANSETRON 4 MG PO TBDP
4.0000 mg | ORAL_TABLET | Freq: Three times a day (TID) | ORAL | 4 refills | Status: DC | PRN
Start: 2023-10-08 — End: 2024-02-16

## 2023-10-08 NOTE — Progress Notes (Signed)
[facility] [address] [place], [place] [address]  Internal MEDICINE  Office Visit Note  Patient Name: Kathryn Estrada  [id number]  [id number]  Date of Service: 10/08/2023  Chief Complaint  Patient presents with   Acute Visit    Nauseous, back pain. Very bad abdominal pain.      HPI Kennetra presents for an acute sick visit for nausea and back pain At 9 months preg -- had shooting epigastric pains, was told was GERD Then saw GI doc and said it was probably the gallbladder Positive CVA tenderness, nausea Positive urinalysis in office Need repeat labs    Current Medication:  Outpatient Encounter Medications as of 10/08/2023  Medication Sig   [EXPIRED] ciprofloxacin (CIPRO) 500 MG tablet Take 1 tablet (500 mg total) by mouth 2 (two) times daily for 10 days.   [EXPIRED] fluconazole (DIFLUCAN) 150 MG tablet Take 1 tablet (150 mg total) by mouth once for 1 dose. May take an additional dose after 3 days if still symptomatic.   ondansetron (ZOFRAN-ODT) 4 MG disintegrating tablet Take 1 tablet (4 mg total) by mouth every 8 (eight) hours as needed for nausea or vomiting.   [DISCONTINUED] meloxicam (MOBIC) 15 MG tablet Take 15 mg by mouth daily.   [DISCONTINUED] Prenatal Vit-Fe Fumarate-FA (MULTIVITAMIN-PRENATAL) 27-0.8 MG TABS tablet Take 1 tablet by mouth daily at 12 noon. (Patient not taking: Reported on 11/06/2023)   No facility-administered encounter medications on file as of 10/08/2023.      Medical History: Past Medical History:  Diagnosis Date   Anemia    Complication of anesthesia    takes alot of anesthetic for dental procedure to become numb   Elevated blood pressure reading    Family history of adverse reaction to anesthesia    takes alot of anesthetic at dentist to become numb   GERD (gastroesophageal reflux disease)    Raynaud's syndrome without gangrene      Vital Signs: BP 130/84   Pulse (!) 109   Temp 98.6 F (37 C)   Resp 16    Ht 5\' 5"  (1.651 m)   Wt 208 lb 3.2 oz (94.4 kg)   SpO2 99%   BMI 34.65 kg/m    Review of Systems  Constitutional:  Positive for activity change, appetite change and fatigue.  Respiratory: Negative.  Negative for cough, chest tightness, shortness of breath and wheezing.   Cardiovascular: Negative.  Negative for chest pain and palpitations.  Gastrointestinal:  Positive for abdominal pain and nausea. Negative for abdominal distention, constipation and diarrhea.  Genitourinary:  Positive for dysuria, flank pain, frequency, pelvic pain and urgency.  Musculoskeletal:  Positive for back pain.    Physical Exam Vitals reviewed.  Constitutional:      General: She is not in acute distress.    Appearance: Normal appearance. She is ill-appearing.  HENT:     Head: Normocephalic and atraumatic.  Eyes:     Pupils: Pupils are equal, round, and reactive to light.  Cardiovascular:     Rate and Rhythm: Normal rate and regular rhythm.  Pulmonary:     Effort: Pulmonary effort is normal. No respiratory distress.  Abdominal:     General: Bowel sounds are normal.     Palpations: Abdomen is soft.     Tenderness: There is abdominal tenderness in the suprapubic area. There is right CVA tenderness, left CVA tenderness and guarding.  Skin:    General: Skin is warm and dry.  Neurological:     Mental  Status: She is alert and oriented to person, place, and time.  Psychiatric:        Mood and Affect: Mood normal.        Behavior: Behavior normal.       Assessment/Plan: 1. Pyelonephritis, acute Cipro prescribed, take until gone, urine culture sent and additional labs ordered . - CULTURE, URINE COMPREHENSIVE - CBC with Differential/Platelet - CMP14+EGFR - ciprofloxacin (CIPRO) 500 MG tablet; Take 1 tablet (500 mg total) by mouth 2 (two) times daily for 10 days.  Dispense: 20 tablet; Refill: 0  2. Nausea without vomiting Zofran prescribed, take as needed  - ondansetron (ZOFRAN-ODT) 4 MG  disintegrating tablet; Take 1 tablet (4 mg total) by mouth every 8 (eight) hours as needed for nausea or vomiting.  Dispense: 20 tablet; Refill: 4  3. Other fatigue Negative urine pregnancy test. Additional labs ordered  - POCT urine pregnancy - CBC with Differential/Platelet - CMP14+EGFR  4. Antibiotic-induced yeast infection Fluconazole prescribed just in case  - fluconazole (DIFLUCAN) 150 MG tablet; Take 1 tablet (150 mg total) by mouth once for 1 dose. May take an additional dose after 3 days if still symptomatic.  Dispense: 3 tablet; Refill: 0  5. Dysuria Urinalysis positive, urine culture sent  - POCT urinalysis dipstick - CULTURE, URINE COMPREHENSIVE   General Counseling: Neysa verbalizes understanding of the findings of todays visit and agrees with plan of treatment. I have discussed any further diagnostic evaluation that may be needed or ordered today. We also reviewed her medications today. she has been encouraged to call the office with any questions or concerns that should arise related to todays visit.    Counseling:    Orders Placed This Encounter  Procedures   CULTURE, URINE COMPREHENSIVE   CBC with Differential/Platelet   CMP14+EGFR   POCT urinalysis dipstick   POCT urine pregnancy    Meds ordered this encounter  Medications   ondansetron (ZOFRAN-ODT) 4 MG disintegrating tablet    Sig: Take 1 tablet (4 mg total) by mouth every 8 (eight) hours as needed for nausea or vomiting.    Dispense:  20 tablet    Refill:  4   ciprofloxacin (CIPRO) 500 MG tablet    Sig: Take 1 tablet (500 mg total) by mouth 2 (two) times daily for 10 days.    Dispense:  20 tablet    Refill:  0    Fill now today please   fluconazole (DIFLUCAN) 150 MG tablet    Sig: Take 1 tablet (150 mg total) by mouth once for 1 dose. May take an additional dose after 3 days if still symptomatic.    Dispense:  3 tablet    Refill:  0    Return if symptoms worsen or fail to improve.  [place]  Controlled Substance Database was reviewed by me for overdose risk score (ORS)  Time spent:30 Minutes Time spent with patient included reviewing progress notes, labs, imaging studies, and discussing plan for follow up.   This patient was seen by Sallyanne Kuster, FNP-C in collaboration with Dr. Beverely Risen as a part of collaborative care agreement.  Donie Moulton R. Tedd Sias, MSN, FNP-C Internal Medicine

## 2023-10-09 ENCOUNTER — Encounter: Payer: Self-pay | Admitting: Advanced Practice Midwife

## 2023-10-09 ENCOUNTER — Ambulatory Visit (INDEPENDENT_AMBULATORY_CARE_PROVIDER_SITE_OTHER): Payer: Medicaid Other | Admitting: Advanced Practice Midwife

## 2023-10-09 VITALS — BP 104/68 | HR 89 | Resp 16 | Ht 65.0 in | Wt 207.9 lb

## 2023-10-09 DIAGNOSIS — R102 Pelvic and perineal pain: Secondary | ICD-10-CM

## 2023-10-09 DIAGNOSIS — N946 Dysmenorrhea, unspecified: Secondary | ICD-10-CM

## 2023-10-09 MED ORDER — ORILISSA 150 MG PO TABS: 1.0000 | ORAL_TABLET | Freq: Every day | ORAL | 5 refills | Status: DC

## 2023-10-09 NOTE — Patient Instructions (Addendum)
Preventive Care 3-27 Years Old, Female Preventive care refers to lifestyle choices and visits with your health care provider that can promote health and wellness. Preventive care visits are also called wellness exams. What can I expect for my preventive care visit? Counseling During your preventive care visit, your health care provider may ask about your: Medical history, including: Past medical problems. Family medical history. Pregnancy history. Current health, including: Menstrual cycle. Method of birth control. Emotional well-being. Home life and relationship well-being. Sexual activity and sexual health. Lifestyle, including: Alcohol, nicotine or tobacco, and drug use. Access to firearms. Diet, exercise, and sleep habits. Work and work Astronomer. Sunscreen use. Safety issues such as seatbelt and bike helmet use. Physical exam Your health care provider may check your: Height and weight. These may be used to calculate your BMI (body mass index). BMI is a measurement that tells if you are at a healthy weight. Waist circumference. This measures the distance around your waistline. This measurement also tells if you are at a healthy weight and may help predict your risk of certain diseases, such as type 2 diabetes and high blood pressure. Heart rate and blood pressure. Body temperature. Skin for abnormal spots. What immunizations do I need?  Vaccines are usually given at various ages, according to a schedule. Your health care provider will recommend vaccines for you based on your age, medical history, and lifestyle or other factors, such as travel or where you work. What tests do I need? Screening Your health care provider may recommend screening tests for certain conditions. This may include: Pelvic exam and Pap test. Lipid and cholesterol levels. Diabetes screening. This is done by checking your blood sugar (glucose) after you have not eaten for a while (fasting). Hepatitis  B test. Hepatitis C test. HIV (human immunodeficiency virus) test. STI (sexually transmitted infection) testing, if you are at risk. BRCA-related cancer screening. This may be done if you have a family history of breast, ovarian, tubal, or peritoneal cancers. Talk with your health care provider about your test results, treatment options, and if necessary, the need for more tests. Follow these instructions at home: Eating and drinking  Eat a healthy diet that includes fresh fruits and vegetables, whole grains, lean protein, and low-fat dairy products. Take vitamin and mineral supplements as recommended by your health care provider. Do not drink alcohol if: Your health care provider tells you not to drink. You are pregnant, may be pregnant, or are planning to become pregnant. If you drink alcohol: Limit how much you have to 0-1 drink a day. Know how much alcohol is in your drink. In the [place] one drink equals one 12 oz bottle of beer (355 mL), one 5 oz glass of wine (148 mL), or one 1 oz glass of hard liquor (44 mL). Lifestyle Brush your teeth every morning and night with fluoride toothpaste. Floss one time each day. Exercise for at least 30 minutes 5 or more days each week. Do not use any products that contain nicotine or tobacco. These products include cigarettes, chewing tobacco, and vaping devices, such as e-cigarettes. If you need help quitting, ask your health care provider. Do not use drugs. If you are sexually active, practice safe sex. Use a condom or other form of protection to prevent STIs. If you do not wish to become pregnant, use a form of birth control. If you plan to become pregnant, see your health care provider for a prepregnancy visit. Find healthy ways to manage stress, such as: Meditation,  yoga, or listening to music. Journaling. Talking to a trusted person. Spending time with friends and family. Minimize exposure to UV radiation to reduce your risk of skin  cancer. Safety Always wear your seat belt while driving or riding in a vehicle. Do not drive: If you have been drinking alcohol. Do not ride with someone who has been drinking. If you have been using any mind-altering substances or drugs. While texting. When you are tired or distracted. Wear a helmet and other protective equipment during sports activities. If you have firearms in your house, make sure you follow all gun safety procedures. Seek help if you have been physically or sexually abused. What's next? Go to your health care provider once a year for an annual wellness visit. Ask your health care provider how often you should have your eyes and teeth checked. Stay up to date on all vaccines. This information is not intended to replace advice given to you by your health care provider. Make sure you discuss any questions you have with your health care provider. Document Revised: 06/06/2021 Document Reviewed: 06/06/2021 Elsevier Patient Education  2024 [employer] Inc. Breast Self-Awareness Breast self-awareness is knowing how your breasts look and feel. You need to: Check your breasts on a regular basis. Tell your doctor about any changes. Become familiar with the look and feel of your breasts. This can help you catch a breast problem while it is still small and can be treated. You should do breast self-exams even if you have breast implants. What you need: A mirror. A well-lit room. A pillow or other soft object. How to do a breast self-exam Follow these steps to do a breast self-exam: Look for changes  Take off all the clothes above your waist. Stand in front of a mirror in a room with good lighting. Put your hands down at your sides. Compare your breasts in the mirror. Look for any difference between them, such as: A difference in shape. A difference in size. Wrinkles, dips, and bumps in one breast and not the other. Look at each breast for changes in the skin, such  as: Redness. Scaly areas. Skin that has gotten thicker. Dimpling. Open sores (ulcers). Look for changes in your nipples, such as: Fluid coming out of a nipple. Fluid around a nipple. Bleeding. Dimpling. Redness. A nipple that looks pushed in (retracted), or that has changed position. Feel for changes Lie on your back. Feel each breast. To do this: Pick a breast to feel. Place a pillow under the shoulder closest to that breast. Put the arm closest to that breast behind your head. Feel the nipple area of that breast using the hand of your other arm. Feel the area with the pads of your three middle fingers by making small circles with your fingers. Use light, medium, and firm pressure. Continue the overlapping circles, moving downward over the breast. Keep making circles with your fingers. Stop when you feel your ribs. Start making circles with your fingers again, this time going upward until you reach your collarbone. Then, make circles outward across your breast and into your armpit area. Squeeze your nipple. Check for discharge and lumps. Repeat these steps to check your other breast. Sit or stand in the tub or shower. With soapy water on your skin, feel each breast the same way you did when you were lying down. Write down what you find Writing down what you find can help you remember what to tell your doctor. Write down: What is  normal for each breast. Any changes you find in each breast. These include: The kind of changes you find. A tender or painful breast. Any lump you find. Write down its size and where it is. When you last had your monthly period (menstrual cycle). General tips If you are breastfeeding, the best time to check your breasts is after you feed your baby or after you use a breast pump. If you get monthly bleeding, the best time to check your breasts is 5-7 days after your monthly cycle ends. With time, you will become comfortable with the self-exam. You will  also start to know if there are changes in your breasts. Contact a doctor if: You see a change in the shape or size of your breasts or nipples. You see a change in the skin of your breast or nipples, such as red or scaly skin. You have fluid coming from your nipples that is not normal. You find a new lump or thick area. You have breast pain. You have any concerns about your breast health. Summary Breast self-awareness includes looking for changes in your breasts and feeling for changes within your breasts. You should do breast self-awareness in front of a mirror in a well-lit room. If you get monthly periods (menstrual cycles), the best time to check your breasts is 5-7 days after your period ends. Tell your doctor about any changes you see in your breasts. Changes include changes in size, changes on the skin, painful or tender breasts, or fluid from your nipples that is not normal. This information is not intended to replace advice given to you by your health care provider. Make sure you discuss any questions you have with your health care provider. Document Revised: 05/16/2022 Document Reviewed: 10/11/2021 Elsevier Patient Education  2024 [employer].  Endometriosis  Endometriosis is a condition in which tissue that forms the lining of the uterus grows in places outside the uterus. This tissue can grow in the organs that create the eggs (ovaries), in the tubes that carry the eggs to the uterus (fallopian tubes), in the vagina, and in the bowel. This tissue most often grows on the ovaries and inner lining of the pelvic cavity (peritoneum). What are the causes? The cause of this condition is not known. What increases the risk? The following factors may make you more likely to develop this condition: Having a family history of endometriosis. Having never given birth. Starting your menstrual period at age 38 or younger. What are the signs or symptoms? Often, there are no symptoms of this  condition. If you do have symptoms, they may include: Heavier bleeding during menstrual periods. Menstrual periods that happen more than once a month. Not being able to get pregnant. Pain in the area between your hip bones (pelvis). Pain during sex. Pain in the back or abdomen. Painful bowel movements and urination during menstrual periods. Rarely, you may see blood in your stool or urine. The timing of symptoms may vary, depending on where the abnormal tissue is growing. They may happen during your menstrual period (most often) or at the middle of your cycle. They may come and go. You may have no symptoms during some months. They may stop when you no longer have your monthly periods (menopause). How is this diagnosed? This condition is diagnosed based on your symptoms and a physical exam. You may also have tests, such as: Blood tests and urine tests to help rule out other causes. Ultrasound to look for tissues that are  not normal. This is often done over your skin (transabdominal). It is sometimes done through the vagina (transvaginal). X-ray of the lower bowel (barium enema). CT scan. MRI. To confirm the diagnosis, your health care provider may use a device with a small camera to check tissue inside your abdomen (laparoscopy). Abnormal tissue may be removed and checked in a lab (biopsy). How is this treated? There is no cure for this condition. The treatment goal is to control your symptoms. The type of treatment also depends on whether you want to become pregnant in the future. This condition may be treated with: Medicines. These may include: Medicines to relieve pain, including NSAIDs, such as ibuprofen. Hormone therapy, such as birth control pills, to slow the growth of abnormal tissue. Surgery to remove the abnormal tissue. During surgery, the following may happen: Tissue may be removed using a laparoscope and a laser (laparoscopic laser treatment). The ovaries, fallopian tubes, and  uterus may be removed (hysterectomy). This is done in very severe cases. Follow these instructions at home: Medicines Take over-the-counter and prescription medicines only as told by your health care provider. Ask your health care provider if the medicine prescribed to you: Requires you to avoid driving or using machinery. Can cause constipation. You may need to take these actions to prevent or treat constipation: Drink enough fluid to keep your urine pale yellow. Take over-the-counter or prescription medicines. Eat foods that are high in fiber, such as beans, whole grains, and fresh fruits and vegetables. Limit foods that are high in fat and processed sugars, such as fried or sweet foods. Eating and drinking If you drink alcohol: Limit how much you have to 0-1 drink a day for women who are not pregnant. Know how much alcohol is in your drink. In the [place] one drink equals one 12 oz bottle of beer (355 mL), one 5 oz glass of wine (148 mL), or one 1 oz glass of hard liquor (44 mL). Avoid caffeine. Activity Return to your normal activities as told by your health care provider. Ask your health care provider what activities are safe for you. Do exercises as told by your health care provider. General instructions Do not use any products that contain nicotine or tobacco. These products include cigarettes, chewing tobacco, and vaping devices, such as e-cigarettes. If you need help quitting, ask your health care provider. Keep all follow-up visits. This is important. Where to find more information [employer]: www.acog.org Office on [employer] [facility]: http://hoffman.com/ Contact a health care provider if: You have new pain or trouble controlling pain. You have problems getting pregnant. You have a fever. Get help right away if: You have severe pain that does not get better with medicine. You have severe nausea and vomiting, or you cannot eat or drink  without vomiting. You have pain in your abdomen only on the lower right side. Pain in your abdomen gets worse. You have swelling in your abdomen. You have blood in your stool. Summary Endometriosis is a condition that happens when tissue that forms the lining of the uterus grows in places outside the uterus. The cause of this condition is not known. This condition may be treated with medicines to relieve pain, hormone therapy, or surgery. If you have this condition, get regular exercise, limit alcohol use, and avoid caffeine. Get help right away if you have severe pain that does not get better with medicine, severe nausea and vomiting, pain or swelling in your abdomen, or blood in  your stool. This information is not intended to replace advice given to you by your health care provider. Make sure you discuss any questions you have with your health care provider. Document Revised: 07/11/2020 Document Reviewed: 07/12/2020 Elsevier Patient Education  2024 [employer].

## 2023-10-09 NOTE — Progress Notes (Signed)
Patient ID: Kathryn Estrada, female   DOB: 01/14/1996, 27 y.o.   MRN: [id number]  Reason for Visit: severe dysmenorrhea   Subjective:  HPI:  Kathryn Estrada is a 27 y.o. female being seen today for severe general abdominal pain. She wanted to be seen by GI and was able to get in sooner here. She is now also scheduled with GI. She has abdominal- especially epigastric pain since her pregnancy. She has developed nausea and vomiting in the past 2 weeks. The pain is in her lower back and "all over her stomach". She was seen yesterday and diagnosed with a UTI based on blood in her urine- culture is pending. She reports regular bowel movements and denies urinary symptoms. All to be addressed by GI at upcoming appointment.  She also mentions pain with intercourse since her recent delivery. She does not think the issue is dryness. She then mentions that she has severe pain with her menstrual cycle since the age of 40. The first 2 days she takes 3 Aleve every 3 hours just to "be able to function". Heating pad also helps some. Menses onset age 89. Her periods are every 28 days lasting 5 days and of moderate flow. We discussed the possibility of endometriosis. She declines hormonal treatment. She declines imaging and pelvic exam. She is interested in a trial of Orilissa. Information given and Rx sent.   Past Medical History:  Diagnosis Date   Anemia    Complication of anesthesia    takes alot of anesthetic for dental procedure to become numb   Elevated blood pressure reading    Family history of adverse reaction to anesthesia    takes alot of anesthetic at dentist to become numb   GERD (gastroesophageal reflux disease)    Raynaud's syndrome without gangrene    Family History  Problem Relation Age of Onset   Neurofibromatosis Mother    Anemia Mother    Neurofibromatosis Sister    Neurofibromatosis Brother    Neurofibromatosis Brother    Schizophrenia Maternal Grandfather     Kidney disease Paternal Grandmother    Cancer Paternal Grandfather 38       colon   Past Surgical History:  Procedure Laterality Date   CESAREAN SECTION N/A 04/03/2021   Procedure: CESAREAN SECTION;  Surgeon: Natale Milch, MD;  Location: [facility] ORS;  Service: Obstetrics;  Laterality: N/A;   CESAREAN SECTION N/A 04/24/2023   Procedure: REPEAT CESAREAN SECTION;  Surgeon: Hildred Laser, MD;  Location: [facility] ORS;  Service: Obstetrics;  Laterality: N/A;   WISDOM TOOTH EXTRACTION     four;- age 33, 36, 78, 55    Short Social History:  Social History   Tobacco Use   Smoking status: Every Day    Types: E-cigarettes   Smokeless tobacco: Never   Tobacco comments:    Quit cigarettes 08-28-2020, now vapes   Substance Use Topics   Alcohol use: Not Currently    Comment: ocassinally/ once a month     No Known Allergies  Current Outpatient Medications  Medication Sig Dispense Refill   ciprofloxacin (CIPRO) 500 MG tablet Take 1 tablet (500 mg total) by mouth 2 (two) times daily for 10 days. 20 tablet 0   Elagolix Sodium (ORILISSA) 150 MG TABS Take 1 tablet (150 mg total) by mouth daily. 30 tablet 5   fluconazole (DIFLUCAN) 150 MG tablet Take 150 mg by mouth once.     ondansetron (ZOFRAN-ODT) 4 MG disintegrating tablet Take 1 tablet (4 mg  total) by mouth every 8 (eight) hours as needed for nausea or vomiting. 20 tablet 4   Prenatal Vit-Fe Fumarate-FA (MULTIVITAMIN-PRENATAL) 27-0.8 MG TABS tablet Take 1 tablet by mouth daily at 12 noon.     No current facility-administered medications for this visit.    Review of Systems  Constitutional:  Negative for chills and fever.  HENT:  Negative for congestion, ear discharge, ear pain, hearing loss, sinus pain and sore throat.   Eyes:  Negative for blurred vision and double vision.  Respiratory:  Negative for cough, shortness of breath and wheezing.   Cardiovascular:  Negative for chest pain, palpitations and leg swelling.  Gastrointestinal:   Positive for abdominal pain, nausea and vomiting. Negative for blood in stool, constipation, diarrhea, heartburn and melena.  Genitourinary:  Negative for dysuria, flank pain, frequency, hematuria and urgency.  Musculoskeletal:  Negative for back pain, joint pain and myalgias.  Skin:  Negative for itching and rash.  Neurological:  Negative for dizziness, tingling, tremors, sensory change, speech change, focal weakness, seizures, loss of consciousness, weakness and headaches.  Endo/Heme/Allergies:  Negative for environmental allergies. Does not bruise/bleed easily.       Positive for severe cramping with periods  Psychiatric/Behavioral:  Negative for depression, hallucinations, memory loss, substance abuse and suicidal ideas. The patient is not nervous/anxious and does not have insomnia.       Objective:  Objective   Vitals:   10/09/23 1350  BP: 104/68  Pulse: 89  Resp: 16  Weight: 207 lb 14.4 oz (94.3 kg)  Height: 5\' 5"  (1.651 m)   Body mass index is 34.6 kg/m. Constitutional: Well nourished, well developed female in no acute distress.  HEENT: normal Skin: Warm and dry.  Cardiovascular: Regular rate and rhythm.   Respiratory:  Normal respiratory effort Abdomen: soft, nondistended, mild mid epigastric pain Psych: Alert and Oriented x3. No memory deficits. Normal mood and affect.    Assessment/Plan:     27 y.o. G2 P2002 female with severe dysmenorrhea, possible endometriosis  Rx Orilissa Follow up as needed Return for annual in 1 year Next PAP due in 2026 per 5 year interval (patient preference)   Tresea Mall, CNM [facility] 10/09/2023 5:55 PM

## 2023-10-13 LAB — CULTURE, URINE COMPREHENSIVE

## 2023-10-24 ENCOUNTER — Emergency Department: Payer: Medicaid Other

## 2023-10-24 ENCOUNTER — Emergency Department
Admission: EM | Admit: 2023-10-24 | Discharge: 2023-10-24 | Disposition: A | Discharge status: Home / Self Care | Payer: Medicaid Other | Attending: Emergency Medicine | Admitting: Emergency Medicine

## 2023-10-24 ENCOUNTER — Other Ambulatory Visit: Payer: Self-pay

## 2023-10-24 DIAGNOSIS — R109 Unspecified abdominal pain: Secondary | ICD-10-CM

## 2023-10-24 DIAGNOSIS — N202 Calculus of kidney with calculus of ureter: Secondary | ICD-10-CM | POA: Diagnosis not present

## 2023-10-24 DIAGNOSIS — N200 Calculus of kidney: Secondary | ICD-10-CM

## 2023-10-24 LAB — URINALYSIS, ROUTINE W REFLEX MICROSCOPIC
Bacteria, UA: NONE SEEN
Bilirubin Urine: NEGATIVE
Glucose, UA: NEGATIVE mg/dL
Ketones, ur: NEGATIVE mg/dL
Leukocytes,Ua: NEGATIVE
Nitrite: NEGATIVE
Protein, ur: 100 mg/dL — AB
RBC / HPF: >50 RBC/hpf (ref 0–5)
Specific Gravity, Urine: 1.026 (ref 1.005–1.030)
Squamous Epithelial / HPF: >50 /[HPF] (ref 0–5)
pH: 6 (ref 5.0–8.0)

## 2023-10-24 LAB — CBC
HCT: 45 % (ref 36.0–46.0)
Hemoglobin: 14.3 g/dL (ref 12.0–15.0)
MCH: 26.7 pg (ref 26.0–34.0)
MCHC: 31.8 g/dL (ref 30.0–36.0)
MCV: 84.1 fL (ref 80.0–100.0)
Platelets: 279 10*3/uL (ref 150–400)
RBC: 5.35 MIL/uL — ABNORMAL HIGH (ref 3.87–5.11)
RDW: 13.4 % (ref 11.5–15.5)
WBC: 7 10*3/uL (ref 4.0–10.5)
nRBC: 0 % (ref 0.0–0.2)

## 2023-10-24 LAB — COMPREHENSIVE METABOLIC PANEL
ALT: 18 U/L (ref 0–44)
AST: 21 U/L (ref 15–41)
Albumin: 4 g/dL (ref 3.5–5.0)
Alkaline Phosphatase: 77 U/L (ref 38–126)
Anion gap: 9 (ref 5–15)
BUN: 13 mg/dL (ref 6–20)
CO2: 23 mmol/L (ref 22–32)
Calcium: 8.9 mg/dL (ref 8.9–10.3)
Chloride: 102 mmol/L (ref 98–111)
Creatinine, Ser: 0.84 mg/dL (ref 0.44–1.00)
GFR, Estimated: >60 mL/min (ref >60)
Glucose, Bld: 87 mg/dL (ref 70–99)
Potassium: 3.8 mmol/L (ref 3.5–5.1)
Sodium: 134 mmol/L — ABNORMAL LOW (ref 135–145)
Total Bilirubin: 0.4 mg/dL (ref 0.3–1.2)
Total Protein: 7.4 g/dL (ref 6.5–8.1)

## 2023-10-24 LAB — LIPASE, BLOOD: Lipase: 31 U/L (ref 11–51)

## 2023-10-24 LAB — POC URINE PREG, ED: Preg Test, Ur: NEGATIVE

## 2023-10-24 MED ORDER — CEPHALEXIN 500 MG PO CAPS
500.0000 mg | ORAL_CAPSULE | Freq: Once | ORAL | Status: AC
  Administered 2023-10-24: 500 mg via ORAL
  Filled 2023-10-24: qty 1

## 2023-10-24 MED ORDER — CEPHALEXIN 500 MG PO CAPS: 500.0000 mg | ORAL_CAPSULE | Freq: Three times a day (TID) | ORAL | 0 refills | Status: AC

## 2023-10-24 NOTE — ED Triage Notes (Signed)
Pt here with a kidney infx. Pt was on Cipro for 10 days, finished on Mon and the pain returned after 24 hrs. Pt now has blood in her urine and severe pain. Pt ambulatory to triage.

## 2023-10-24 NOTE — ED Notes (Signed)
Pt to CT

## 2023-10-24 NOTE — Discharge Instructions (Addendum)
Take the prescription antibiotic as directed.  Follow-up with your primary provider for urine culture results to confirm appropriate treatment.  Increase your fluid intake and your bladder on schedule.  Consider cutting back on the diet soft drinks and increasing your intake of electrolyte water.  Also consider use of daily MiraLAX to promote normal bowel movements.

## 2023-10-24 NOTE — ED Provider Notes (Signed)
[facility] Emergency Department Provider Note     Event Date/Time   First MD Initiated Contact with Patient 10/24/23 1703     (approximate)   History   Flank Pain   HPI  Kathryn Estrada is a 27 y.o. female with a history of Raynaud's syndrome, presents to the ED for evaluation of kidney infection.  Patient reports recurrent symptoms at that she finished a 10-day course of Cipro on Monday.  She is now reporting gross hematuria and flank pain.  She denies any fevers, chills, sweats, nausea, vomiting, or diarrhea.  Physical Exam   Triage Vital Signs: ED Triage Vitals  Encounter Vitals Group     BP 10/24/23 1430 104/72     Systolic BP Percentile --      Diastolic BP Percentile --      Pulse Rate 10/24/23 1430 97     Resp 10/24/23 1430 18     Temp 10/24/23 1430 98.1 F (36.7 C)     Temp Source 10/24/23 1430 Oral     SpO2 10/24/23 1430 100 %     Weight 10/24/23 1440 207 lb 14.3 oz (94.3 kg)     Height 10/24/23 1440 5\' 5"  (1.651 m)     Head Circumference --      Peak Flow --      Pain Score 10/24/23 1440 7     Pain Loc --      Pain Education --      Exclude from Growth Chart --     Most recent vital signs: Vitals:   10/24/23 1430  BP: 104/72  Pulse: 97  Resp: 18  Temp: 98.1 F (36.7 C)  SpO2: 100%    General Awake, no distress. NAD HEENT NCAT. PERRL. EOMI. No rhinorrhea. Mucous membranes are moist.  CV:  Good peripheral perfusion. RRR RESP:  Normal effort. CTA ABD:  No distention.  Nontender.  No CVA tenderness elicited. MSK:  Normal spinal alignment without midline tenderness, spasm, deformity, or step-off.  Patient with mild tenderness to palpation over the bilateral sacroiliac joints.  Normal range of motion of all extremities.   ED Results / Procedures / Treatments   Labs (all labs ordered are listed, but only abnormal results are displayed) Labs Reviewed  COMPREHENSIVE METABOLIC PANEL - Abnormal; Notable for the  following components:      Result Value   Sodium 134 (*)    All other components within normal limits  CBC - Abnormal; Notable for the following components:   RBC 5.35 (*)    All other components within normal limits  URINALYSIS, ROUTINE W REFLEX MICROSCOPIC - Abnormal; Notable for the following components:   Color, Urine YELLOW (*)    APPearance CLOUDY (*)    Hgb urine dipstick LARGE (*)    Protein, ur 100 (*)    All other components within normal limits  URINE CULTURE  LIPASE, BLOOD  POC URINE PREG, ED     EKG  RADIOLOGY  I personally viewed and evaluated these images as part of my medical decision making, as well as reviewing the written report by the radiologist.  ED Provider Interpretation: 5 mm left ureteral stone; No hydronephrosis  CT Renal Stone Study  Result Date: 10/24/2023 CLINICAL DATA:  Abdominal pain, flank pain, hematuria EXAM: CT ABDOMEN AND PELVIS WITHOUT CONTRAST TECHNIQUE: Multidetector CT imaging of the abdomen and pelvis was performed following the standard protocol without IV contrast. RADIATION DOSE REDUCTION: This exam was performed according  to the departmental dose-optimization program which includes automated exposure control, adjustment of the mA and/or kV according to patient size and/or use of iterative reconstruction technique. COMPARISON:  None Available. FINDINGS: Lower chest: No acute abnormality. Hepatobiliary: No focal hepatic abnormality. Gallbladder unremarkable. Pancreas: No focal abnormality or ductal dilatation. Spleen: No focal abnormality.  Normal size. Adrenals/Urinary Tract: 5 mm proximal left ureteral stone. No hydronephrosis. No stones or hydronephrosis on the right. No renal or adrenal mass. Urinary bladder unremarkable. Stomach/Bowel: Normal appendix. Stomach, large and small bowel grossly unremarkable. Vascular/Lymphatic: No evidence of aneurysm or adenopathy. Reproductive: Uterus and adnexa unremarkable.  No mass. Other: No free fluid  or free air. Musculoskeletal: No acute bony abnormality. IMPRESSION: 5 mm nonobstructing proximal left ureteral stone. Electronically Signed   By: Charlett Nose M.D.   On: 10/24/2023 19:34     PROCEDURES:  Critical Care performed: No  Procedures   MEDICATIONS ORDERED IN ED: Medications  cephALEXin (KEFLEX) capsule 500 mg (500 mg Oral Given 10/24/23 2013)     IMPRESSION / MDM / ASSESSMENT AND PLAN / ED COURSE  I reviewed the triage vital signs and the nursing notes.                              Differential diagnosis includes, but is not limited to, UTI, pyelonephritis, nephrolithiasis  Patient's presentation is most consistent with acute complicated illness / injury requiring diagnostic workup.  Patient's diagnosis is consistent with flank pain due to nephrolithiasis and sacroiliac pain likely due to MSK etiology.  With reassuring exam and workup at this time.  Urine culture is pending as she may have had a failed outpatient course of Cipro prior to arrival.  Patient is given an empiric dose of Keflex in the ED.  Patient will be discharged home with prescriptions for cephalexin.  Patient is to follow up with urology as discussed, as needed or otherwise directed. Patient is given ED precautions to return to the ED for any worsening or new symptoms.   FINAL CLINICAL IMPRESSION(S) / ED DIAGNOSES   Final diagnoses:  Flank pain  Nephrolithiasis     Rx / DC Orders   ED Discharge Orders          Ordered    cephALEXin (KEFLEX) 500 MG capsule  3 times daily        10/24/23 2016             Note:  This document was prepared using Dragon voice recognition software and may include unintentional dictation errors.    Lissa Hoard, PA-C 10/24/23 2117    Chesley Noon, MD 10/25/23 1110

## 2023-10-26 LAB — URINE CULTURE: Culture: <10000 — AB

## 2023-11-03 ENCOUNTER — Other Ambulatory Visit: Payer: Self-pay

## 2023-11-06 ENCOUNTER — Ambulatory Visit (INDEPENDENT_AMBULATORY_CARE_PROVIDER_SITE_OTHER): Payer: Medicaid Other | Admitting: Gastroenterology

## 2023-11-06 ENCOUNTER — Encounter: Payer: Self-pay | Admitting: Gastroenterology

## 2023-11-06 VITALS — BP 110/69 | HR 103 | Temp 98.5°F | Ht 65.0 in | Wt 212.4 lb

## 2023-11-06 DIAGNOSIS — K5909 Other constipation: Secondary | ICD-10-CM

## 2023-11-06 DIAGNOSIS — R1010 Upper abdominal pain, unspecified: Secondary | ICD-10-CM

## 2023-11-06 DIAGNOSIS — R110 Nausea: Secondary | ICD-10-CM

## 2023-11-06 DIAGNOSIS — R1013 Epigastric pain: Secondary | ICD-10-CM

## 2023-11-06 DIAGNOSIS — R194 Change in bowel habit: Secondary | ICD-10-CM | POA: Diagnosis not present

## 2023-11-06 NOTE — Progress Notes (Signed)
Kathryn Repress, MD [address]  [address]  [place], [place] [address]  Main: [phone]  Fax: [phone]    Gastroenterology Consultation  Referring Provider:     Carlean Jews, PA* Primary Care Physician:  Carlean Jews, PA-C Primary Gastroenterologist:  Dr. Arlyss Estrada Reason for Consultation: Epigastric pain        HPI:   Kathryn Estrada is a 27 y.o. female referred by Carlean Jews, PA-C  for consultation & management of epigastric pain.  Patient reports that since April, she has been experiencing acute episodes of sharp/stabbing epigastric pain radiating to right subcostal margin, right shoulder and right upper back associated with nausea.  She had 4-5 episodes to date.  Each episode usually lasts about 30 minutes and most recent 1 lasted for few hours.  She had heartburn from 36 weeks to 38 weeks and took over-the-counter Nexium which provided relief.  These acute episodes are different from heartburn.  Patient never experienced heartburn before her pregnancy.  She did not have similar episodes of epigastric pain prior to her pregnancy.  She recently delivered a healthy, full-term baby, underwent a C-section.  She has 2 kids.  She does acknowledge consumption of simple carbs, carbonated beverages, sugary drinks, fast foods.  She also notices lower abdominal cramps whenever she tries to hold her stool when there is bathroom access issue.  Generally, she denies having any constipation or diarrhea.  She denies any abdominal bloating.  She reports having hemorrhoidal symptoms since her first pregnancy and worse during pregnancy and notices occasional bright red blood per rectum.  She denies any rectal urgency.  Follow-up visit 11/06/2023 Kathryn Estrada is here for follow-up of upper abdominal discomfort associated with nausea as well as irregular bowel movements.  She underwent right upper quadrant ultrasound, did not reveal cholelithiasis.  She does report  constipation for which she takes MiraLAX every other day.  She admits to not eating healthy.  She has been gaining weight post pregnancy and since last visit.  She is accompanied by her mom and her baby today.  She went to ER earlier this month secondary to abdominal pain, underwent CT renal protocol which revealed left-sided nonobstructing kidney stone and she was advised to drink plenty of water.  Serum lipase, LFTs, CBC were normal.  She denies any abdominal bloating.  Patient does not smoke or drink alcohol  NSAIDs: None  Antiplts/Anticoagulants/Anti thrombotics: None  GI Procedures: None  Past Medical History:  Diagnosis Date   Anemia    Complication of anesthesia    takes alot of anesthetic for dental procedure to become numb   Elevated blood pressure reading    Family history of adverse reaction to anesthesia    takes alot of anesthetic at dentist to become numb   GERD (gastroesophageal reflux disease)    Raynaud's syndrome without gangrene     Past Surgical History:  Procedure Laterality Date   CESAREAN SECTION N/A 04/03/2021   Procedure: CESAREAN SECTION;  Surgeon: Natale Milch, MD;  Location: [facility] ORS;  Service: Obstetrics;  Laterality: N/A;   CESAREAN SECTION N/A 04/24/2023   Procedure: REPEAT CESAREAN SECTION;  Surgeon: Hildred Laser, MD;  Location: [facility] ORS;  Service: Obstetrics;  Laterality: N/A;   WISDOM TOOTH EXTRACTION     four;- age 89, 34, 74, 34     Current Outpatient Medications:    ondansetron (ZOFRAN-ODT) 4 MG disintegrating tablet, Take 1 tablet (4 mg total) by mouth every 8 (  eight) hours as needed for nausea or vomiting., Disp: 20 tablet, Rfl: 4   Family History  Problem Relation Age of Onset   Neurofibromatosis Mother    Anemia Mother    Neurofibromatosis Sister    Neurofibromatosis Brother    Neurofibromatosis Brother    Schizophrenia Maternal Grandfather    Kidney disease Paternal Grandmother    Cancer Paternal Grandfather 21        colon     Social History   Tobacco Use   Smoking status: Every Day    Types: E-cigarettes   Smokeless tobacco: Never   Tobacco comments:    Quit cigarettes 08-28-2020, now vapes   Vaping Use   Vaping status: Former   Quit date: 08/28/2020   Devices: vape   Substance Use Topics   Alcohol use: Not Currently    Comment: ocassinally/ once a month    Drug use: Never    Allergies as of 11/06/2023   (No Known Allergies)    Review of Systems:    All systems reviewed and negative except where noted in HPI.   Physical Exam:  BP 110/69 (BP Location: Left Arm, Patient Position: Sitting, Cuff Size: Normal)   Pulse (!) 103   Temp 98.5 F (36.9 C) (Oral)   Ht 5\' 5"  (1.651 m)   Wt 212 lb 6 oz (96.3 kg)   LMP 10/06/2023   BMI 35.34 kg/m  Patient's last menstrual period was 10/06/2023.  General:   Alert,  Well-developed, well-nourished, pleasant and cooperative in NAD Head:  Normocephalic and atraumatic. Eyes:  Sclera clear, no icterus.   Conjunctiva pink. Ears:  Normal auditory acuity. Nose:  No deformity, discharge, or lesions. Mouth:  No deformity or lesions,oropharynx pink & moist. Neck:  Supple; no masses or thyromegaly. Lungs:  Respirations even and unlabored.  Clear throughout to auscultation.   No wheezes, crackles, or rhonchi. No acute distress. Heart:  Regular rate and rhythm; no murmurs, clicks, rubs, or gallops. Abdomen:  Normal bowel sounds. Soft, nontender and non-distended without masses, hepatosplenomegaly or hernias noted.  No guarding or rebound tenderness.   Rectal: Not performed Msk:  Symmetrical without gross deformities. Good, equal movement & strength bilaterally. Pulses:  Normal pulses noted. Extremities:  No clubbing or edema.  No cyanosis. Neurologic:  Alert and oriented x3;  grossly normal neurologically. Skin:  Intact without significant lesions or rashes. No jaundice. Psych:  Alert and cooperative. Normal mood and affect.  Imaging Studies: No  abdominal imaging  Assessment and Plan:   Kathryn Estrada is a 27 y.o. pleasant Caucasian female with BMI 35.34 is seen in consultation for chronic symptoms of upper abdominal pain associated with nausea, symptoms not related to food, irregular bowel movements.  Right upper quadrant ultrasound did not reveal cholelithiasis Recommend H. pylori breath test Reiterated about healthy diet, cut back on simple carbs, avoid carbonated beverages, sugary drinks, fast foods, red meat Increase MiraLAX to daily from every other day   Follow up based on the above workup   Kathryn Repress, MD

## 2023-11-23 ENCOUNTER — Encounter: Payer: Self-pay | Admitting: Nurse Practitioner

## 2023-12-01 ENCOUNTER — Ambulatory Visit: Payer: Medicaid Other | Admitting: Gastroenterology

## 2023-12-04 LAB — H. PYLORI BREATH TEST: H pylori Breath Test: NEGATIVE

## 2023-12-09 ENCOUNTER — Telehealth: Payer: Self-pay | Admitting: Gastroenterology

## 2023-12-09 ENCOUNTER — Other Ambulatory Visit: Payer: Self-pay

## 2023-12-09 DIAGNOSIS — R1013 Epigastric pain: Secondary | ICD-10-CM

## 2023-12-09 DIAGNOSIS — R1010 Upper abdominal pain, unspecified: Secondary | ICD-10-CM

## 2023-12-09 NOTE — Telephone Encounter (Signed)
She is taking Miralax daily and having bowel movements daily but still having abdominal pain

## 2023-12-09 NOTE — Telephone Encounter (Signed)
Called patient and patient states she can come tomorrow for the EGD as long as she finds someone to watch her kids. She has to ride tomorrow at 10:30am according to [place]. Sent instructions to [employer]. Sent instructions to [employer]. Informed patient what time to arrive and where to check in at

## 2023-12-09 NOTE — Telephone Encounter (Signed)
Please advise if patient needs a EGD

## 2023-12-09 NOTE — Telephone Encounter (Signed)
The patient called in to schedule an EGD. She said that Dr. Allegra Lai wanted her to get one but first she had to do the breath test and that came back normal.

## 2023-12-10 ENCOUNTER — Other Ambulatory Visit: Payer: Self-pay

## 2023-12-10 ENCOUNTER — Ambulatory Visit
Admission: RE | Admit: 2023-12-10 | Discharge: 2023-12-10 | Disposition: A | Discharge status: Home / Self Care | Payer: Medicaid Other | Attending: Gastroenterology | Admitting: Gastroenterology

## 2023-12-10 ENCOUNTER — Ambulatory Visit: Payer: Medicaid Other | Admitting: Certified Registered"

## 2023-12-10 ENCOUNTER — Encounter: Payer: Self-pay | Admitting: Gastroenterology

## 2023-12-10 ENCOUNTER — Encounter
Admission: RE | Disposition: A | Discharge status: Home / Self Care | Payer: Self-pay | Source: Home / Self Care | Attending: Gastroenterology

## 2023-12-10 DIAGNOSIS — Z800 Family history of malignant neoplasm of digestive organs: Secondary | ICD-10-CM | POA: Insufficient documentation

## 2023-12-10 DIAGNOSIS — Z87891 Personal history of nicotine dependence: Secondary | ICD-10-CM | POA: Insufficient documentation

## 2023-12-10 DIAGNOSIS — K219 Gastro-esophageal reflux disease without esophagitis: Secondary | ICD-10-CM | POA: Insufficient documentation

## 2023-12-10 DIAGNOSIS — R1013 Epigastric pain: Secondary | ICD-10-CM | POA: Insufficient documentation

## 2023-12-10 DIAGNOSIS — R1010 Upper abdominal pain, unspecified: Secondary | ICD-10-CM

## 2023-12-10 DIAGNOSIS — K2950 Unspecified chronic gastritis without bleeding: Secondary | ICD-10-CM | POA: Diagnosis not present

## 2023-12-10 DIAGNOSIS — K2980 Duodenitis without bleeding: Secondary | ICD-10-CM | POA: Diagnosis not present

## 2023-12-10 HISTORY — PX: BIOPSY: SHX5522

## 2023-12-10 HISTORY — PX: ESOPHAGOGASTRODUODENOSCOPY (EGD) WITH PROPOFOL: SHX5813

## 2023-12-10 LAB — POCT PREGNANCY, URINE: Preg Test, Ur: NEGATIVE

## 2023-12-10 SURGERY — ESOPHAGOGASTRODUODENOSCOPY (EGD) WITH PROPOFOL
Anesthesia: General

## 2023-12-10 MED ORDER — OMEPRAZOLE 40 MG PO CPDR: 40.0000 mg | DELAYED_RELEASE_CAPSULE | Freq: Every day | ORAL | 0 refills | Status: DC

## 2023-12-10 MED ORDER — PROPOFOL 10 MG/ML IV BOLUS
INTRAVENOUS | Status: DC | PRN
  Administered 2023-12-10: 100 mg via INTRAVENOUS

## 2023-12-10 MED ORDER — LIDOCAINE HCL (CARDIAC) PF 100 MG/5ML IV SOSY
PREFILLED_SYRINGE | INTRAVENOUS | Status: DC | PRN
  Administered 2023-12-10: 100 mg via INTRAVENOUS

## 2023-12-10 MED ORDER — MIDAZOLAM HCL 2 MG/2ML IJ SOLN
INTRAMUSCULAR | Status: DC | PRN
  Administered 2023-12-10: 2 mg via INTRAVENOUS

## 2023-12-10 MED ORDER — PROPOFOL 500 MG/50ML IV EMUL
INTRAVENOUS | Status: DC | PRN
  Administered 2023-12-10: 150 ug/kg/min via INTRAVENOUS

## 2023-12-10 MED ORDER — SODIUM CHLORIDE 0.9 % IV SOLN: INTRAVENOUS | Status: DC

## 2023-12-10 MED ORDER — MIDAZOLAM HCL 2 MG/2ML IJ SOLN
INTRAMUSCULAR | Status: AC
  Filled 2023-12-10: qty 2

## 2023-12-10 MED ORDER — DEXMEDETOMIDINE HCL IN NACL 200 MCG/50ML IV SOLN
INTRAVENOUS | Status: DC | PRN
  Administered 2023-12-10: 12 ug via INTRAVENOUS

## 2023-12-10 NOTE — Anesthesia Preprocedure Evaluation (Signed)
Anesthesia Evaluation  Patient identified by MRN, date of birth, ID band Patient awake    Reviewed: Allergy & Precautions, NPO status , Patient's Chart, lab work & pertinent test results  Airway Mallampati: III  TM Distance: >3 FB Neck ROM: full    Dental  (+) Chipped   Pulmonary neg pulmonary ROS, Current Smoker   Pulmonary exam normal        Cardiovascular negative cardio ROS Normal cardiovascular exam     Neuro/Psych negative neurological ROS  negative psych ROS   GI/Hepatic Neg liver ROS,GERD  Medicated,,  Endo/Other  negative endocrine ROS    Renal/GU negative Renal ROS  negative genitourinary   Musculoskeletal   Abdominal   Peds  Hematology negative hematology ROS (+)   Anesthesia Other Findings Past Medical History: No date: Anemia No date: Complication of anesthesia     Comment:  takes alot of anesthetic for dental procedure to become               numb No date: Elevated blood pressure reading No date: Family history of adverse reaction to anesthesia     Comment:  takes alot of anesthetic at dentist to become numb No date: GERD (gastroesophageal reflux disease) No date: Raynaud's syndrome without gangrene  Past Surgical History: 04/03/2021: CESAREAN SECTION; N/A     Comment:  Procedure: CESAREAN SECTION;  Surgeon: Natale Milch, MD;  Location: [facility] ORS;  Service:               Obstetrics;  Laterality: N/A; 04/24/2023: CESAREAN SECTION; N/A     Comment:  Procedure: REPEAT CESAREAN SECTION;  Surgeon: Hildred Laser, MD;  Location: [facility] ORS;  Service: Obstetrics;                Laterality: N/A; No date: WISDOM TOOTH EXTRACTION     Comment:  four;- age 74, 89, 24, 49  BMI    Body Mass Index: 34.98 kg/m      Reproductive/Obstetrics negative OB ROS                             Anesthesia Physical Anesthesia Plan  ASA: 2  Anesthesia  Plan: General   Post-op Pain Management: Minimal or no pain anticipated   Induction: Intravenous  PONV Risk Score and Plan: 3 and Propofol infusion, TIVA and Ondansetron  Airway Management Planned: Nasal Cannula  Additional Equipment: None  Intra-op Plan:   Post-operative Plan:   Informed Consent: I have reviewed the patients History and Physical, chart, labs and discussed the procedure including the risks, benefits and alternatives for the proposed anesthesia with the patient or authorized representative who has indicated his/her understanding and acceptance.     Dental advisory given  Plan Discussed with: CRNA and Surgeon  Anesthesia Plan Comments: (Discussed risks of anesthesia with patient, including possibility of difficulty with spontaneous ventilation under anesthesia necessitating airway intervention, PONV, and rare risks such as cardiac or respiratory or neurological events, and allergic reactions. Discussed the role of CRNA in patient's perioperative care. Patient understands.)       Anesthesia Quick Evaluation

## 2023-12-10 NOTE — Anesthesia Postprocedure Evaluation (Signed)
Anesthesia Post Note  Patient: Kathryn Estrada  Procedure(s) Performed: ESOPHAGOGASTRODUODENOSCOPY (EGD) WITH PROPOFOL BIOPSY  Patient location during evaluation: Endoscopy Anesthesia Type: General Level of consciousness: awake and alert Pain management: pain level controlled Vital Signs Assessment: post-procedure vital signs reviewed and stable Respiratory status: spontaneous breathing, nonlabored ventilation, respiratory function stable and patient connected to nasal cannula oxygen Cardiovascular status: blood pressure returned to baseline and stable Postop Assessment: no apparent nausea or vomiting Anesthetic complications: no  No notable events documented.   Last Vitals:  Vitals:   12/10/23 1238 12/10/23 1248  BP: 104/60 102/75  Pulse:    Resp: 18 20  Temp:    SpO2:      Last Pain:  Vitals:   12/10/23 1248  TempSrc:   PainSc: 0-No pain                 Stephanie Coup

## 2023-12-10 NOTE — Transfer of Care (Signed)
Immediate Anesthesia Transfer of Care Note  Patient: Kathryn Estrada  Procedure(s) Performed: ESOPHAGOGASTRODUODENOSCOPY (EGD) WITH PROPOFOL BIOPSY  Patient Location: Endoscopy Unit  Anesthesia Type:General  Level of Consciousness: drowsy  Airway & Oxygen Therapy: Patient Spontanous Breathing  Post-op Assessment: Report given to RN  Post vital signs: stable  Last Vitals:  Vitals Value Taken Time  BP    Temp    Pulse    Resp    SpO2      Last Pain:  Vitals:   12/10/23 1150  TempSrc: Temporal  PainSc: 0-No pain         Complications: No notable events documented.

## 2023-12-10 NOTE — H&P (Signed)
Arlyss Repress, MD [address]  [address]  [place], [place] [address]  Main: [phone]  Fax: [phone] Pager: [phone]  Primary Care Physician:  Carlean Jews, PA-C Primary Gastroenterologist:  Dr. Arlyss Repress  Pre-Procedure History & Physical: HPI:  Kathryn Estrada is a 27 y.o. female is here for an endoscopy.   Past Medical History:  Diagnosis Date   Anemia    Complication of anesthesia    takes alot of anesthetic for dental procedure to become numb   Elevated blood pressure reading    Family history of adverse reaction to anesthesia    takes alot of anesthetic at dentist to become numb   GERD (gastroesophageal reflux disease)    Raynaud's syndrome without gangrene     Past Surgical History:  Procedure Laterality Date   CESAREAN SECTION N/A 04/03/2021   Procedure: CESAREAN SECTION;  Surgeon: Natale Milch, MD;  Location: [facility] ORS;  Service: Obstetrics;  Laterality: N/A;   CESAREAN SECTION N/A 04/24/2023   Procedure: REPEAT CESAREAN SECTION;  Surgeon: Hildred Laser, MD;  Location: [facility] ORS;  Service: Obstetrics;  Laterality: N/A;   WISDOM TOOTH EXTRACTION     four;- age 26, 62, 41, 23    Prior to Admission medications   Medication Sig Start Date End Date Taking? Authorizing Provider  ondansetron (ZOFRAN-ODT) 4 MG disintegrating tablet Take 1 tablet (4 mg total) by mouth every 8 (eight) hours as needed for nausea or vomiting. 10/08/23   Sallyanne Kuster, NP    Allergies as of 12/09/2023   (No Known Allergies)    Family History  Problem Relation Age of Onset   Neurofibromatosis Mother    Anemia Mother    Neurofibromatosis Sister    Neurofibromatosis Brother    Neurofibromatosis Brother    Schizophrenia Maternal Grandfather    Kidney disease Paternal Grandmother    Cancer Paternal Grandfather 57       colon    Social History   Socioeconomic History   Marital status: Married    Spouse name: Joselyn Glassman   Number of  children: 1   Years of education: 13.5   Highest education level: Not on file  Occupational History   Occupation: stay at home mom  Tobacco Use   Smoking status: Every Day    Types: E-cigarettes   Smokeless tobacco: Never   Tobacco comments:    Quit cigarettes 08-28-2020, now vapes   Vaping Use   Vaping status: Former   Quit date: 08/28/2020   Devices: vape   Substance and Sexual Activity   Alcohol use: Not Currently    Comment: ocassinally/ once a month    Drug use: Never   Sexual activity: Yes    Partners: Male    Birth control/protection: None    Comment: undecided  Other Topics Concern   Not on file  Social History Narrative   Not on file   Social Drivers of Health   Financial Resource Strain: Low Risk  (09/18/2022)   Overall Financial Resource Strain (CARDIA)    Difficulty of Paying Living Expenses: Not very hard  Food Insecurity: No Food Insecurity (04/24/2023)   Hunger Vital Sign    Worried About Running Out of Food in the Last Year: Never true    Ran Out of Food in the Last Year: Never true  Transportation Needs: No Transportation Needs (04/24/2023)   PRAPARE - Administrator, Civil Service (Medical): No    Lack of Transportation (Non-Medical): No  Physical Activity: Inactive (09/18/2022)   Exercise Vital Sign    Days of Exercise per Week: 0 days    Minutes of Exercise per Session: 0 min  Stress: No Stress Concern Present (09/18/2022)   [employer] of Occupational Health - Occupational Stress Questionnaire    Feeling of Stress : Not at all  Social Connections: Moderately Integrated (09/18/2022)   Social Connection and Isolation Panel [NHANES]    Frequency of Communication with Friends and Family: More than three times a week    Frequency of Social Gatherings with Friends and Family: Three times a week    Attends Religious Services: More than 4 times per year    Active Member of Clubs or Organizations: No    Attends Banker Meetings:  Never    Marital Status: Married  Catering manager Violence: Not At Risk (04/24/2023)   Humiliation, Afraid, Rape, and Kick questionnaire    Fear of Current or Ex-Partner: No    Emotionally Abused: No    Physically Abused: No    Sexually Abused: No    Review of Systems: See HPI, otherwise negative ROS  Physical Exam: BP 130/81   Pulse (!) 102   Temp (!) 97.2 F (36.2 C) (Temporal)   Resp 16   Ht 5\' 5"  (1.651 m)   Wt 95.3 kg   SpO2 100%   BMI 34.98 kg/m  General:   Alert,  pleasant and cooperative in NAD Head:  Normocephalic and atraumatic. Neck:  Supple; no masses or thyromegaly. Lungs:  Clear throughout to auscultation.    Heart:  Regular rate and rhythm. Abdomen:  Soft, nontender and nondistended. Normal bowel sounds, without guarding, and without rebound.   Neurologic:  Alert and  oriented x4;  grossly normal neurologically.  Impression/Plan: Kathryn Estrada is here for an endoscopy to be performed for dyspepsia  Risks, benefits, limitations, and alternatives regarding  endoscopy have been reviewed with the patient.  Questions have been answered.  All parties agreeable.   Lannette Donath, MD  12/10/2023, 12:15 PM

## 2023-12-10 NOTE — Op Note (Signed)
[facility] Gastroenterology Patient Name: Kathryn Estrada Procedure Date: 12/10/2023 12:04 PM MRN: [id number] Account #: [id number] Date of Birth: March 29, 1996 Admit Type: Outpatient Age: 27 Room: [facility] ENDO ROOM 1 Gender: Female Note Status: Finalized Instrument Name: Upper Endoscope [id number] Procedure:             Upper GI endoscopy Indications:           Epigastric abdominal pain Providers:             Toney Reil MD, MD Referring MD:          Carlean Jews (Referring MD) Medicines:             General Anesthesia Complications:         No immediate complications. Estimated blood loss: None. Procedure:             Pre-Anesthesia Assessment:                        - Prior to the procedure, a History and Physical was                         performed, and patient medications and allergies were                         reviewed. The patient is competent. The risks and                         benefits of the procedure and the sedation options and                         risks were discussed with the patient. All questions                         were answered and informed consent was obtained.                         Patient identification and proposed procedure were                         verified by the physician, the nurse, the                         anesthesiologist, the anesthetist and the technician                         in the pre-procedure area in the procedure room in the                         endoscopy suite. Mental Status Examination: alert and                         oriented. Airway Examination: normal oropharyngeal                         airway and neck mobility. Respiratory Examination:                         clear to auscultation. CV Examination: normal.  Prophylactic Antibiotics: The patient does not require                         prophylactic antibiotics. Prior Anticoagulants: The                          patient has taken no anticoagulant or antiplatelet                         agents. ASA Grade Assessment: II - A patient with mild                         systemic disease. After reviewing the risks and                         benefits, the patient was deemed in satisfactory                         condition to undergo the procedure. The anesthesia                         plan was to use general anesthesia. Immediately prior                         to administration of medications, the patient was                         re-assessed for adequacy to receive sedatives. The                         heart rate, respiratory rate, oxygen saturations,                         blood pressure, adequacy of pulmonary ventilation, and                         response to care were monitored throughout the                         procedure. The physical status of the patient was                         re-assessed after the procedure.                        After obtaining informed consent, the endoscope was                         passed under direct vision. Throughout the procedure,                         the patient's blood pressure, pulse, and oxygen                         saturations were monitored continuously. The Endoscope                         was introduced through the mouth, and advanced to the  second part of duodenum. The upper GI endoscopy was                         accomplished without difficulty. The patient tolerated                         the procedure well. Findings:      The duodenal bulb and examined duodenum were normal. Biopsies were taken       with a cold forceps for histology.      The entire examined stomach was normal. Biopsies were taken with a cold       forceps for histology.      The cardia and gastric fundus were normal on retroflexion.      The gastroesophageal junction and examined esophagus were normal. Impression:            - Normal  duodenal bulb and examined duodenum. Biopsied.                        - Normal stomach. Biopsied.                        - Normal gastroesophageal junction and esophagus. Recommendation:        - Await pathology results.                        - Discharge patient to home (with escort).                        - Resume previous diet today.                        - Continue present medications. Procedure Code(s):     --- Professional ---                        [phone], Esophagogastroduodenoscopy, flexible,                         transoral; with biopsy, single or multiple Diagnosis Code(s):     --- Professional ---                        R10.13, Epigastric pain CPT copyright 2022 American Medical Association. All rights reserved. The codes documented in this report are preliminary and upon coder review may  be revised to meet current compliance requirements. Dr. Libby Maw Toney Reil MD, MD 12/10/2023 12:28:55 PM This report has been signed electronically. Number of Addenda: 0 Note Initiated On: 12/10/2023 12:04 PM Estimated Blood Loss:  Estimated blood loss: none.      [facility]

## 2023-12-11 ENCOUNTER — Encounter: Payer: Self-pay | Admitting: Physician Assistant

## 2023-12-11 ENCOUNTER — Ambulatory Visit: Payer: Medicaid Other | Admitting: Physician Assistant

## 2023-12-11 VITALS — Temp 98.1°F | Resp 16 | Ht 65.0 in | Wt 212.0 lb

## 2023-12-11 DIAGNOSIS — E669 Obesity, unspecified: Secondary | ICD-10-CM

## 2023-12-11 DIAGNOSIS — I951 Orthostatic hypotension: Secondary | ICD-10-CM

## 2023-12-11 DIAGNOSIS — Z0001 Encounter for general adult medical examination with abnormal findings: Secondary | ICD-10-CM

## 2023-12-11 DIAGNOSIS — R42 Dizziness and giddiness: Secondary | ICD-10-CM | POA: Diagnosis not present

## 2023-12-11 LAB — SURGICAL PATHOLOGY

## 2023-12-11 NOTE — Progress Notes (Signed)
[facility] [address] [place], [place] [address]  Internal MEDICINE  Office Visit Note  Patient Name: Kathryn Estrada  [id number]  [id number]  Date of Service: 12/11/2023  Chief Complaint  Patient presents with   Annual Exam   Gastroesophageal Reflux   Abdominal Pain    Constipation   Dizziness    Accompanied by headache, lightheadedness     HPI Pt is here for routine health maintenance examination, her mom and baby are with her -Had EGD yesterday, followed by GI. Still has abdominal pain and constipation. -Painful cramps near cycles. Thinks she has endometriosis but OBGYN told her the only way to dx is going in looking. Not interested in birth control to help symptoms either as she reports wt gain/increased appetite with this previously -Does still get dizzy, and almost feels like she could pass out at times. Will get clammy/hot and start to get tunnel vision briefly and then subsides. Just does not feel well. Difficult to not change position quickly with 2 young kids. BP in office done as orthostatics, BP dropped to 88/57 on standing. -Continues to always be thirsty and has excessive urination, A1c normal -has not been back to neurology. 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 Had vomiting with effexor the 2 nights she tried it, so never continued this. Also had a MRI done, but has not been back to neurology and did discuss following up with them still.  -concerned about weight still, has not been able to lose. Has used phentermine in the past and did ok with it for about a week but then body got used to it and didn't help. 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 States she tried ozempic as well without success. Discussed prioritizing dizziness/near syncope before further weight loss discussion  Current Medication: Outpatient  Encounter Medications as of 12/11/2023  Medication Sig   omeprazole (PRILOSEC) 40 MG capsule Take 1 capsule (40 mg total) by mouth daily before breakfast.   ondansetron (ZOFRAN-ODT) 4 MG disintegrating tablet Take 1 tablet (4 mg total) by mouth every 8 (eight) hours as needed for nausea or vomiting.   No facility-administered encounter medications on file as of 12/11/2023.    Surgical History: Past Surgical History:  Procedure Laterality Date   BIOPSY  12/10/2023   Procedure: BIOPSY;  Surgeon: Toney Reil, MD;  Location: [facility] ENDOSCOPY;  Service: Gastroenterology;;   CESAREAN SECTION N/A 04/03/2021   Procedure: CESAREAN SECTION;  Surgeon: Natale Milch, MD;  Location: [facility] ORS;  Service: Obstetrics;  Laterality: N/A;   CESAREAN SECTION N/A 04/24/2023   Procedure: REPEAT CESAREAN SECTION;  Surgeon: Hildred Laser, MD;  Location: [facility] ORS;  Service: Obstetrics;  Laterality: N/A;   ESOPHAGOGASTRODUODENOSCOPY (EGD) WITH PROPOFOL N/A 12/10/2023   Procedure: ESOPHAGOGASTRODUODENOSCOPY (EGD) WITH PROPOFOL;  Surgeon: Toney Reil, MD;  Location: [facility] ENDOSCOPY;  Service: Gastroenterology;  Laterality: N/A;   WISDOM TOOTH EXTRACTION     four;- age 7, 36, 74, 61    Medical History: Past Medical History:  Diagnosis Date   Anemia    Complication of anesthesia    takes alot of anesthetic for dental procedure to become numb   Elevated blood pressure reading    Family history of adverse reaction to anesthesia    takes alot of anesthetic at dentist to become numb   GERD (gastroesophageal reflux disease)    Raynaud's syndrome  without gangrene     Family History: Family History  Problem Relation Age of Onset   Neurofibromatosis Mother    Anemia Mother    Neurofibromatosis Sister    Neurofibromatosis Brother    Neurofibromatosis Brother    Schizophrenia Maternal Grandfather    Kidney disease Paternal Grandmother    Cancer Paternal Grandfather 63       colon       Review of Systems  Constitutional:  Positive for fatigue. Negative for chills and unexpected weight change.  HENT:  Negative for congestion, rhinorrhea, sneezing and sore throat.   Eyes:  Negative for redness.  Respiratory:  Negative for cough, chest tightness and shortness of breath.   Cardiovascular:  Negative for chest pain and palpitations.  Gastrointestinal:  Positive for abdominal pain and constipation. Negative for diarrhea, nausea and vomiting.  Genitourinary:  Negative for dysuria.  Musculoskeletal:  Negative for arthralgias, back pain, joint swelling and neck pain.  Skin:  Negative for rash.  Neurological:  Positive for dizziness, light-headedness, numbness and headaches. Negative for tremors.  Hematological:  Negative for adenopathy. Does not bruise/bleed easily.  Psychiatric/Behavioral:  Positive for dysphoric mood. Negative for sleep disturbance and suicidal ideas. Behavioral problem: Depression.The patient is not nervous/anxious.      Vital Signs: Temp 98.1 F (36.7 C)   Resp 16   Ht 5\' 5"  (1.651 m)   Wt 212 lb (96.2 kg)   SpO2 99%   BMI 35.28 kg/m  Orthostatic Vitals for the past 48 hrs (Last 6 readings):  Patient Position Orthostatic BP Orthostatic Pulse  12/11/23 1012 Supine 115/70 83  12/11/23 1021 Sitting 111/76 88  12/11/23 1022 Standing (!) 88/57 98      Physical Exam Vitals and nursing note reviewed.  Constitutional:      Appearance: Normal appearance.  HENT:     Head: Normocephalic and atraumatic.  Eyes:     Extraocular Movements: Extraocular movements intact.  Cardiovascular:     Rate and Rhythm: Normal rate and regular rhythm.  Pulmonary:     Effort: Pulmonary effort is normal.     Breath sounds: Normal breath sounds.  Abdominal:     Tenderness: There is no abdominal tenderness.  Musculoskeletal:        General: Normal range of motion.  Neurological:     Mental Status: She is alert and oriented to person, place, and time.   Psychiatric:        Thought Content: Thought content normal.        Judgment: Judgment normal.      LABS: Recent Results (from the past 2160 hours)  POCT urine pregnancy     Status: None   Collection Time: 10/08/23 12:09 PM  Result Value Ref Range   Preg Test, Ur Negative Negative  POCT urinalysis dipstick     Status: None   Collection Time: 10/08/23 12:10 PM  Result Value Ref Range   Color, UA     Clarity, UA     Glucose, UA Negative Negative   Bilirubin, UA negative    Ketones, UA trace    Spec Grav, UA 1.015 1.010 - 1.025   Blood, UA moderate    pH, UA 5.0 5.0 - 8.0   Protein, UA Negative Negative   Urobilinogen, UA 0.2 0.2 or 1.0 E.U./dL   Nitrite, UA negative    Leukocytes, UA Negative Negative   Appearance     Odor    CULTURE, URINE COMPREHENSIVE     Status: None  Collection Time: 10/08/23  3:58 PM   Specimen: Urine   Urine  Result Value Ref Range   Urine Culture, Comprehensive Final report    Organism ID, Bacteria Comment     Comment: Mixed urogenital flora 10,000-25,000 colony forming units per mL   Urinalysis, Routine w reflex microscopic -Urine, Clean Catch     Status: Abnormal   Collection Time: 10/24/23  2:41 PM  Result Value Ref Range   Color, Urine YELLOW (A) YELLOW   APPearance CLOUDY (A) CLEAR   Specific Gravity, Urine 1.026 1.005 - 1.030   pH 6.0 5.0 - 8.0   Glucose, UA NEGATIVE NEGATIVE mg/dL   Hgb urine dipstick LARGE (A) NEGATIVE   Bilirubin Urine NEGATIVE NEGATIVE   Ketones, ur NEGATIVE NEGATIVE mg/dL   Protein, ur 696 (A) NEGATIVE mg/dL   Nitrite NEGATIVE NEGATIVE   Leukocytes,Ua NEGATIVE NEGATIVE   RBC / HPF >50 0 - 5 RBC/hpf   WBC, UA 11-20 0 - 5 WBC/hpf   Bacteria, UA NONE SEEN NONE SEEN   Squamous Epithelial / HPF >50 0 - 5 /HPF    Comment: Performed at [facility], [address] [place], [place] [address]  Lipase, blood     Status: None   Collection Time: 10/24/23  3:21 PM  Result Value Ref Range   Lipase 31 11 -  51 U/L    Comment: Performed at [facility], [address] [place], [place] [address]  Comprehensive metabolic panel     Status: Abnormal   Collection Time: 10/24/23  3:21 PM  Result Value Ref Range   Sodium 134 (L) 135 - 145 mmol/L   Potassium 3.8 3.5 - 5.1 mmol/L   Chloride 102 98 - 111 mmol/L   CO2 23 22 - 32 mmol/L   Glucose, Bld 87 70 - 99 mg/dL    Comment: Glucose reference range applies only to samples taken after fasting for at least 8 hours.   BUN 13 6 - 20 mg/dL   Creatinine, Ser 4.01 0.44 - 1.00 mg/dL   Calcium 8.9 8.9 - 02.7 mg/dL   Total Protein 7.4 6.5 - 8.1 g/dL   Albumin 4.0 3.5 - 5.0 g/dL   AST 21 15 - 41 U/L   ALT 18 0 - 44 U/L   Alkaline Phosphatase 77 38 - 126 U/L   Total Bilirubin 0.4 0.3 - 1.2 mg/dL   GFR, Estimated >25 >36 mL/min    Comment: (NOTE) Calculated using the CKD-EPI Creatinine Equation (2021)    Anion gap 9 5 - 15    Comment: Performed at [facility], [address] [place], [place] [address]  CBC     Status: Abnormal   Collection Time: 10/24/23  3:21 PM  Result Value Ref Range   WBC 7.0 4.0 - 10.5 K/uL   RBC 5.35 (H) 3.87 - 5.11 MIL/uL   Hemoglobin 14.3 12.0 - 15.0 g/dL   HCT 47.4 25.9 - 56.3 %   MCV 84.1 80.0 - 100.0 fL   MCH 26.7 26.0 - 34.0 pg   MCHC 31.8 30.0 - 36.0 g/dL   RDW 87.5 64.3 - 32.9 %   Platelets 279 150 - 400 K/uL   nRBC 0.0 0.0 - 0.2 %    Comment: Performed at [facility], [address] [place], [place] [address]  POC urine preg, ED     Status: None   Collection Time: 10/24/23  4:11 PM  Result Value Ref Range   Preg  Test, Ur Negative Negative  Urine Culture     Status: Abnormal   Collection Time: 10/24/23  5:29 PM   Specimen: Urine, Clean Catch  Result Value Ref Range   Specimen Description      URINE, CLEAN CATCH Performed at [facility], [address] [place], [place] [address]    Special Requests      NONE Performed at [facility], [address] [place], [place] [address]    Culture (A)     <10,000 COLONIES/mL INSIGNIFICANT GROWTH Performed at [facility], [address] [place], [place] [address]    Report Status 10/26/2023 FINAL   H. pylori breath test     Status: None   Collection Time: 12/02/23 11:35 AM  Result Value Ref Range   H pylori Breath Test Negative Negative  Pregnancy, urine POC     Status: None   Collection Time: 12/10/23 11:45 AM  Result Value Ref Range   Preg Test, Ur NEGATIVE NEGATIVE    Comment:        THE SENSITIVITY OF THIS METHODOLOGY IS >24 mIU/mL         Assessment/Plan: 1. Encounter for general adult medical examination with abnormal findings (Primary) CPE performed, labs previously reviewed, UTD on pap  2. Orthostatic hypotension Orthostatic vitals and pt is symptomatic. Borderline low sodium previously and will start with adding gatorade daily to increase sodium and help BP. Discussed changing position slowly. Will also refer to cardiology. If new or worsening symptoms go to ED - Ambulatory referral to Cardiology  3. Dizziness - Ambulatory referral to Cardiology  4. Obesity (BMI 30-39.9) Continue to work on diet/exercise as able. Has tried wellbutrin, ozempic and phentermine short term in past without lasting success or with S/E. Will focus on acute concerns today and address further in future.    General Counseling: kjersten lichterman understanding of the findings of todays visit and agrees with plan of treatment. I have discussed any further diagnostic evaluation that may be needed or ordered today. We also reviewed her medications today. she has been encouraged to call the office with any questions or concerns that should arise related to todays visit.    Counseling:    Orders Placed This Encounter  Procedures   Ambulatory referral to Cardiology    No orders of the defined types were placed in this encounter.   This patient was seen by Lynn Ito, PA-C in  collaboration with Dr. Beverely Risen as a part of collaborative care agreement.  Total time spent:45 Minutes  Time spent includes review of chart, medications, test results, and follow up plan with the patient.     Lyndon Code, MD  Internal Medicine

## 2023-12-19 ENCOUNTER — Encounter: Payer: Self-pay | Admitting: Physician Assistant

## 2023-12-19 ENCOUNTER — Ambulatory Visit: Payer: Medicaid Other

## 2023-12-19 ENCOUNTER — Ambulatory Visit: Payer: Medicaid Other | Attending: Physician Assistant | Admitting: Emergency Medicine

## 2023-12-19 VITALS — BP 112/78 | HR 88 | Ht 65.0 in | Wt 208.0 lb

## 2023-12-19 DIAGNOSIS — F17200 Nicotine dependence, unspecified, uncomplicated: Secondary | ICD-10-CM | POA: Diagnosis not present

## 2023-12-19 DIAGNOSIS — R002 Palpitations: Secondary | ICD-10-CM | POA: Diagnosis not present

## 2023-12-19 DIAGNOSIS — Z79899 Other long term (current) drug therapy: Secondary | ICD-10-CM | POA: Diagnosis not present

## 2023-12-19 DIAGNOSIS — R42 Dizziness and giddiness: Secondary | ICD-10-CM

## 2023-12-19 NOTE — Progress Notes (Signed)
Cardiology Office Note:    Date:  12/19/2023  ID:  Arsenio Loader, DOB 01-11-1996, MRN [id number] PCP: Alan Ripper  [facility] Providers Cardiologist:  Debbe Odea, MD       Patient Profile:      Kathryn Estrada is a 27 year old female with history of Raynaud's disease.   She was originally evaluated by cardiology in 2022 for dizziness.  At that time she was [redacted] weeks pregnant.  Her dizziness was associated with change in positions and consistent with positional vertigo.  Orthostatic vital signs did not reveal any evidence of orthostasis.  Was recently seen by her PCP in December 2024 where she was found to be orthostatic, referred back to cardiology.      History of Present Illness:  Kathryn Estrada is a 27 y.o. female who returns for acute visit for dizziness.  History of Present Illness  The patient who presents with her mother and youngest child reports a two-year history of daily dizziness and near syncope.  She notes this started during her first pregnancy. she describes the dizziness as a constant feeling of being "drunk", with episodes of near syncope occurring at least once daily. These episodes are often accompanied by a sensation of heat and fuzzy vision, which she experiences all day, every day. The patient reports that her symptoms seem to worsen with quick movements and have led to instances where she needed to sit down abruptly for fear of passing out. She denies any complete loss of consciousness.  The dizziness tends to be extreme for 10 to 15 minutes then gradually improves.  She denies any spinning sensation such as her spinning or the room spinning.  She denies amaurosis fugax but notes sometimes she will see black spots in her vision.  She has seen multiple specialists, including an ENT and an ophthalmologist, and has undergone various tests, but the cause of her symptoms remains unclear.  She does not have excessive caffeine  intake this is not necessarily related to quick position changes.  She notes that she tends to stay hydrated and does not wear compression stockings.  Her orthostatic vital signs in clinic today were negative for orthostasis and noted below.  She has been treated by neurology for vestibular migraine however on she was treated with Effexor that made her sick to her stomach and she has stopped taking them.  She did have an MRI with neurology back in September 24 and she plans to follow back up with them regarding nonspecific brain fingings.  She has not alcohol intake, illicit drug use, tobacco use.  She does vape daily.  Orthostatic VS for the past 24 hrs (Last 3 readings):  BP- Lying Pulse- Lying BP- Sitting Pulse- Sitting BP- Standing at 0 minutes Pulse- Standing at 0 minutes BP- Standing at 3 minutes Pulse- Standing at 3 minutes  12/19/23 1403 105/74 88 116/77 102 113/83 102 106/72 114    She denies chest pain, shortness of breath, irregular heartbeats, lower extremity edema, palpitations, melena, hematuria, hemoptysis, weakness, syncope, orthopnea, and PND.    Review of Systems  Constitutional: Negative for weight gain and weight loss.  Cardiovascular:  Positive for near-syncope. Negative for chest pain, claudication, dyspnea on exertion, irregular heartbeat, leg swelling, orthopnea, palpitations, paroxysmal nocturnal dyspnea and syncope.  Respiratory:  Negative for cough, hemoptysis and shortness of breath.   Gastrointestinal:  Negative for hematochezia and melena.  Genitourinary:  Negative for hematuria.  Neurological:  Positive for dizziness and  light-headedness. Negative for headaches and weakness.     See HPI    Studies Reviewed:   EKG Interpretation Date/Time:  Friday December 19 2023 13:57:00 EST Ventricular Rate:  88 PR Interval:  134 QRS Duration:  72 QT Interval:  370 QTC Calculation: 447 R Axis:   8  Text Interpretation: Normal sinus rhythm Nonspecific T wave abnormality  Confirmed by Rise Paganini [phone]) on 12/19/2023 4:02:33 PM    VAS [place] ABI 06/15/2020 Right: Resting right ankle-brachial index is within normal range. No  evidence of significant right lower extremity arterial disease. The right  toe-brachial index is normal.   Left: Resting left ankle-brachial index is within normal range. No  evidence of significant left lower extremity arterial disease. The left  toe-brachial index is abnormal.  Risk Assessment/Calculations:             Physical Exam:   VS:  BP 112/78 (BP Location: Left Arm, Patient Position: Sitting, Cuff Size: Normal)   Pulse 88   Ht 5\' 5"  (1.651 m)   Wt 208 lb (94.3 kg)   SpO2 98%   BMI 34.61 kg/m    Wt Readings from Last 3 Encounters:  12/19/23 208 lb (94.3 kg)  12/11/23 212 lb (96.2 kg)  12/10/23 210 lb 3.2 oz (95.3 kg)    Constitutional:      Appearance: Normal and healthy appearance.  HENT:     Head: Normocephalic.  Neck:     Vascular: JVD normal.  Pulmonary:     Effort: Pulmonary effort is normal.     Breath sounds: Normal breath sounds.  Chest:     Chest wall: Not tender to palpatation.  Cardiovascular:     PMI at left midclavicular line. Normal rate. Regular rhythm. Normal S1. Normal S2.      Murmurs: There is no murmur.     No gallop.  No click. No rub.  Pulses:    Intact distal pulses.  Edema:    Peripheral edema absent.  Musculoskeletal: Normal range of motion.     Cervical back: Normal range of motion and neck supple. Skin:    General: Skin is warm and dry.  Neurological:     General: No focal deficit present.     Mental Status: Alert, oriented to person, place, and time and oriented to person, place and time.  Psychiatric:        Attention and Perception: Attention and perception normal.        Mood and Affect: Mood normal.        Behavior: Behavior is cooperative.        Thought Content: Thought content normal.        Assessment and Plan:      Dizziness / Lightheadedness / Blurred  vision -She notes daily episodes of dizziness described as feeling "drunk" with associated blurry/fuzzy vision and near syncope, particularly with sudden movements. Symptoms have been ongoing for approximately two years, since the birth of her first child.  Orthostatic vital signs negative for orthostasis in clinic today as noted in HPI.  No clear triggers identified however symptoms seem to be consistent with positional vertigo. Previous workup including ENT and ophthalmology evaluations have been unremarkable. -EKG today NSR with no ST-T wave changes  -Plan for echocardiogram to assess structural integrity and any valve abnormalities. 7-day Zio monitor to assess for any arrhythmias or abnormal heart rhythms, particularly during episodes of severe dizziness with associated near syncope.  She denies amaurosis fugax however he does note  fuzzy vision so we will order carotid ultrasound to rule out abnormalities in the carotid arteries.  Plan for CBC, BMP, mag today. -Ensure follow-up with neurology regarding nonspecific MRI brain findings and ongoing management of symptoms.   Vaping -Advise cessation of vaping, with a suggested goal of quitting by January 1st.         Dispo:  Return in about 3 months (around 03/18/2024).  Signed, Denyce Robert, NP

## 2023-12-19 NOTE — Patient Instructions (Addendum)
Medication Instructions:  Your Physician recommend you continue on your current medication as directed.    *If you need a refill on your cardiac medications before your next appointment, please call your pharmacy*   Lab Work: Your provider would like for you to have following labs drawn today CBC, BMP, Magnesium level.   If you have labs (blood work) drawn today and your tests are completely normal, you will receive your results only by: MyChart Message (if you have MyChart) OR A paper copy in the mail If you have any lab test that is abnormal or we need to change your treatment, we will call you to review the results.   Testing/Procedures: Your physician has requested that you have an echocardiogram. Echocardiography is a painless test that uses sound waves to create images of your heart. It provides your doctor with information about the size and shape of your heart and how well your heart's chambers and valves are working.   You may receive an ultrasound enhancing agent through an IV if needed to better visualize your heart during the echo. This procedure takes approximately one hour.  There are no restrictions for this procedure.  This will take place at [address] [facility] [address] [facility]) [address], [place] [address]  Please note: We ask at that you not bring children with you during ultrasound (echo/ vascular) testing. Due to room size and safety concerns, children are not allowed in the ultrasound rooms during exams. Our front office staff cannot provide observation of children in our lobby area while testing is being conducted. An adult accompanying a patient to their appointment will only be allowed in the ultrasound room at the discretion of the ultrasound technician under special circumstances. We apologize for any inconvenience.  ZIO XT- Long Term Monitor Instructions  Your physician has requested you wear a ZIO patch monitor for 14 days.  This is a single patch monitor.  [employer] supplies one patch monitor per enrollment. Additional stickers are not available. Please do not apply patch if you will be having a Nuclear Stress Test, Echocardiogram, Cardiac CT, MRI, or Chest Xray during the period you would be wearing the monitor. The patch cannot be worn during these tests. You cannot remove and re-apply the ZIO XT patch monitor.  Your ZIO patch monitor will be mailed 3 day USPS to your address on file. It may take 3-5 days to receive your monitor after you have been enrolled.  Once you have received your monitor, please review the enclosed instructions. Your monitor has already been registered assigning a specific monitor serial # to you.  Billing and Patient Assistance Program Information  We have supplied [employer] with any of your insurance information on file for billing purposes. [employer] offers a sliding scale Patient Assistance Program for patients that do not have  insurance, or whose insurance does not completely cover the cost of the ZIO monitor.  You must apply for the Patient Assistance Program to qualify for this discounted rate.  To apply, please call [employer] at [phone], select option 4, select option 2, ask to apply for Patient Assistance Program. Meredeth Ide will ask your household income, and how many people are in your household. They will quote your out-of-pocket cost based on that information. [employer] will also be able to set up a 3-month, interest-free payment plan if needed.  Applying the monitor   Hold abrader disc by orange tab. Rub abrader in 40 strokes over the upper left chest as  indicated in your  monitor instructions.  Clean area with 4 enclosed alcohol pads. Let dry.  Apply patch as indicated in monitor instructions. Patch will be placed under collarbone on left side of chest with arrow pointing upward.  Rub patch adhesive wings for 2 minutes. Remove white label marked "1". Remove the white label marked "2". Rub patch adhesive wings for 2  additional minutes.  While looking in a mirror, press and release button in center of patch. A small green light will  flash 3-4 times. This will be your only indicator that the monitor has been turned on.  Do not shower for the first 24 hours. You may shower after the first 24 hours.  Press the button if you feel a symptom. You will hear a small click. Record Date, Time and  Symptom in the Patient Logbook.  When you are ready to remove the patch, follow instructions on the last 2 pages of Patient  Logbook. Stick patch monitor onto the last page of Patient Logbook.  Place Patient Logbook in the blue and white box. Use locking tab on box and tape box closed securely. The blue and white box has prepaid postage on it. Please place it in the mailbox as soon as possible. Your physician should have your test results approximately 7 days after the monitor has been mailed back to [facility].  Call [facility] Customer Care at [phone] if you have questions regarding your ZIO XT patch monitor. Call them immediately if you see an orange light blinking on your monitor.  If your monitor falls off in less than 4 days, contact our Monitor department at [phone].  If your monitor becomes loose or falls off after 4 days call [employer] at [phone] for  suggestions on securing your monitor.   Your physician has requested that you have a carotid duplex. This test is an ultrasound of the carotid arteries in your neck. It looks at blood flow through these arteries that supply the brain with blood.   Allow one hour for this exam.  There are no restrictions or special instructions.  This will take place at [address] [facility] [address] [facility]) [address], [place] [address]  Please note: We ask at that you not bring children with you during ultrasound (echo/ vascular) testing. Due to room size and safety concerns, children are not allowed in the ultrasound rooms during exams. Our front office staff  cannot provide observation of children in our lobby area while testing is being conducted. An adult accompanying a patient to their appointment will only be allowed in the ultrasound room at the discretion of the ultrasound technician under special circumstances. We apologize for any inconvenience.    Follow-Up: At [facility], you and your health needs are our priority.  As part of our continuing mission to provide you with exceptional heart care, we have created designated Provider Care Teams.  These Care Teams include your primary Cardiologist (physician) and Advanced Practice Providers (APPs -  Physician Assistants and Nurse Practitioners) who all work together to provide you with the care you need, when you need it.  We recommend signing up for the patient portal called "MyChart".  Sign up information is provided on this After Visit Summary.  MyChart is used to connect with patients for Virtual Visits (Telemedicine).  Patients are able to view lab/test results, encounter notes, upcoming appointments, etc.  Non-urgent messages can be sent to your provider as well.   To learn more about what you can do with MyChart,  go to [email or web address].    Your next appointment:   3 month(s)  Provider:   You may see Debbe Odea, MD or one of the following Advanced Practice Providers on your designated Care Team:   Eula Listen, [place]

## 2023-12-20 LAB — BASIC METABOLIC PANEL
BUN/Creatinine Ratio: 15 (ref 9–23)
BUN: 14 mg/dL (ref 6–20)
CO2: 23 mmol/L (ref 20–29)
Calcium: 9.8 mg/dL (ref 8.7–10.2)
Chloride: 103 mmol/L (ref 96–106)
Creatinine, Ser: 0.91 mg/dL (ref 0.57–1.00)
Glucose: 70 mg/dL (ref 70–99)
Potassium: 4.4 mmol/L (ref 3.5–5.2)
Sodium: 141 mmol/L (ref 134–144)
eGFR: 89 mL/min/{1.73_m2} (ref >59)

## 2023-12-20 LAB — MAGNESIUM: Magnesium: 2 mg/dL (ref 1.6–2.3)

## 2023-12-20 LAB — CBC
Hematocrit: 41 % (ref 34.0–46.6)
Hemoglobin: 13.1 g/dL (ref 11.1–15.9)
MCH: 26.7 pg (ref 26.6–33.0)
MCHC: 32 g/dL (ref 31.5–35.7)
MCV: 84 fL (ref 79–97)
Platelets: 335 10*3/uL (ref 150–450)
RBC: 4.9 x10E6/uL (ref 3.77–5.28)
RDW: 12.5 % (ref 11.7–15.4)
WBC: 8.5 10*3/uL (ref 3.4–10.8)

## 2023-12-24 DIAGNOSIS — R42 Dizziness and giddiness: Secondary | ICD-10-CM

## 2023-12-26 ENCOUNTER — Telehealth: Payer: Self-pay

## 2023-12-26 MED ORDER — DOXYCYCLINE HYCLATE 100 MG PO TABS: 100.0000 mg | ORAL_TABLET | Freq: Two times a day (BID) | ORAL | 0 refills | Status: AC

## 2023-12-26 MED ORDER — BISMUTH SUBSALICYLATE 262 MG PO CHEW: CHEWABLE_TABLET | ORAL | 0 refills | Status: DC

## 2023-12-26 MED ORDER — METRONIDAZOLE 500 MG PO TABS: 500.0000 mg | ORAL_TABLET | Freq: Three times a day (TID) | ORAL | 0 refills | Status: AC

## 2023-12-26 MED ORDER — OMEPRAZOLE 20 MG PO CPDR: 20.0000 mg | DELAYED_RELEASE_CAPSULE | Freq: Two times a day (BID) | ORAL | 0 refills | Status: DC

## 2023-12-26 NOTE — Telephone Encounter (Signed)
 Called patient and patient verbalized understanding of results. Put a reminder for 6 weeks. Her insurance did not cover the pylera. Sent the 4 medications to the pharmacy

## 2023-12-26 NOTE — Telephone Encounter (Signed)
-----   Message from [facility] sent at 12/16/2023  9:40 AM EST ----- Please inform patient that the pathology results from upper endoscopy came back positive for H. pylori infection  Recommend Pylera pack if approved by patient's insurance and patient could afford, if not quadruple therapy as detailed below for 14 days total  Bismuth  subsalicylate 2 tablets each 4 times daily with meals and at bedtime Doxycycline  100mg  BID with full glass of water after a meal, stay upright at least for 30 minutes Metronidazole  500 mg 3 times daily with meals Omeprazole  20 mg twice daily before breakfast and dinner  Order H Pylori breath test in 4weeks after completing medication to confirm eradication.  Patient should be off prilosec and H2 blocker atleast for 2weeks before the test  Corinn JONELLE Brooklyn, MD [address]  [address]  [place], [place] [address]  Main: [phone]  Fax: [phone] Pager: [phone]

## 2024-01-05 ENCOUNTER — Telehealth: Payer: Self-pay | Admitting: Physician Assistant

## 2024-01-05 NOTE — Telephone Encounter (Signed)
 Appt cancel request kids had stomach bug will call back to reschedule her appt as she felt she was coming down as well forward to [place].

## 2024-01-06 ENCOUNTER — Ambulatory Visit: Payer: Medicaid Other | Admitting: Internal Medicine

## 2024-01-15 ENCOUNTER — Ambulatory Visit: Payer: Medicaid Other | Attending: Emergency Medicine

## 2024-01-15 ENCOUNTER — Ambulatory Visit (INDEPENDENT_AMBULATORY_CARE_PROVIDER_SITE_OTHER): Payer: Medicaid Other

## 2024-01-15 DIAGNOSIS — R42 Dizziness and giddiness: Secondary | ICD-10-CM

## 2024-01-15 DIAGNOSIS — R55 Syncope and collapse: Secondary | ICD-10-CM

## 2024-01-15 LAB — ECHOCARDIOGRAM COMPLETE
AR max vel: 3.38 cm2
AV Area VTI: 3.5 cm2
AV Area mean vel: 3.41 cm2
AV Mean grad: 2 mm[Hg]
AV Peak grad: 4.5 mm[Hg]
Ao pk vel: 1.06 m/s
Area-P 1/2: 4.68 cm2
Calc EF: 48.5 %
S' Lateral: 3.3 cm
Single Plane A2C EF: 49.6 %
Single Plane A4C EF: 50.1 %

## 2024-01-19 ENCOUNTER — Telehealth: Payer: Self-pay

## 2024-01-19 NOTE — Telephone Encounter (Signed)
Patient states she is having abdominal burning that comes and comes. She has abdominal pain her epigastric area the pain comes and goes.  States last night the pain happened at 10:00pm last night. The pain will last for 25 minutes last night and then went away. She states she has not paid attention when the pain happens usually if it after or before she eats, or first thing in the morning. She was positive for H pylori on 12/26/2023 and has finish taking the antibiotics. She has not retest for h pylori to make sure the h pylori is clear up. She will be due for this in 2 weeks.

## 2024-01-19 NOTE — Telephone Encounter (Signed)
Patient verbalized understanding of instructions

## 2024-02-02 ENCOUNTER — Telehealth: Payer: Self-pay

## 2024-02-02 DIAGNOSIS — R1011 Right upper quadrant pain: Secondary | ICD-10-CM

## 2024-02-02 DIAGNOSIS — R1013 Epigastric pain: Secondary | ICD-10-CM

## 2024-02-02 DIAGNOSIS — A048 Other specified bacterial intestinal infections: Secondary | ICD-10-CM

## 2024-02-02 NOTE — Telephone Encounter (Signed)
 Recommend HIDA scan Recommend trial of IBS Rela samples for IBS constipation Will follow-up on H. pylori breath test Follow-up appointment as scheduled  RV

## 2024-02-02 NOTE — Telephone Encounter (Signed)
 Patient mother is calling because patient is having upper and lower abdominal pain that has been going on all weekend. The pain the is a sharp pain. The pain comes and goes in severity but the pain never goes away. States Saturday night she was double over in the pain. She is having abdominal burning. She is having white sand color stool that is normal in consisently. Mother does not know how many times a day. She is having rectal bleeding with every bowel movement on the toilet paper Does not know if it was in the toilet bowel. Patient had H pylori on 12/26/2023 and started treatment supposed to recheck h pylori in 4 days.  Made appointment for patient for 02/10/2024 please advise what you recommend till appointment

## 2024-02-03 NOTE — Telephone Encounter (Addendum)
Order the [facility] scan and got patient schedule for 02/09/2024 arrived 7:30am for 8:00am [facility]. Nothing to eat or drink after midnight. Called patient and patient verbalized understanding of instructions. She states she wants to wait to have the H pylori breath test till her appointment on Tuesday because she states the last breath test came back negative and then the EGD showed the H pylori. She state she wants to talk to the provider. She states she will have her mom come pick up the samples informed her to take them 1 tablet twice daily

## 2024-02-03 NOTE — Addendum Note (Signed)
Addended by: Radene Knee L on: 02/03/2024 08:46 AM   Modules accepted: Orders

## 2024-02-06 ENCOUNTER — Telehealth: Payer: Self-pay

## 2024-02-06 NOTE — Telephone Encounter (Signed)
-----   Message from [facility] sent at 12/26/2023  9:39 AM EST ----- Order H Pylori breath test in 4weeks after completing medication to confirm eradication.  Patient should be off prilosec and H2 blocker atleast for 2weeks before the test.

## 2024-02-06 NOTE — Telephone Encounter (Signed)
Patient wants to wait to have lab on Tuesday when she has the appointment with Dr. Allegra Lai.

## 2024-02-09 ENCOUNTER — Ambulatory Visit
Admission: RE | Admit: 2024-02-09 | Discharge: 2024-02-09 | Disposition: A | Discharge status: Home / Self Care | Payer: Medicaid Other | Source: Ambulatory Visit | Attending: Gastroenterology | Admitting: Gastroenterology

## 2024-02-09 DIAGNOSIS — R1013 Epigastric pain: Secondary | ICD-10-CM | POA: Diagnosis not present

## 2024-02-09 DIAGNOSIS — K5900 Constipation, unspecified: Secondary | ICD-10-CM | POA: Diagnosis present

## 2024-02-09 DIAGNOSIS — R1011 Right upper quadrant pain: Secondary | ICD-10-CM | POA: Diagnosis not present

## 2024-02-09 MED ORDER — TECHNETIUM TC 99M MEBROFENIN IV KIT
5.3900 | PACK | Freq: Once | INTRAVENOUS | Status: AC | PRN
  Administered 2024-02-09: 5.39 via INTRAVENOUS

## 2024-02-10 ENCOUNTER — Telehealth: Payer: Self-pay

## 2024-02-10 ENCOUNTER — Ambulatory Visit (INDEPENDENT_AMBULATORY_CARE_PROVIDER_SITE_OTHER): Payer: Medicaid Other | Admitting: Gastroenterology

## 2024-02-10 ENCOUNTER — Encounter: Payer: Self-pay | Admitting: Gastroenterology

## 2024-02-10 VITALS — BP 108/70 | HR 89 | Temp 97.3°F | Ht 65.0 in | Wt 216.2 lb

## 2024-02-10 DIAGNOSIS — R109 Unspecified abdominal pain: Secondary | ICD-10-CM | POA: Diagnosis not present

## 2024-02-10 DIAGNOSIS — R195 Other fecal abnormalities: Secondary | ICD-10-CM | POA: Diagnosis not present

## 2024-02-10 DIAGNOSIS — R194 Change in bowel habit: Secondary | ICD-10-CM

## 2024-02-10 DIAGNOSIS — R635 Abnormal weight gain: Secondary | ICD-10-CM | POA: Diagnosis not present

## 2024-02-10 DIAGNOSIS — R110 Nausea: Secondary | ICD-10-CM | POA: Diagnosis not present

## 2024-02-10 DIAGNOSIS — A048 Other specified bacterial intestinal infections: Secondary | ICD-10-CM

## 2024-02-10 DIAGNOSIS — Z8619 Personal history of other infectious and parasitic diseases: Secondary | ICD-10-CM

## 2024-02-10 MED ORDER — OMEPRAZOLE 20 MG PO CPDR
20.0000 mg | DELAYED_RELEASE_CAPSULE | Freq: Every day | ORAL | 2 refills | Status: DC
Start: 2024-02-10 — End: 2024-02-23

## 2024-02-10 MED ORDER — HYOSCYAMINE SULFATE 0.125 MG SL SUBL: 0.1250 mg | SUBLINGUAL_TABLET | Freq: Three times a day (TID) | SUBLINGUAL | 0 refills | Status: DC | PRN

## 2024-02-10 NOTE — Progress Notes (Unsigned)
Arlyss Repress, MD [address]  [address]  [place], [place] [address]  Main: [phone]  Fax: [phone]    Gastroenterology Consultation  Referring Provider:     Carlean Jews, PA* Primary Care Physician:  Carlean Jews, PA-C Primary Gastroenterologist:  Dr. Arlyss Repress Reason for Consultation: Epigastric pain        HPI:   Kathryn Estrada is a 28 y.o. female referred by Carlean Jews, PA-C  for consultation & management of epigastric pain.  Patient reports that since April, she has been experiencing acute episodes of sharp/stabbing epigastric pain radiating to right subcostal margin, right shoulder and right upper back associated with nausea.  She had 4-5 episodes to date.  Each episode usually lasts about 30 minutes and most recent 1 lasted for few hours.  She had heartburn from 36 weeks to 38 weeks and took over-the-counter Nexium which provided relief.  These acute episodes are different from heartburn.  Patient never experienced heartburn before her pregnancy.  She did not have similar episodes of epigastric pain prior to her pregnancy.  She recently delivered a healthy, full-term baby, underwent a C-section.  She has 2 kids.  She does acknowledge consumption of simple carbs, carbonated beverages, sugary drinks, fast foods.  She also notices lower abdominal cramps whenever she tries to hold her stool when there is bathroom access issue.  Generally, she denies having any constipation or diarrhea.  She denies any abdominal bloating.  She reports having hemorrhoidal symptoms since her first pregnancy and worse during pregnancy and notices occasional bright red blood per rectum.  She denies any rectal urgency.  Follow-up visit 11/06/2023 Markela is here for follow-up of upper abdominal discomfort associated with nausea as well as irregular bowel movements.  She underwent right upper quadrant ultrasound, did not reveal cholelithiasis.  She does report  constipation for which she takes MiraLAX every other day.  She admits to not eating healthy.  She has been gaining weight post pregnancy and since last visit.  She is accompanied by her mom and her baby today.  She went to ER earlier this month secondary to abdominal pain, underwent CT renal protocol which revealed left-sided nonobstructing kidney stone and she was advised to drink plenty of water.  Serum lipase, LFTs, CBC were normal.  She denies any abdominal bloating.  Patient does not smoke or drink alcohol  NSAIDs: None  Antiplts/Anticoagulants/Anti thrombotics: None  GI Procedures: None  Past Medical History:  Diagnosis Date   Anemia    Complication of anesthesia    takes alot of anesthetic for dental procedure to become numb   Elevated blood pressure reading    Family history of adverse reaction to anesthesia    takes alot of anesthetic at dentist to become numb   GERD (gastroesophageal reflux disease)    Raynaud's syndrome without gangrene     Past Surgical History:  Procedure Laterality Date   BIOPSY  12/10/2023   Procedure: BIOPSY;  Surgeon: Toney Reil, MD;  Location: [facility] ENDOSCOPY;  Service: Gastroenterology;;   CESAREAN SECTION N/A 04/03/2021   Procedure: CESAREAN SECTION;  Surgeon: Natale Milch, MD;  Location: [facility] ORS;  Service: Obstetrics;  Laterality: N/A;   CESAREAN SECTION N/A 04/24/2023   Procedure: REPEAT CESAREAN SECTION;  Surgeon: Hildred Laser, MD;  Location: [facility] ORS;  Service: Obstetrics;  Laterality: N/A;   ESOPHAGOGASTRODUODENOSCOPY (EGD) WITH PROPOFOL N/A 12/10/2023   Procedure: ESOPHAGOGASTRODUODENOSCOPY (EGD) WITH PROPOFOL;  Surgeon: Lannette Donath  Betti Cruz, MD;  Location: [facility] ENDOSCOPY;  Service: Gastroenterology;  Laterality: N/A;   WISDOM TOOTH EXTRACTION     [age];- age 74, 21, 63, 77     Current Outpatient Medications:    bismuth subsalicylate (PEPTO BISMOL) 262 MG chewable tablet, Chew 2 tablets 4 times a day with meals and at  bedtime  for 14 days, Disp: 112 tablet, Rfl: 0   omeprazole (PRILOSEC) 20 MG capsule, Take 1 capsule (20 mg total) by mouth 2 (two) times daily before a meal for 14 days., Disp: 28 capsule, Rfl: 0   ondansetron (ZOFRAN-ODT) 4 MG disintegrating tablet, Take 1 tablet (4 mg total) by mouth every 8 (eight) hours as needed for nausea or vomiting., Disp: 20 tablet, Rfl: 4   Family History  Problem Relation Age of Onset   Neurofibromatosis Mother    Anemia Mother    Neurofibromatosis Sister    Neurofibromatosis Brother    Neurofibromatosis Brother    Schizophrenia Maternal Grandfather    Kidney disease Paternal Grandmother    Cancer Paternal Grandfather 79       colon     Social History   Tobacco Use   Smoking status: Every Day    Types: E-cigarettes   Smokeless tobacco: Never   Tobacco comments:    Quit cigarettes 08-28-2020, now vapes   Vaping Use   Vaping status: Every Day   Last attempt to quit: 08/28/2020   Devices: vape   Substance Use Topics   Alcohol use: Not Currently    Comment: ocassinally/ once a month    Drug use: Never    Allergies as of 02/10/2024   (No Known Allergies)    Review of Systems:    All systems reviewed and negative except where noted in HPI.   Physical Exam:  LMP 02/09/2024 (Approximate)  Patient's last menstrual period was 02/09/2024 (approximate).  General:   Alert,  Well-developed, well-nourished, pleasant and cooperative in NAD Head:  Normocephalic and atraumatic. Eyes:  Sclera clear, no icterus.   Conjunctiva pink. Ears:  Normal auditory acuity. Nose:  No deformity, discharge, or lesions. Mouth:  No deformity or lesions,oropharynx pink & moist. Neck:  Supple; no masses or thyromegaly. Lungs:  Respirations even and unlabored.  Clear throughout to auscultation.   No wheezes, crackles, or rhonchi. No acute distress. Heart:  Regular rate and rhythm; no murmurs, clicks, rubs, or gallops. Abdomen:  Normal bowel sounds. Soft, nontender and  non-distended without masses, hepatosplenomegaly or hernias noted.  No guarding or rebound tenderness.   Rectal: Not performed Msk:  Symmetrical without gross deformities. Good, equal movement & strength bilaterally. Pulses:  Normal pulses noted. Extremities:  No clubbing or edema.  No cyanosis. Neurologic:  Alert and oriented x3;  grossly normal neurologically. Skin:  Intact without significant lesions or rashes. No jaundice. Psych:  Alert and cooperative. Normal mood and affect.  Imaging Studies: No abdominal imaging  Assessment and Plan:   Jaimey Franchini is a 28 y.o. pleasant Caucasian female with BMI 35.34 is seen in consultation for chronic symptoms of upper abdominal pain associated with nausea, symptoms not related to food, irregular bowel movements.  Right upper quadrant ultrasound did not reveal cholelithiasis Recommend H. pylori breath test Reiterated about healthy diet, cut back on simple carbs, avoid carbonated beverages, sugary drinks, fast foods, red meat Increase MiraLAX to daily from every other day   Follow up based on the above workup   Arlyss Repress, MD

## 2024-02-10 NOTE — Telephone Encounter (Signed)
Patient was informed at appointment today

## 2024-02-10 NOTE — Telephone Encounter (Signed)
-----   Message from [facility] sent at 02/10/2024 12:51 PM EST ----- HIDA scan results came back normal Waiting on H. pylori breath test results  RV

## 2024-02-13 ENCOUNTER — Other Ambulatory Visit: Payer: Self-pay | Admitting: Nurse Practitioner

## 2024-02-13 DIAGNOSIS — R110 Nausea: Secondary | ICD-10-CM

## 2024-02-13 NOTE — Telephone Encounter (Signed)
 Please review and send

## 2024-02-17 ENCOUNTER — Encounter: Payer: Self-pay | Admitting: Nurse Practitioner

## 2024-02-17 ENCOUNTER — Ambulatory Visit: Payer: Medicaid Other | Admitting: Nurse Practitioner

## 2024-02-17 VITALS — BP 120/86 | HR 77 | Temp 98.4°F | Resp 16 | Ht 65.0 in | Wt 216.4 lb

## 2024-02-17 DIAGNOSIS — N390 Urinary tract infection, site not specified: Secondary | ICD-10-CM | POA: Diagnosis not present

## 2024-02-17 DIAGNOSIS — R319 Hematuria, unspecified: Secondary | ICD-10-CM

## 2024-02-17 DIAGNOSIS — N9412 Deep dyspareunia: Secondary | ICD-10-CM | POA: Diagnosis not present

## 2024-02-17 DIAGNOSIS — R300 Dysuria: Secondary | ICD-10-CM | POA: Diagnosis not present

## 2024-02-17 DIAGNOSIS — N946 Dysmenorrhea, unspecified: Secondary | ICD-10-CM | POA: Diagnosis not present

## 2024-02-17 LAB — POCT URINALYSIS DIPSTICK
Bilirubin, UA: NEGATIVE
Glucose, UA: NEGATIVE
Ketones, UA: NEGATIVE
Nitrite, UA: NEGATIVE
Protein, UA: NEGATIVE
Spec Grav, UA: 1.01 (ref 1.010–1.025)
Urobilinogen, UA: 0.2 U/dL
pH, UA: 5 (ref 5.0–8.0)

## 2024-02-17 MED ORDER — CIPROFLOXACIN HCL 500 MG PO TABS: 500.0000 mg | ORAL_TABLET | Freq: Two times a day (BID) | ORAL | 0 refills | Status: AC

## 2024-02-17 NOTE — Progress Notes (Signed)
 [facility] [address] [place], [place] [address]  Internal MEDICINE  Office Visit Note  Patient Name: Kathryn Estrada  [id number]  [id number]  Date of Service: 02/17/2024  Chief Complaint  Patient presents with   Acute Visit    In a lot of pain   Urinary Tract Infection     HPI Kathryn Estrada presents for an acute sick visit for possible UTI.  Lower abdominal/pelvic Pain with intercourse, pain with tampon insertion, pain with bowel movement esp with straining. No family history of endometriosis. Also has severe menstrual cramping.  UTI -- urinalysis showed trace blood and trace leukocytes.      Current Medication:  Outpatient Encounter Medications as of 02/17/2024  Medication Sig   bismuth subsalicylate (PEPTO BISMOL) 262 MG chewable tablet Chew 2 tablets 4 times a day with meals and at bedtime  for 14 days   ciprofloxacin (CIPRO) 500 MG tablet Take 1 tablet (500 mg total) by mouth 2 (two) times daily for 5 days. Take with food   hyoscyamine (LEVSIN/SL) 0.125 MG SL tablet Place 1 tablet (0.125 mg total) under the tongue every 8 (eight) hours as needed.   omeprazole (PRILOSEC) 20 MG capsule Take 1 capsule (20 mg total) by mouth daily before breakfast.   ondansetron (ZOFRAN-ODT) 4 MG disintegrating tablet TAKE 1 TABLET BY MOUTH EVERY 8 HOURS AS NEEDED FOR NAUSEA AND VOMITING   No facility-administered encounter medications on file as of 02/17/2024.      Medical History: Past Medical History:  Diagnosis Date   Anemia    Complication of anesthesia    takes alot of anesthetic for dental procedure to become numb   Elevated blood pressure reading    Family history of adverse reaction to anesthesia    takes alot of anesthetic at dentist to become numb   GERD (gastroesophageal reflux disease)    Raynaud's syndrome without gangrene      Vital Signs: BP 120/86   Pulse 77   Temp 98.4 F (36.9 C)   Resp 16   Ht 5\' 5"  (1.651 m)   Wt 216 lb 6.4 oz (98.2 kg)    LMP 02/09/2024 (Approximate)   SpO2 97%   BMI 36.01 kg/m    Review of Systems  Constitutional:  Positive for fatigue.  Respiratory:  Negative for cough, chest tightness, shortness of breath and wheezing.   Cardiovascular:  Negative for chest pain and palpitations.  Genitourinary:  Positive for dyspareunia, dysuria, frequency, hematuria, menstrual problem and urgency. Negative for vaginal bleeding, vaginal discharge and vaginal pain.    Physical Exam Vitals reviewed.  Constitutional:      General: She is not in acute distress.    Appearance: Normal appearance. She is obese. She is not ill-appearing.  Eyes:     Pupils: Pupils are equal, round, and reactive to light.  Cardiovascular:     Rate and Rhythm: Normal rate and regular rhythm.  Pulmonary:     Effort: Pulmonary effort is normal. No respiratory distress.  Neurological:     Mental Status: She is alert and oriented to person, place, and time.  Psychiatric:        Mood and Affect: Mood normal.        Behavior: Behavior normal.       Assessment/Plan: 1. Urinary tract infection with hematuria, site unspecified (Primary) Urine culture sent and cipro prescribed, take until gone.  - CULTURE, URINE COMPREHENSIVE - ciprofloxacin (CIPRO) 500 MG tablet; Take 1 tablet (500 mg total) by  mouth 2 (two) times daily for 5 days. Take with food  Dispense: 10 tablet; Refill: 0  2. Deep dyspareunia in female Patient has an appt next week to discuss this problem with her OBGYN  3. Severe menstrual cramps Patient has appt next week with her OBGYN  4. Dysuria Urinalysis is positive for trace blood and trace leukocytes.  - POCT Urinalysis Dipstick   General Counseling: brailee riede understanding of the findings of todays visit and agrees with plan of treatment. I have discussed any further diagnostic evaluation that may be needed or ordered today. We also reviewed her medications today. she has been encouraged to call the office  with any questions or concerns that should arise related to todays visit.    Counseling:    Orders Placed This Encounter  Procedures   CULTURE, URINE COMPREHENSIVE   POCT Urinalysis Dipstick    Meds ordered this encounter  Medications   ciprofloxacin (CIPRO) 500 MG tablet    Sig: Take 1 tablet (500 mg total) by mouth 2 (two) times daily for 5 days. Take with food    Dispense:  10 tablet    Refill:  0    Fill new script today    Return if symptoms worsen or fail to improve.  [place] Controlled Substance Database was reviewed by me for overdose risk score (ORS)  Time spent:30 Minutes Time spent with patient included reviewing progress notes, labs, imaging studies, and discussing plan for follow up.   This patient was seen by Sallyanne Kuster, FNP-C in collaboration with Dr. Beverely Risen as a part of collaborative care agreement.  Madisen Ludvigsen R. Tedd Sias, MSN, FNP-C Internal Medicine

## 2024-02-19 NOTE — Progress Notes (Signed)
 GYNECOLOGY PROGRESS NOTE  Subjective:  PCP: Carlean Jews, PA-C  Patient ID: Kathryn Estrada, female    DOB: Apr 02, 1996, 28 y.o.   MRN: [id number]  HPI  Patient is a 28 y.o. G54P2002 female who presents for history of heavy periods her whole life, and newer onset of painful intercourse. Pt's periods have always been very heavy and painful. They are regular monthly and last about 5 days. The first 2 days, flow is heavy, uses super tampons and changes Q1hr. Cramps are so painful she feels she can't perform ADLs and has to take 3 tabs of Aleve Q3hrs to "just get through." She was started on COCs at some point to help with this, but it caused her to break out and increased her appetite, so she self-Dced them fairly quickly. Currently using condoms as contraception. Is thinking about one more baby in another year or so.   She recently had a 2nd c-section May 2nd, 2024 and about 3 mos after that delivery, intercourse because very painful with deep penetration. She also notices pain with bowel movements and pain with tampon insertion. Pain occurs deeper in the vaginal canal and feels cramping in her pelvis. Is sometimes so painful she cries.   The following portions of the patient's history were reviewed and updated as appropriate: allergies, current medications, past family history, past medical history, past social history, past surgical history, and problem list.  Review of Systems Pertinent items are noted in HPI.   Objective:   Blood pressure 122/88, pulse 87, height 5\' 5"  (1.651 m), weight 217 lb 4.8 oz (98.6 kg), last menstrual period 02/09/2024, not currently breastfeeding. Body mass index is 36.16 kg/m. Physical Exam Vitals and nursing note reviewed. Exam conducted with a chaperone present.  Constitutional:      Appearance: Normal appearance. She is obese.  HENT:     Head: Normocephalic and atraumatic.  Eyes:     Extraocular Movements: Extraocular movements intact.   Pulmonary:     Effort: Pulmonary effort is normal.  Abdominal:     General: Abdomen is flat.     Palpations: Abdomen is soft.  Genitourinary:    General: Normal vulva.     Vagina: No signs of injury. Tenderness (in cul-de-sac) present. No vaginal discharge, erythema, bleeding, lesions or prolapsed vaginal walls.     Cervix: Normal. No cervical motion tenderness, discharge, friability, lesion, erythema or cervical bleeding.     Uterus: Normal. Not enlarged, not tender and no uterine prolapse.      Adnexa: Right adnexa normal and left adnexa normal.     Comments: Pap test obtained. Musculoskeletal:        General: Normal range of motion.  Neurological:     General: No focal deficit present.     Mental Status: She is alert.  Psychiatric:        Mood and Affect: Mood normal.    Assessment/Plan:   1. Female dyspareunia   2. Pelvic pain   3. Menorrhagia with regular cycle   4. Painful menstrual periods   5. Cervical cancer screening    Kathryn Estrada has questions about potential endometriosis. We discussed this could be a possibility, especially in light of her HMB and dyschezia and it could also be cause for her pain with deep vaginal penetration. We reviewed treatment options for endometriosis and offered hormonal options first that will help her HMB. She is very uncertain about taking hormonal contraception. She may be interested in Mirena IUD, offered pre-procedural  medication; wants to think about it and discuss with her husband, will notify office if desires insertion. Advised she cannot have any unprotected intercourse for the 2wks prior to IUD. We also discussed possibility of pelvic floor spasms contributing to her dyspareunia and recommend she do a course of pelvic floor therapy to which she agrees. We lastly updated her pap smear today as she was due. Follow up as needed.   Total time was 35 minutes. That includes chart review before the visit, the actual patient visit, and time spent  on documentation after the visit. Time excludes procedures, if any.    Julieanne Manson, DO [facility]

## 2024-02-20 LAB — CULTURE, URINE COMPREHENSIVE

## 2024-02-23 ENCOUNTER — Ambulatory Visit (INDEPENDENT_AMBULATORY_CARE_PROVIDER_SITE_OTHER): Payer: Medicaid Other | Admitting: Obstetrics

## 2024-02-23 ENCOUNTER — Encounter: Payer: Self-pay | Admitting: Obstetrics

## 2024-02-23 ENCOUNTER — Other Ambulatory Visit (HOSPITAL_COMMUNITY)
Admission: RE | Admit: 2024-02-23 | Discharge: 2024-02-23 | Disposition: A | Discharge status: Home / Self Care | Source: Ambulatory Visit | Attending: Obstetrics | Admitting: Obstetrics

## 2024-02-23 VITALS — BP 122/88 | HR 87 | Ht 65.0 in | Wt 217.3 lb

## 2024-02-23 DIAGNOSIS — K5900 Constipation, unspecified: Secondary | ICD-10-CM

## 2024-02-23 DIAGNOSIS — R102 Pelvic and perineal pain: Secondary | ICD-10-CM

## 2024-02-23 DIAGNOSIS — Z124 Encounter for screening for malignant neoplasm of cervix: Secondary | ICD-10-CM

## 2024-02-23 DIAGNOSIS — N941 Unspecified dyspareunia: Secondary | ICD-10-CM

## 2024-02-23 DIAGNOSIS — N920 Excessive and frequent menstruation with regular cycle: Secondary | ICD-10-CM | POA: Diagnosis not present

## 2024-02-23 DIAGNOSIS — N946 Dysmenorrhea, unspecified: Secondary | ICD-10-CM

## 2024-02-26 ENCOUNTER — Encounter: Payer: Self-pay | Admitting: Obstetrics

## 2024-02-26 LAB — CYTOLOGY - PAP
Adequacy: ABSENT
Chlamydia: NEGATIVE
Comment: NEGATIVE
Comment: NORMAL
Diagnosis: NEGATIVE
Neisseria Gonorrhea: NEGATIVE

## 2024-03-15 NOTE — Progress Notes (Deleted)
 Cardiology Office Note    Date:  03/15/2024   ID:  Kathryn Estrada, DOB October 01, 1996, MRN [id number]  PCP:  Carlean Jews, PA-C  Cardiologist:  Debbe Odea, MD  Electrophysiologist:  None   Chief Complaint: Follow-up  History of Present Illness:   Kathryn Estrada is a 28 y.o. female with history of *** who presents for follow up of ***  Zio patch showed a predominant rhythm of sinus with an average rate of 93 bpm (range 57 bpm to 168 bpm), and rare atrial and ventricular ectopy.  No evidence of significant arrhythmias, prolonged pauses, or high-grade AV block.  Echo showed an EF of 55 to 60%, no regional wall motion normalities, normal LV diastolic function parameters, normal RV systolic function and ventricular cavity size, mild mitral regurgitation, and an estimated right atrial pressure of 3 mmHg.  Carotid artery ultrasound showed no evidence of stenosis in the bilateral ICAs with antegrade flow of the bilateral vertebral arteries and normal flow hemodynamics of the bilateral subclavian arteries.  ***   Labs independently reviewed: 11/2023 - magnesium 2.0, Hgb 13.1, PLT 335, BUN 14, serum creatinine 0.91, potassium 4.4 10/2023 -albumin 4.0, AST/ALT normal 06/2023 - TC 205, TG 180, HDL 52, LDL 132, TSH normal, A1c 5.3  Past Medical History:  Diagnosis Date   Anemia    Complication of anesthesia    takes alot of anesthetic for dental procedure to become numb   Elevated blood pressure reading    Family history of adverse reaction to anesthesia    takes alot of anesthetic at dentist to become numb   GERD (gastroesophageal reflux disease)    Raynaud's syndrome without gangrene     Past Surgical History:  Procedure Laterality Date   BIOPSY  12/10/2023   Procedure: BIOPSY;  Surgeon: Toney Reil, MD;  Location: [facility] ENDOSCOPY;  Service: Gastroenterology;;   CESAREAN SECTION N/A 04/03/2021   Procedure: CESAREAN SECTION;  Surgeon: Natale Milch, MD;  Location: [facility] ORS;  Service: Obstetrics;  Laterality: N/A;   CESAREAN SECTION N/A 04/24/2023   Procedure: REPEAT CESAREAN SECTION;  Surgeon: Hildred Laser, MD;  Location: [facility] ORS;  Service: Obstetrics;  Laterality: N/A;   ESOPHAGOGASTRODUODENOSCOPY (EGD) WITH PROPOFOL N/A 12/10/2023   Procedure: ESOPHAGOGASTRODUODENOSCOPY (EGD) WITH PROPOFOL;  Surgeon: Toney Reil, MD;  Location[facility] ENDOSCOPY;  Service: Gastroenterology;  Laterality: N/A;   WISDOM TOOTH EXTRACTION     four;- age 42, 87, 52, 62    Current Medications: No outpatient medications have been marked as taking for the 03/18/24 encounter (Appointment) with Sondra Barges, PA-C.    Allergies:   Patient has no known allergies.   Social History   Socioeconomic History   Marital status: Married    Spouse name: Kathryn Estrada   Number of children: 1   Years of education: 13.5   Highest education level: Not on file  Occupational History   Occupation: stay at home mom  Tobacco Use   Smoking status: Every Day    Types: E-cigarettes   Smokeless tobacco: Never   Tobacco comments:    Quit cigarettes 08-28-2020, now vapes   Vaping Use   Vaping status: Every Day   Last attempt to quit: 08/28/2020   Devices: vape   Substance and Sexual Activity   Alcohol use: Not Currently    Comment: ocassinally/ once a month    Drug use: Never   Sexual activity: Yes    Partners: Male    Birth control/protection: Condom  Other Topics Concern   Not on file  Social History Narrative   Not on file   Social Drivers of Health   Financial Resource Strain: Low Risk  (09/18/2022)   Overall Financial Resource Strain (CARDIA)    Difficulty of Paying Living Expenses: Not very hard  Food Insecurity: No Food Insecurity (04/24/2023)   Hunger Vital Sign    Worried About Running Out of Food in the Last Year: Never true    Ran Out of Food in the Last Year: Never true  Transportation Needs: No Transportation Needs (04/24/2023)   PRAPARE -  Administrator, Civil Service (Medical): No    Lack of Transportation (Non-Medical): No  Physical Activity: Inactive (09/18/2022)   Exercise Vital Sign    Days of Exercise per Week: 0 days    Minutes of Exercise per Session: 0 min  Stress: No Stress Concern Present (09/18/2022)   [employer] of Occupational Health - Occupational Stress Questionnaire    Feeling of Stress : Not at all  Social Connections: Moderately Integrated (09/18/2022)   Social Connection and Isolation Panel [NHANES]    Frequency of Communication with Friends and Family: More than three times a week    Frequency of Social Gatherings with Friends and Family: Three times a week    Attends Religious Services: More than 4 times per year    Active Member of Clubs or Organizations: No    Attends Banker Meetings: Never    Marital Status: Married     Family History:  The patient's family history includes Anemia in her mother; Cancer (age of onset: 24) in her paternal grandfather; Kidney disease in her paternal grandmother; Neurofibromatosis in her brother, brother, mother, and sister; Schizophrenia in her maternal grandfather.  ROS:   12-point review of systems is negative unless otherwise noted in the HPI.   EKGs/Labs/Other Studies Reviewed:    Studies reviewed were summarized above. The additional studies were reviewed today:  Zio patch 11/2023: Patient had a min HR of 57 bpm, max HR of 168 bpm, and avg HR of 93 bpm. Predominant underlying rhythm was Sinus Rhythm. Isolated SVEs were rare (<1.0%), and no SVE Couplets or SVE Triplets were present. Isolated VEs were rare (<1.0%), and no VE Couplets  or VE Triplets were present.    Conclusion Average heart rate 93, minimal heart rate 57, max heart rate 168. No atrial fibrillation or atrial flutter. Cardiac monitor showed no significant arrhythmias. __________  2D echo 01/15/2024: 1. Left ventricular ejection fraction, by estimation, is 55  to 60%. Left  ventricular ejection fraction by PLAX is 61 %. The left ventricle has  normal function. The left ventricle has no regional wall motion  abnormalities. Left ventricular diastolic  parameters were normal.   2. Right ventricular systolic function is normal. The right ventricular  size is normal. Tricuspid regurgitation signal is inadequate for assessing  PA pressure.   3. The mitral valve is normal in structure. Mild mitral valve  regurgitation. No evidence of mitral stenosis.   4. The aortic valve is normal in structure. Aortic valve regurgitation is  not visualized. No aortic stenosis is present.   5. The inferior vena cava is normal in size with greater than 50%  respiratory variability, suggesting right atrial pressure of 3 mmHg.  __________  Carotid artery ultrasound 01/15/2024: Summary:  Right Carotid: There was no evidence of thrombus, dissection,  atherosclerotic plaque or stenosis in the cervical carotid system.   Left Carotid:  There was no evidence of thrombus, dissection,  atherosclerotic plaque or stenosis in the cervical carotid system.   Vertebrals:  Bilateral vertebral arteries demonstrate antegrade flow.  Subclavians: Normal flow hemodynamics were seen in bilateral subclavian arteries.    EKG:  EKG is ordered today.  The EKG ordered today demonstrates ***  Recent Labs: 06/25/2023: TSH 1.230 10/24/2023: ALT 18 12/19/2023: BUN 14; Creatinine, Ser 0.91; Hemoglobin 13.1; Magnesium 2.0; Platelets 335; Potassium 4.4; Sodium 141  Recent Lipid Panel    Component Value Date/Time   CHOL 205 (H) 06/25/2023 1042   TRIG 118 06/25/2023 1042   HDL 52 06/25/2023 1042   LDLCALC 132 (H) 06/25/2023 1042    PHYSICAL EXAM:    VS:  There were no vitals taken for this visit.  BMI: There is no height or weight on file to calculate BMI.  Physical Exam  Wt Readings from Last 3 Encounters:  02/23/24 217 lb 4.8 oz (98.6 kg)  02/17/24 216 lb 6.4 oz (98.2 kg)  02/10/24  216 lb 4 oz (98.1 kg)     ASSESSMENT & PLAN:   ***   {Are you ordering a CV Procedure (e.g. stress test, cath, DCCV, TEE, etc)?   Press F2        :[id number]}     Disposition: F/u with Dr. Azucena Cecil or an APP in ***.   Medication Adjustments/Labs and Tests Ordered: Current medicines are reviewed at length with the patient today.  Concerns regarding medicines are outlined above. Medication changes, Labs and Tests ordered today are summarized above and listed in the Patient Instructions accessible in Encounters.   SignedEula Listen, PA-C 03/15/2024 10:24 AM     [facility] [place] [address] [place], [place] [address] [phone]

## 2024-03-18 ENCOUNTER — Ambulatory Visit: Payer: Medicaid Other | Attending: Physician Assistant | Admitting: Physician Assistant

## 2024-03-18 DIAGNOSIS — R42 Dizziness and giddiness: Secondary | ICD-10-CM

## 2024-04-05 ENCOUNTER — Encounter: Payer: Self-pay | Admitting: Physician Assistant

## 2024-04-05 ENCOUNTER — Ambulatory Visit: Admitting: Physician Assistant

## 2024-04-05 VITALS — BP 110/70 | HR 85 | Temp 97.8°F | Resp 16 | Ht 65.0 in | Wt 214.0 lb

## 2024-04-05 DIAGNOSIS — R0602 Shortness of breath: Secondary | ICD-10-CM | POA: Diagnosis not present

## 2024-04-05 DIAGNOSIS — R079 Chest pain, unspecified: Secondary | ICD-10-CM

## 2024-04-05 DIAGNOSIS — M542 Cervicalgia: Secondary | ICD-10-CM | POA: Diagnosis not present

## 2024-04-05 NOTE — Progress Notes (Signed)
 [facility] [address] [place], [place] [address]  Internal MEDICINE  Office Visit Note  Patient Name: Kathryn Estrada  [id number]  [id number]  Date of Service: 04/05/2024  Chief Complaint  Patient presents with   Acute Visit    Pain her glands    Chest Pain    For 2 months, stopped vaping 1 month ago     HPI Pt is here for a sick visit. -Was vaping for 9years, Quit on her bday. Is now 1 month out from quitting and has been having intermittent CP for 2 months (both sides and some in R shoulder). Also having some pain intermittent along neck too for 2 months. Can sometimes change position and it will go away, but not always. Can last up to . No palpitations or feeling overly anxious when episodes occur. Can happen at any time, with or without any exertion -Symptoms started prior to quitting, therefore not a change since quitting -Had seen cardiology in Dec and ordered zio, carotid US, and echo. Has not had follow up due to having stomach bud at appt time, but per chart all looked reassuring. EKG in office today similar to previous. -No fevers, or wheezing. Occasional SOB. No sore throat and reports pain in neck different from when feeling sick. Not tender to palpation. No ankle/leg swelling and no pain in legs -did try gaz X but did not make a difference -did use nicotine mints to help with quitting, weaning off these now, otherwise takes no medications now -Stopped zofran and headaches/dizziness now gone. Still nauseous at times. Following with GI  Current Medication:  Outpatient Encounter Medications as of 04/05/2024  Medication Sig   [DISCONTINUED] ondansetron (ZOFRAN-ODT) 4 MG disintegrating tablet TAKE 1 TABLET BY MOUTH EVERY 8 HOURS AS NEEDED FOR NAUSEA AND VOMITING   No facility-administered encounter medications on file as of 04/05/2024.      Medical History: Past Medical History:  Diagnosis Date   Anemia    Complication of anesthesia     takes alot of anesthetic for dental procedure to become numb   Elevated blood pressure reading    Family history of adverse reaction to anesthesia    takes alot of anesthetic at dentist to become numb   GERD (gastroesophageal reflux disease)    Raynaud's syndrome without gangrene      Vital Signs: BP 110/70   Pulse 85   Temp 97.8 F (36.6 C)   Resp 16   Ht 5\' 5"  (1.651 m)   Wt 214 lb (97.1 kg)   SpO2 95%   BMI 35.61 kg/m    Review of Systems  Constitutional:  Negative for fatigue and fever.  HENT:  Negative for congestion, dental problem, mouth sores, postnasal drip, sore throat and trouble swallowing.        Intermittent pain along bilateral sides of neck  Respiratory:  Positive for shortness of breath. Negative for cough and wheezing.        Intermittent SOB, not currently present in office  Cardiovascular:  Positive for chest pain. Negative for palpitations and leg swelling.       Intermittent CP, not currently present in office  Genitourinary:  Negative for flank pain.  Psychiatric/Behavioral: Negative.      Physical Exam Vitals and nursing note reviewed.  Constitutional:      General: She is not in acute distress.    Appearance: Normal appearance. She is not ill-appearing or diaphoretic.  HENT:     Head: Normocephalic  and atraumatic.  Neck:     Vascular: No carotid bruit.  Cardiovascular:     Rate and Rhythm: Normal rate and regular rhythm.  Pulmonary:     Effort: Pulmonary effort is normal.     Breath sounds: No wheezing or rhonchi.  Chest:     Chest wall: No tenderness.  Musculoskeletal:     Cervical back: No tenderness.     Right lower leg: No edema.     Left lower leg: No edema.  Skin:    General: Skin is warm and dry.  Neurological:     Mental Status: She is alert.  Psychiatric:        Mood and Affect: Mood normal.        Behavior: Behavior normal.       Assessment/Plan: 1. Intermittent chest pain (Primary) Unclear etiology, EKG  reassuring in office and pt without acute symptoms currently. Will check a chest xray, but advised pt to call cardiology for follow up to address previous testing and new symptoms. Advised to go to ED if any new or worsening symptoms - EKG 12-Lead - DG Chest 2 View; Future  2. SOB (shortness of breath) Intermittent, will check chest xray. Will follow up with cardiology, but may need CT - DG Chest 2 View; Future  3. Neck pain, bilateral Anterior-lateral intermittent pain without any sore throat, swelling, or limitation in ROM. Carotid US  previously done. No tenderness on exam. Will follow up with cardiology given symptom onset with intermittent CP. Will monitor. Go to ED for any new or worsening symptoms   General Counseling: selen smucker understanding of the findings of todays visit and agrees with plan of treatment. I have discussed any further diagnostic evaluation that may be needed or ordered today. We also reviewed her medications today. she has been encouraged to call the office with any questions or concerns that should arise related to todays visit.    Counseling:    Orders Placed This Encounter  Procedures   DG Chest 2 View   EKG 12-Lead    No orders of the defined types were placed in this encounter.   Time spent:35 Minutes

## 2024-04-19 ENCOUNTER — Ambulatory Visit (INDEPENDENT_AMBULATORY_CARE_PROVIDER_SITE_OTHER): Admitting: Physician Assistant

## 2024-04-19 VITALS — BP 120/80 | HR 95 | Temp 97.8°F | Resp 16 | Ht 65.0 in | Wt 212.0 lb

## 2024-04-19 DIAGNOSIS — Z7689 Persons encountering health services in other specified circumstances: Secondary | ICD-10-CM

## 2024-04-19 DIAGNOSIS — E669 Obesity, unspecified: Secondary | ICD-10-CM | POA: Diagnosis not present

## 2024-04-19 MED ORDER — TIRZEPATIDE-WEIGHT MANAGEMENT 2.5 MG/0.5ML ~~LOC~~ SOLN: 2.5000 mg | SUBCUTANEOUS | 0 refills | Status: DC

## 2024-04-19 NOTE — Progress Notes (Signed)
 [facility] [address] [place], [place] [address]  Internal MEDICINE  Office Visit Note  Patient Name: Kathryn Estrada  [id number]  [id number]  Date of Service: 04/19/2024  Chief Complaint  Patient presents with   Follow-up    Weight loss     HPI Pt is here for routine follow up to discuss wt loss  -recently discovered GLP1 for wt loss may be covered by insurance and is interested in trying this -had tried ozempic briefly paying OOP at a med spa, but only went to .5 mg dose and did not see results/was not affordable  -has tried phentermine  and wellbutrin  previously. Had S/E with phentermine  with palpitations, and given recent intermittent CP would not recommend retrying -No personal or Fhx of thyroid CA. Mom is on ozempic for her sugar control and tolerates well. States he sister may be starting wegovy soon. She is interested in trying zepbound as an alternative since she did try semaglutide briefly. -states intermittent CP/neck pain better now. No episodes now. Thinks it may have been stress/then stopping vaping.   Current Medication: Outpatient Encounter Medications as of 04/19/2024  Medication Sig   tirzepatide (ZEPBOUND) 2.5 MG/0.5ML injection vial Inject 2.5 mg into the skin once a week.   No facility-administered encounter medications on file as of 04/19/2024.    Surgical History: Past Surgical History:  Procedure Laterality Date   BIOPSY  12/10/2023   Procedure: BIOPSY;  Surgeon: Selena Daily, MD;  Location: [facility] ENDOSCOPY;  Service: Gastroenterology;;   CESAREAN SECTION N/A 04/03/2021   Procedure: CESAREAN SECTION;  Surgeon: Heron Lord, MD;  Location: [facility] ORS;  Service: Obstetrics;  Laterality: N/A;   CESAREAN SECTION N/A 04/24/2023   Procedure: REPEAT CESAREAN SECTION;  Surgeon: Teresa Fender, MD;  Location: [facility] ORS;  Service: Obstetrics;  Laterality: N/A;   ESOPHAGOGASTRODUODENOSCOPY (EGD) WITH PROPOFOL  N/A 12/10/2023    Procedure: ESOPHAGOGASTRODUODENOSCOPY (EGD) WITH PROPOFOL ;  Surgeon: Selena Daily, MD;  Location: [facility] ENDOSCOPY;  Service: Gastroenterology;  Laterality: N/A;   WISDOM TOOTH EXTRACTION     four;- age 28, 66, 29, 27    Medical History: Past Medical History:  Diagnosis Date   Anemia    Complication of anesthesia    takes alot of anesthetic for dental procedure to become numb   Elevated blood pressure reading    Family history of adverse reaction to anesthesia    takes alot of anesthetic at dentist to become numb   GERD (gastroesophageal reflux disease)    Raynaud's syndrome without gangrene     Family History: Family History  Problem Relation Age of Onset   Neurofibromatosis Mother    Anemia Mother    Neurofibromatosis Sister    Neurofibromatosis Brother    Neurofibromatosis Brother    Schizophrenia Maternal Grandfather    Kidney disease Paternal Grandmother    Cancer Paternal Grandfather 12       colon    Social History   Socioeconomic History   Marital status: Married    Spouse name: Herminia Lope   Number of children: 1   Years of education: 13.5   Highest education level: Not on file  Occupational History   Occupation: stay at home mom  Tobacco Use   Smoking status: Former    Types: E-cigarettes   Smokeless tobacco: Never   Tobacco comments:    Quit cigarettes 08-28-2020, now vape stopped 03/02/2024  Vaping Use   Vaping status: Every Day   Last attempt to quit: 08/28/2020  Devices: vape   Substance and Sexual Activity   Alcohol use: Not Currently    Comment: ocassinally/ once a month    Drug use: Never   Sexual activity: Yes    Partners: Male    Birth control/protection: Condom  Other Topics Concern   Not on file  Social History Narrative   Not on file   Social Drivers of Health   Financial Resource Strain: Low Risk  (09/18/2022)   Overall Financial Resource Strain (CARDIA)    Difficulty of Paying Living Expenses: Not very hard  Food Insecurity: No  Food Insecurity (04/24/2023)   Hunger Vital Sign    Worried About Running Out of Food in the Last Year: Never true    Ran Out of Food in the Last Year: Never true  Transportation Needs: No Transportation Needs (04/24/2023)   PRAPARE - Administrator, Civil Service (Medical): No    Lack of Transportation (Non-Medical): No  Physical Activity: Inactive (09/18/2022)   Exercise Vital Sign    Days of Exercise per Week: 0 days    Minutes of Exercise per Session: 0 min  Stress: No Stress Concern Present (09/18/2022)   [employer] of Occupational Health - Occupational Stress Questionnaire    Feeling of Stress : Not at all  Social Connections: Moderately Integrated (09/18/2022)   Social Connection and Isolation Panel [NHANES]    Frequency of Communication with Friends and Family: More than three times a week    Frequency of Social Gatherings with Friends and Family: Three times a week    Attends Religious Services: More than 4 times per year    Active Member of Clubs or Organizations: No    Attends Banker Meetings: Never    Marital Status: Married  Catering manager Violence: Not At Risk (04/24/2023)   Humiliation, Afraid, Rape, and Kick questionnaire    Fear of Current or Ex-Partner: No    Emotionally Abused: No    Physically Abused: No    Sexually Abused: No      Review of Systems  Constitutional:  Positive for fatigue. Negative for chills and unexpected weight change.  HENT:  Positive for postnasal drip. Negative for congestion, rhinorrhea, sneezing and sore throat.   Respiratory:  Negative for cough, chest tightness and shortness of breath.   Cardiovascular:  Negative for chest pain and palpitations.  Gastrointestinal:  Negative for abdominal pain, constipation, diarrhea and nausea.  Genitourinary:  Negative for dysuria.  Skin:  Negative for rash.  Neurological: Negative.   Psychiatric/Behavioral:  Negative for behavioral problems (Depression), sleep  disturbance and suicidal ideas. The patient is not nervous/anxious.     Vital Signs: BP 120/80   Pulse 95   Temp 97.8 F (36.6 C)   Resp 16   Ht 5\' 5"  (1.651 m)   Wt 212 lb (96.2 kg)   SpO2 97%   BMI 35.28 kg/m    Physical Exam Vitals and nursing note reviewed.  Constitutional:      General: She is not in acute distress.    Appearance: Normal appearance. She is obese. She is not ill-appearing or diaphoretic.  HENT:     Head: Normocephalic and atraumatic.  Eyes:     Extraocular Movements: Extraocular movements intact.  Neck:     Vascular: No carotid bruit.  Cardiovascular:     Rate and Rhythm: Normal rate and regular rhythm.  Pulmonary:     Effort: Pulmonary effort is normal.     Breath sounds: No  wheezing or rhonchi.  Chest:     Chest wall: No tenderness.  Musculoskeletal:     Cervical back: No tenderness.  Skin:    General: Skin is warm and dry.  Neurological:     Mental Status: She is alert.  Psychiatric:        Mood and Affect: Mood normal.        Behavior: Behavior normal.        Assessment/Plan: 1. Encounter for weight management (Primary) Discussed medication options and would like to move forward with zepbound. Aware of possible PA requirement. Will titrate as needed. Will continue to work on diet and exercise as well.  2. Obesity (BMI 30-39.9) - tirzepatide (ZEPBOUND) 2.5 MG/0.5ML injection vial; Inject 2.5 mg into the skin once a week.  Dispense: 2 mL; Refill: 0   General Counseling: Pilar verbalizes understanding of the findings of todays visit and agrees with plan of treatment. I have discussed any further diagnostic evaluation that may be needed or ordered today. We also reviewed her medications today. she has been encouraged to call the office with any questions or concerns that should arise related to todays visit.    No orders of the defined types were placed in this encounter.   Meds ordered this encounter  Medications   tirzepatide  (ZEPBOUND) 2.5 MG/0.5ML injection vial    Sig: Inject 2.5 mg into the skin once a week.    Dispense:  2 mL    Refill:  0    This patient was seen by Taylor Favia, PA-C in collaboration with Dr. Verneta Gone as a part of collaborative care agreement.   Total time spent:30 Minutes Time spent includes review of chart, medications, test results, and follow up plan with the patient.      Dr Fozia M Khan Internal medicine

## 2024-05-14 ENCOUNTER — Other Ambulatory Visit: Payer: Self-pay | Admitting: Gastroenterology

## 2024-05-14 ENCOUNTER — Telehealth: Payer: Self-pay

## 2024-05-14 DIAGNOSIS — R110 Nausea: Secondary | ICD-10-CM

## 2024-05-14 NOTE — Telephone Encounter (Signed)
Completed P.A. for patient's Zepbound. 

## 2024-05-18 ENCOUNTER — Telehealth: Payer: Self-pay

## 2024-05-18 NOTE — Telephone Encounter (Signed)
 Submitted appeal to [facility], may take up to 30 days for a decision.

## 2024-06-04 ENCOUNTER — Ambulatory Visit: Admitting: Physician Assistant

## 2024-06-08 ENCOUNTER — Other Ambulatory Visit: Payer: Self-pay | Admitting: Physician Assistant

## 2024-06-08 ENCOUNTER — Other Ambulatory Visit: Payer: Self-pay

## 2024-06-08 DIAGNOSIS — E669 Obesity, unspecified: Secondary | ICD-10-CM

## 2024-06-08 MED ORDER — WEGOVY 0.25 MG/0.5ML ~~LOC~~ SOAJ: 0.2500 mg | SUBCUTANEOUS | 2 refills | Status: DC

## 2024-06-08 NOTE — Telephone Encounter (Signed)
 Patient denied Zepbound , Lauren sent [phone] instead.

## 2024-06-09 ENCOUNTER — Telehealth: Payer: Self-pay

## 2024-06-09 NOTE — Telephone Encounter (Signed)
 Patient approved for [facility]. Rescheduled patient's follow-up appt so that way medication has time to get into her system.

## 2024-06-09 NOTE — Telephone Encounter (Signed)
Completed P.A. for patient's ZOXWRU.

## 2024-06-14 ENCOUNTER — Ambulatory Visit: Admitting: Physician Assistant

## 2024-06-28 ENCOUNTER — Encounter: Payer: Self-pay | Admitting: Physician Assistant

## 2024-06-28 ENCOUNTER — Ambulatory Visit (INDEPENDENT_AMBULATORY_CARE_PROVIDER_SITE_OTHER): Admitting: Physician Assistant

## 2024-06-28 VITALS — BP 130/82 | HR 87 | Temp 98.3°F | Resp 16 | Ht 65.0 in | Wt 215.6 lb

## 2024-06-28 DIAGNOSIS — E669 Obesity, unspecified: Secondary | ICD-10-CM

## 2024-06-28 DIAGNOSIS — Z7689 Persons encountering health services in other specified circumstances: Secondary | ICD-10-CM | POA: Diagnosis not present

## 2024-06-28 MED ORDER — SEMAGLUTIDE-WEIGHT MANAGEMENT 0.5 MG/0.5ML ~~LOC~~ SOAJ: 0.5000 mg | SUBCUTANEOUS | 0 refills | Status: DC

## 2024-06-28 NOTE — Progress Notes (Signed)
 [facility] [address] [place], [place] [address]  Internal MEDICINE  Office Visit Note  Patient Name: Kathryn Estrada  [id number]  [id number]  Date of Service: 06/28/2024  Chief Complaint  Patient presents with   Gastroesophageal Reflux   Follow-up    Weight loss     HPI Pt is here for routine follow up for weight management -doing well with wegovy , but has not seen weight loss results. No S/E. Has only taken 2 doses so far, she takes 3rd today. Would like to plan to go up on dose at end of 4 weeks and will send the .5 mg dose -Walking nightly and sometimes in AM on treadmill when able -will get in pool to play with kids, will try to incorporate more movement/exercise in water too  -Still no vaping, it has been almost 4 months since quitting!  Current Medication: Outpatient Encounter Medications as of 06/28/2024  Medication Sig   Semaglutide -Weight Management 0.5 MG/0.5ML SOAJ Inject 0.5 mg into the skin once a week.   [DISCONTINUED] omeprazole  (PRILOSEC) 20 MG capsule TAKE 1 CAPSULE BY MOUTH DAILY BEFORE BREAKFAST.   [DISCONTINUED] Semaglutide -Weight Management (WEGOVY ) 0.25 MG/0.5ML SOAJ Inject 0.25 mg into the skin once a week.   No facility-administered encounter medications on file as of 06/28/2024.    Surgical History: Past Surgical History:  Procedure Laterality Date   BIOPSY  12/10/2023   Procedure: BIOPSY;  Surgeon: Unk Corinn Skiff, MD;  Location: [facility] ENDOSCOPY;  Service: Gastroenterology;;   CESAREAN SECTION N/A 04/03/2021   Procedure: CESAREAN SECTION;  Surgeon: Victor Claudell SAUNDERS, MD;  Location: [facility] ORS;  Service: Obstetrics;  Laterality: N/A;   CESAREAN SECTION N/A 04/24/2023   Procedure: REPEAT CESAREAN SECTION;  Surgeon: Connell Davies, MD;  Location: [facility] ORS;  Service: Obstetrics;  Laterality: N/A;   ESOPHAGOGASTRODUODENOSCOPY (EGD) WITH PROPOFOL  N/A 12/10/2023   Procedure: ESOPHAGOGASTRODUODENOSCOPY (EGD) WITH PROPOFOL ;  Surgeon:  Unk Corinn Skiff, MD;  Location: [facility] ENDOSCOPY;  Service: Gastroenterology;  Laterality: N/A;   WISDOM TOOTH EXTRACTION     four;- age 65, 23, 87, 15    Medical History: Past Medical History:  Diagnosis Date   Anemia    Complication of anesthesia    takes alot of anesthetic for dental procedure to become numb   Elevated blood pressure reading    Family history of adverse reaction to anesthesia    takes alot of anesthetic at dentist to become numb   GERD (gastroesophageal reflux disease)    Raynaud's syndrome without gangrene     Family History: Family History  Problem Relation Age of Onset   Neurofibromatosis Mother    Anemia Mother    Neurofibromatosis Sister    Neurofibromatosis Brother    Neurofibromatosis Brother    Schizophrenia Maternal Grandfather    Kidney disease Paternal Grandmother    Cancer Paternal Grandfather 52       colon    Social History   Socioeconomic History   Marital status: Married    Spouse name: Norman   Number of children: 1   Years of education: 13.5   Highest education level: Not on file  Occupational History   Occupation: stay at home mom  Tobacco Use   Smoking status: Former    Types: E-cigarettes   Smokeless tobacco: Never   Tobacco comments:    Quit cigarettes 08-28-2020, now vape stopped 03/02/2024  Vaping Use   Vaping status: Every Day   Last attempt to quit: 08/28/2020   Devices: vape  Substance and Sexual Activity   Alcohol use: Not Currently    Comment: ocassinally/ once a month    Drug use: Never   Sexual activity: Yes    Partners: Male    Birth control/protection: Condom  Other Topics Concern   Not on file  Social History Narrative   Not on file   Social Drivers of Health   Financial Resource Strain: Low Risk  (09/18/2022)   Overall Financial Resource Strain (CARDIA)    Difficulty of Paying Living Expenses: Not very hard  Food Insecurity: No Food Insecurity (04/24/2023)   Hunger Vital Sign    Worried About  Running Out of Food in the Last Year: Never true    Ran Out of Food in the Last Year: Never true  Transportation Needs: No Transportation Needs (04/24/2023)   PRAPARE - Administrator, Civil Service (Medical): No    Lack of Transportation (Non-Medical): No  Physical Activity: Inactive (09/18/2022)   Exercise Vital Sign    Days of Exercise per Week: 0 days    Minutes of Exercise per Session: 0 min  Stress: No Stress Concern Present (09/18/2022)   [employer] of Occupational Health - Occupational Stress Questionnaire    Feeling of Stress : Not at all  Social Connections: Moderately Integrated (09/18/2022)   Social Connection and Isolation Panel    Frequency of Communication with Friends and Family: More than three times a week    Frequency of Social Gatherings with Friends and Family: Three times a week    Attends Religious Services: More than 4 times per year    Active Member of Clubs or Organizations: No    Attends Banker Meetings: Never    Marital Status: Married  Catering manager Violence: Not At Risk (04/24/2023)   Humiliation, Afraid, Rape, and Kick questionnaire    Fear of Current or Ex-Partner: No    Emotionally Abused: No    Physically Abused: No    Sexually Abused: No      Review of Systems  Constitutional:  Positive for fatigue. Negative for chills and unexpected weight change.  HENT:  Negative for congestion, rhinorrhea, sneezing and sore throat.   Respiratory:  Negative for cough, chest tightness and shortness of breath.   Cardiovascular:  Negative for chest pain and palpitations.  Gastrointestinal:  Negative for abdominal pain, constipation, diarrhea and nausea.  Genitourinary:  Negative for dysuria.  Skin:  Negative for rash.  Neurological: Negative.   Psychiatric/Behavioral:  Negative for behavioral problems (Depression), sleep disturbance and suicidal ideas. The patient is not nervous/anxious.     Vital Signs: BP 130/82   Pulse 87    Temp 98.3 F (36.8 C)   Resp 16   Ht 5' 5 (1.651 m)   Wt 215 lb 9.6 oz (97.8 kg)   SpO2 98%   BMI 35.88 kg/m    Physical Exam Vitals and nursing note reviewed.  Constitutional:      General: She is not in acute distress.    Appearance: Normal appearance. She is obese. She is not ill-appearing or diaphoretic.  HENT:     Head: Normocephalic and atraumatic.  Eyes:     Extraocular Movements: Extraocular movements intact.  Cardiovascular:     Rate and Rhythm: Normal rate and regular rhythm.  Pulmonary:     Effort: Pulmonary effort is normal.     Breath sounds: No wheezing or rhonchi.  Chest:     Chest wall: No tenderness.  Skin:  General: Skin is warm and dry.  Neurological:     Mental Status: She is alert.  Psychiatric:        Mood and Affect: Mood normal.        Behavior: Behavior normal.        Assessment/Plan: 1. Encounter for weight management (Primary) Will plan to increase to .5mg  weekly dose once current doses completed. Continue to work on diet and exercise - Semaglutide -Weight Management 0.5 MG/0.5ML SOAJ; Inject 0.5 mg into the skin once a week.  Dispense: 2 mL; Refill: 0  2. Obesity (BMI 30-39.9) - Semaglutide -Weight Management 0.5 MG/0.5ML SOAJ; Inject 0.5 mg into the skin once a week.  Dispense: 2 mL; Refill: 0   General Counseling: Leola verbalizes understanding of the findings of todays visit and agrees with plan of treatment. I have discussed any further diagnostic evaluation that may be needed or ordered today. We also reviewed her medications today. she has been encouraged to call the office with any questions or concerns that should arise related to todays visit.    No orders of the defined types were placed in this encounter.   Meds ordered this encounter  Medications   Semaglutide -Weight Management 0.5 MG/0.5ML SOAJ    Sig: Inject 0.5 mg into the skin once a week.    Dispense:  2 mL    Refill:  0    This patient was seen by Tinnie Pro, PA-C in collaboration with Dr. Sigrid Bathe as a part of collaborative care agreement.   Total time spent:25 Minutes Time spent includes review of chart, medications, test results, and follow up plan with the patient.      Dr Fozia M Khan Internal medicine

## 2024-08-05 ENCOUNTER — Ambulatory Visit (INDEPENDENT_AMBULATORY_CARE_PROVIDER_SITE_OTHER): Admitting: Physician Assistant

## 2024-08-05 ENCOUNTER — Encounter: Payer: Self-pay | Admitting: Physician Assistant

## 2024-08-05 VITALS — BP 112/70 | HR 64 | Temp 98.1°F | Resp 16 | Ht 65.0 in | Wt 213.0 lb

## 2024-08-05 DIAGNOSIS — K649 Unspecified hemorrhoids: Secondary | ICD-10-CM

## 2024-08-05 DIAGNOSIS — E559 Vitamin D deficiency, unspecified: Secondary | ICD-10-CM

## 2024-08-05 DIAGNOSIS — E538 Deficiency of other specified B group vitamins: Secondary | ICD-10-CM

## 2024-08-05 DIAGNOSIS — Z1329 Encounter for screening for other suspected endocrine disorder: Secondary | ICD-10-CM

## 2024-08-05 DIAGNOSIS — E782 Mixed hyperlipidemia: Secondary | ICD-10-CM

## 2024-08-05 DIAGNOSIS — E669 Obesity, unspecified: Secondary | ICD-10-CM | POA: Diagnosis not present

## 2024-08-05 DIAGNOSIS — R5383 Other fatigue: Secondary | ICD-10-CM

## 2024-08-05 MED ORDER — SEMAGLUTIDE-WEIGHT MANAGEMENT 1 MG/0.5ML ~~LOC~~ SOAJ: 1.0000 mg | SUBCUTANEOUS | 2 refills | Status: DC

## 2024-08-05 NOTE — Progress Notes (Signed)
 [facility] [address] [place], [place] [address]  Internal MEDICINE  Office Visit Note  Patient Name: Kathryn Estrada  [id number]  [id number]  Date of Service: 08/05/2024  Chief Complaint  Patient presents with   Follow-up   Gastroesophageal Reflux   Weight Loss   Medication Management    Nausea med for semaglutide     HPI Pt is here for routine follow up -Down 2lbs since last visit -nausea the first day after injection, this disappears within first few hours and is tolerable. No other GI S/E -would like to go up on dose to 1mg  weekly -Going to the beach Sat for a week with the family -does have some increased hair shedding and is unsure if related to injection or if just cycling like it has been past few years since having her kids. Will have periods of growth and then shedding.  -Due for labs and will check thyroid to ensure this isnt contributing -does have an external hemorrhoid that will bleed with BM, no blood in DM. Hx of constipation with pregnancy and this developed. Torn whether to have this addressed now or wait until done having kids and she may want another kid in 2 years. Discussed possible GI follow up to address and using metamucil and miralax  as needed  Current Medication: Outpatient Encounter Medications as of 08/05/2024  Medication Sig   semaglutide -weight management (WEGOVY ) 1 MG/0.5ML SOAJ SQ injection Inject 1 mg into the skin once a week.   [DISCONTINUED] Semaglutide -Weight Management 0.5 MG/0.5ML SOAJ Inject 0.5 mg into the skin once a week.   No facility-administered encounter medications on file as of 08/05/2024.    Surgical History: Past Surgical History:  Procedure Laterality Date   BIOPSY  12/10/2023   Procedure: BIOPSY;  Surgeon: Unk Corinn Skiff, MD;  Location: [facility] ENDOSCOPY;  Service: Gastroenterology;;   CESAREAN SECTION N/A 04/03/2021   Procedure: CESAREAN SECTION;  Surgeon: Victor Claudell SAUNDERS, MD;  Location:  [facility] ORS;  Service: Obstetrics;  Laterality: N/A;   CESAREAN SECTION N/A 04/24/2023   Procedure: REPEAT CESAREAN SECTION;  Surgeon: Connell Davies, MD;  Location: [facility] ORS;  Service: Obstetrics;  Laterality: N/A;   ESOPHAGOGASTRODUODENOSCOPY (EGD) WITH PROPOFOL  N/A 12/10/2023   Procedure: ESOPHAGOGASTRODUODENOSCOPY (EGD) WITH PROPOFOL ;  Surgeon: Unk Corinn Skiff, MD;  Location: [facility] ENDOSCOPY;  Service: Gastroenterology;  Laterality: N/A;   WISDOM TOOTH EXTRACTION     four;- age 43, 12, 22, 76    Medical History: Past Medical History:  Diagnosis Date   Anemia    Complication of anesthesia    takes alot of anesthetic for dental procedure to become numb   Elevated blood pressure reading    Family history of adverse reaction to anesthesia    takes alot of anesthetic at dentist to become numb   GERD (gastroesophageal reflux disease)    Raynaud's syndrome without gangrene     Family History: Family History  Problem Relation Age of Onset   Neurofibromatosis Mother    Anemia Mother    Neurofibromatosis Sister    Neurofibromatosis Brother    Neurofibromatosis Brother    Schizophrenia Maternal Grandfather    Kidney disease Paternal Grandmother    Cancer Paternal Grandfather 42       colon    Social History   Socioeconomic History   Marital status: Married    Spouse name: Norman   Number of children: 1   Years of education: 13.5   Highest education level: Not on file  Occupational History  Occupation: stay at home mom  Tobacco Use   Smoking status: Former    Types: E-cigarettes   Smokeless tobacco: Never   Tobacco comments:    Quit cigarettes 08-28-2020, now vape stopped 03/02/2024  Vaping Use   Vaping status: Every Day   Last attempt to quit: 08/28/2020   Devices: vape   Substance and Sexual Activity   Alcohol use: Not Currently    Comment: ocassinally/ once a month    Drug use: Never   Sexual activity: Yes    Partners: Male    Birth control/protection: Condom   Other Topics Concern   Not on file  Social History Narrative   Not on file   Social Drivers of Health   Financial Resource Strain: Low Risk  (09/18/2022)   Overall Financial Resource Strain (CARDIA)    Difficulty of Paying Living Expenses: Not very hard  Food Insecurity: No Food Insecurity (04/24/2023)   Hunger Vital Sign    Worried About Running Out of Food in the Last Year: Never true    Ran Out of Food in the Last Year: Never true  Transportation Needs: No Transportation Needs (04/24/2023)   PRAPARE - Administrator, Civil Service (Medical): No    Lack of Transportation (Non-Medical): No  Physical Activity: Inactive (09/18/2022)   Exercise Vital Sign    Days of Exercise per Week: 0 days    Minutes of Exercise per Session: 0 min  Stress: No Stress Concern Present (09/18/2022)   [employer] of Occupational Health - Occupational Stress Questionnaire    Feeling of Stress : Not at all  Social Connections: Moderately Integrated (09/18/2022)   Social Connection and Isolation Panel    Frequency of Communication with Friends and Family: More than three times a week    Frequency of Social Gatherings with Friends and Family: Three times a week    Attends Religious Services: More than 4 times per year    Active Member of Clubs or Organizations: No    Attends Banker Meetings: Never    Marital Status: Married  Catering manager Violence: Not At Risk (04/24/2023)   Humiliation, Afraid, Rape, and Kick questionnaire    Fear of Current or Ex-Partner: No    Emotionally Abused: No    Physically Abused: No    Sexually Abused: No      Review of Systems  Constitutional:  Positive for fatigue. Negative for chills and unexpected weight change.  HENT:  Negative for congestion, rhinorrhea, sneezing and sore throat.   Respiratory:  Negative for cough, chest tightness and shortness of breath.   Cardiovascular:  Negative for chest pain and palpitations.  Gastrointestinal:   Positive for nausea. Negative for abdominal pain and diarrhea.       Hemorrhoid  Genitourinary:  Negative for dysuria.  Skin:  Negative for rash.  Neurological: Negative.   Psychiatric/Behavioral:  Negative for behavioral problems (Depression), sleep disturbance and suicidal ideas. The patient is not nervous/anxious.     Vital Signs: BP 112/70   Pulse 64   Temp 98.1 F (36.7 C)   Resp 16   Ht 5' 5 (1.651 m)   Wt 213 lb (96.6 kg)   SpO2 96%   BMI 35.45 kg/m    Physical Exam Vitals and nursing note reviewed.  Constitutional:      General: She is not in acute distress.    Appearance: Normal appearance. She is obese. She is not ill-appearing or diaphoretic.  HENT:  Head: Normocephalic and atraumatic.  Eyes:     Extraocular Movements: Extraocular movements intact.  Cardiovascular:     Rate and Rhythm: Normal rate and regular rhythm.  Pulmonary:     Effort: Pulmonary effort is normal.     Breath sounds: No wheezing or rhonchi.  Chest:     Chest wall: No tenderness.  Skin:    General: Skin is warm and dry.  Neurological:     Mental Status: She is alert.  Psychiatric:        Mood and Affect: Mood normal.        Behavior: Behavior normal.        Assessment/Plan: 1. Hemorrhoids, unspecified hemorrhoid type (Primary) Discussed metamucil/miralax  to help constipation and using prepH as needed. May consider scheduling GI follow up to discuss options  2. Obesity (BMI 30-39.9) Will increase to 1mg  weekly wegovy  and continue to work on diet and exercise - semaglutide -weight management (WEGOVY ) 1 MG/0.5ML SOAJ SQ injection; Inject 1 mg into the skin once a week.  Dispense: 2 mL; Refill: 2  3. Vitamin D  deficiency - VITAMIN D  25 Hydroxy (Vit-D Deficiency, Fractures)  4. B12 deficiency - B12 and Folate Panel  5. Thyroid disorder screening - TSH + free T4  6. Mixed hyperlipidemia - Lipid Panel With LDL/HDL Ratio  7. Other fatigue - CBC w/Diff/Platelet -  Comprehensive metabolic panel with GFR - TSH + free T4 - Lipid Panel With LDL/HDL Ratio - B12 and Folate Panel - Fe+TIBC+Fer - VITAMIN D  25 Hydroxy (Vit-D Deficiency, Fractures)   General Counseling: Ronnald oakland understanding of the findings of todays visit and agrees with plan of treatment. I have discussed any further diagnostic evaluation that may be needed or ordered today. We also reviewed her medications today. she has been encouraged to call the office with any questions or concerns that should arise related to todays visit.    Orders Placed This Encounter  Procedures   CBC w/Diff/Platelet   Comprehensive metabolic panel with GFR   TSH + free T4   Lipid Panel With LDL/HDL Ratio   B12 and Folate Panel   Fe+TIBC+Fer   VITAMIN D  25 Hydroxy (Vit-D Deficiency, Fractures)    Meds ordered this encounter  Medications   semaglutide -weight management (WEGOVY ) 1 MG/0.5ML SOAJ SQ injection    Sig: Inject 1 mg into the skin once a week.    Dispense:  2 mL    Refill:  2    This patient was seen by Tinnie Pro, PA-C in collaboration with Dr. Sigrid Bathe as a part of collaborative care agreement.   Total time spent:30 Minutes Time spent includes review of chart, medications, test results, and follow up plan with the patient.      Dr Fozia M Khan Internal medicine

## 2024-09-03 ENCOUNTER — Ambulatory Visit: Admitting: Physician Assistant

## 2024-09-08 ENCOUNTER — Other Ambulatory Visit: Payer: Self-pay | Admitting: Nurse Practitioner

## 2024-09-08 DIAGNOSIS — R110 Nausea: Secondary | ICD-10-CM

## 2024-09-16 ENCOUNTER — Encounter: Payer: Self-pay | Admitting: Physician Assistant

## 2024-09-16 ENCOUNTER — Ambulatory Visit: Admitting: Physician Assistant

## 2024-09-16 VITALS — BP 130/80 | HR 102 | Temp 97.8°F | Resp 16 | Ht 65.0 in | Wt 214.0 lb

## 2024-09-16 DIAGNOSIS — R21 Rash and other nonspecific skin eruption: Secondary | ICD-10-CM | POA: Diagnosis not present

## 2024-09-16 DIAGNOSIS — E669 Obesity, unspecified: Secondary | ICD-10-CM

## 2024-09-16 DIAGNOSIS — R110 Nausea: Secondary | ICD-10-CM | POA: Diagnosis not present

## 2024-09-16 MED ORDER — WEGOVY 1.7 MG/0.75ML ~~LOC~~ SOAJ: 1.7000 mg | SUBCUTANEOUS | 0 refills | Status: DC

## 2024-09-16 MED ORDER — ONDANSETRON 4 MG PO TBDP: 4.0000 mg | ORAL_TABLET | Freq: Three times a day (TID) | ORAL | 0 refills | Status: DC | PRN

## 2024-09-16 NOTE — Progress Notes (Signed)
 [facility] [address] [place], [place] [address]  Internal MEDICINE  Office Visit Note  Patient Name: Kathryn Estrada  [id number]  [id number]  Date of Service: 09/16/2024  Chief Complaint  Patient presents with   Follow-up    Labs and weight management     HPI Pt is here for routine follow up -unable to get labs and will do so now -First day of injection feels the suppression, but then doesn't feel suppression the rest of the time. Would like to  -gets nausea only with menstrual cycles not with injection -itchy rash sporadically on legs and arms. Reports buying new land and being outside and may have gotten into poison ivy. Has not tried anything for it and will start with hydrocortisone  cream  Current Medication: Outpatient Encounter Medications as of 09/16/2024  Medication Sig   ondansetron  (ZOFRAN -ODT) 4 MG disintegrating tablet Take 1 tablet (4 mg total) by mouth every 8 (eight) hours as needed for nausea or vomiting.   semaglutide -weight management (WEGOVY ) 1.7 MG/0.75ML SOAJ SQ injection Inject 1.7 mg into the skin once a week.   [DISCONTINUED] semaglutide -weight management (WEGOVY ) 1 MG/0.5ML SOAJ SQ injection Inject 1 mg into the skin once a week.   No facility-administered encounter medications on file as of 09/16/2024.    Surgical History: Past Surgical History:  Procedure Laterality Date   BIOPSY  12/10/2023   Procedure: BIOPSY;  Surgeon: Unk Corinn Skiff, MD;  Location: [facility] ENDOSCOPY;  Service: Gastroenterology;;   CESAREAN SECTION N/A 04/03/2021   Procedure: CESAREAN SECTION;  Surgeon: Victor Claudell SAUNDERS, MD;  Location: [facility] ORS;  Service: Obstetrics;  Laterality: N/A;   CESAREAN SECTION N/A 04/24/2023   Procedure: REPEAT CESAREAN SECTION;  Surgeon: Connell Davies, MD;  Location: [facility] ORS;  Service: Obstetrics;  Laterality: N/A;   ESOPHAGOGASTRODUODENOSCOPY (EGD) WITH PROPOFOL  N/A 12/10/2023   Procedure: ESOPHAGOGASTRODUODENOSCOPY  (EGD) WITH PROPOFOL ;  Surgeon: Unk Corinn Skiff, MD;  Location: [facility] ENDOSCOPY;  Service: Gastroenterology;  Laterality: N/A;   WISDOM TOOTH EXTRACTION     four;- age 29, 86, 70, 72    Medical History: Past Medical History:  Diagnosis Date   Anemia    Complication of anesthesia    takes alot of anesthetic for dental procedure to become numb   Elevated blood pressure reading    Family history of adverse reaction to anesthesia    takes alot of anesthetic at dentist to become numb   GERD (gastroesophageal reflux disease)    Raynaud's syndrome without gangrene     Family History: Family History  Problem Relation Age of Onset   Neurofibromatosis Mother    Anemia Mother    Neurofibromatosis Sister    Neurofibromatosis Brother    Neurofibromatosis Brother    Schizophrenia Maternal Grandfather    Kidney disease Paternal Grandmother    Cancer Paternal Grandfather 73       colon    Social History   Socioeconomic History   Marital status: Married    Spouse name: Norman   Number of children: 1   Years of education: 13.5   Highest education level: Not on file  Occupational History   Occupation: stay at home mom  Tobacco Use   Smoking status: Former    Types: E-cigarettes   Smokeless tobacco: Never   Tobacco comments:    Quit cigarettes 08-28-2020, now vape stopped 03/02/2024  Vaping Use   Vaping status: Every Day   Last attempt to quit: 08/28/2020   Devices: vape   Substance  and Sexual Activity   Alcohol use: Not Currently    Comment: ocassinally/ once a month    Drug use: Never   Sexual activity: Yes    Partners: Male    Birth control/protection: Condom  Other Topics Concern   Not on file  Social History Narrative   Not on file   Social Drivers of Health   Financial Resource Strain: Low Risk  (09/18/2022)   Overall Financial Resource Strain (CARDIA)    Difficulty of Paying Living Expenses: Not very hard  Food Insecurity: No Food Insecurity (04/24/2023)   Hunger  Vital Sign    Worried About Running Out of Food in the Last Year: Never true    Ran Out of Food in the Last Year: Never true  Transportation Needs: No Transportation Needs (04/24/2023)   PRAPARE - Administrator, Civil Service (Medical): No    Lack of Transportation (Non-Medical): No  Physical Activity: Inactive (09/18/2022)   Exercise Vital Sign    Days of Exercise per Week: 0 days    Minutes of Exercise per Session: 0 min  Stress: No Stress Concern Present (09/18/2022)   [employer] of Occupational Health - Occupational Stress Questionnaire    Feeling of Stress : Not at all  Social Connections: Moderately Integrated (09/18/2022)   Social Connection and Isolation Panel    Frequency of Communication with Friends and Family: More than three times a week    Frequency of Social Gatherings with Friends and Family: Three times a week    Attends Religious Services: More than 4 times per year    Active Member of Clubs or Organizations: No    Attends Banker Meetings: Never    Marital Status: Married  Catering manager Violence: Not At Risk (04/24/2023)   Humiliation, Afraid, Rape, and Kick questionnaire    Fear of Current or Ex-Partner: No    Emotionally Abused: No    Physically Abused: No    Sexually Abused: No      Review of Systems  Constitutional:  Positive for fatigue. Negative for chills and unexpected weight change.  HENT:  Negative for congestion, rhinorrhea, sneezing and sore throat.   Respiratory:  Negative for cough, chest tightness and shortness of breath.   Cardiovascular:  Negative for chest pain and palpitations.  Gastrointestinal:  Negative for abdominal pain, constipation, diarrhea and nausea.  Genitourinary:  Negative for dysuria.  Skin:  Positive for rash.  Neurological: Negative.   Psychiatric/Behavioral:  Negative for behavioral problems (Depression), sleep disturbance and suicidal ideas. The patient is not nervous/anxious.     Vital  Signs: BP 130/80   Pulse (!) 102   Temp 97.8 F (36.6 C)   Resp 16   Ht 5' 5 (1.651 m)   Wt 214 lb (97.1 kg)   SpO2 97%   BMI 35.61 kg/m    Physical Exam Vitals and nursing note reviewed.  Constitutional:      General: She is not in acute distress.    Appearance: Normal appearance. She is obese. She is not ill-appearing or diaphoretic.  HENT:     Head: Normocephalic and atraumatic.  Eyes:     Extraocular Movements: Extraocular movements intact.  Cardiovascular:     Rate and Rhythm: Normal rate and regular rhythm.  Pulmonary:     Effort: Pulmonary effort is normal.     Breath sounds: No wheezing or rhonchi.  Chest:     Chest wall: No tenderness.  Skin:    General:  Skin is warm and dry.  Neurological:     Mental Status: She is alert.  Psychiatric:        Mood and Affect: Mood normal.        Behavior: Behavior normal.        Assessment/Plan: 1. Nausea without vomiting (Primary) May use as needed with nausea during cycle - ondansetron  (ZOFRAN -ODT) 4 MG disintegrating tablet; Take 1 tablet (4 mg total) by mouth every 8 (eight) hours as needed for nausea or vomiting.  Dispense: 20 tablet; Refill: 0  2. Obesity (BMI 30-39.9) Will increase to 1.7mg  weekly and continue to work on diet and exercise - semaglutide -weight management (WEGOVY ) 1.7 MG/0.75ML SOAJ SQ injection; Inject 1.7 mg into the skin once a week.  Dispense: 3 mL; Refill: 0  3. Rash Will start with hydrocortisone  cream. Call if not improving. Advised on long pants/sleeves and gloves if out in wooded area again   General Counseling: Annalysa verbalizes understanding of the findings of todays visit and agrees with plan of treatment. I have discussed any further diagnostic evaluation that may be needed or ordered today. We also reviewed her medications today. she has been encouraged to call the office with any questions or concerns that should arise related to todays visit.    No orders of the defined types  were placed in this encounter.   Meds ordered this encounter  Medications   semaglutide -weight management (WEGOVY ) 1.7 MG/0.75ML SOAJ SQ injection    Sig: Inject 1.7 mg into the skin once a week.    Dispense:  3 mL    Refill:  0   ondansetron  (ZOFRAN -ODT) 4 MG disintegrating tablet    Sig: Take 1 tablet (4 mg total) by mouth every 8 (eight) hours as needed for nausea or vomiting.    Dispense:  20 tablet    Refill:  0    This patient was seen by Tinnie Pro, PA-C in collaboration with Dr. Sigrid Bathe as a part of collaborative care agreement.   Total time spent:30 Minutes Time spent includes review of chart, medications, test results, and follow up plan with the patient.      Dr Fozia M Khan Internal medicine

## 2024-10-05 ENCOUNTER — Telehealth: Payer: Self-pay

## 2024-10-05 NOTE — Telephone Encounter (Signed)
 Sent MyChart message to patient regarding insurance not covering GLP-1s.

## 2024-10-12 ENCOUNTER — Other Ambulatory Visit: Payer: Self-pay | Admitting: Physician Assistant

## 2024-10-12 DIAGNOSIS — R110 Nausea: Secondary | ICD-10-CM

## 2024-10-15 ENCOUNTER — Ambulatory Visit: Admitting: Physician Assistant

## 2024-10-29 ENCOUNTER — Ambulatory Visit: Admitting: Physician Assistant

## 2024-11-29 ENCOUNTER — Ambulatory Visit: Admitting: Physician Assistant

## 2024-12-13 ENCOUNTER — Encounter: Payer: Medicaid Other | Admitting: Physician Assistant

## 2024-12-24 ENCOUNTER — Encounter: Admitting: Physician Assistant

## 2025-01-04 ENCOUNTER — Other Ambulatory Visit: Payer: Self-pay

## 2025-01-04 ENCOUNTER — Encounter: Payer: Self-pay | Admitting: Emergency Medicine

## 2025-01-04 DIAGNOSIS — Z794 Long term (current) use of insulin: Secondary | ICD-10-CM | POA: Insufficient documentation

## 2025-01-04 DIAGNOSIS — N132 Hydronephrosis with renal and ureteral calculous obstruction: Secondary | ICD-10-CM | POA: Insufficient documentation

## 2025-01-04 MED ORDER — OXYCODONE-ACETAMINOPHEN 5-325 MG PO TABS
1.0000 | ORAL_TABLET | ORAL | Status: DC | PRN
  Administered 2025-01-04: 1 via ORAL
  Filled 2025-01-04: qty 1

## 2025-01-04 MED ORDER — ONDANSETRON 4 MG PO TBDP
4.0000 mg | ORAL_TABLET | Freq: Once | ORAL | Status: AC
  Administered 2025-01-04: 4 mg via ORAL
  Filled 2025-01-04: qty 1

## 2025-01-04 NOTE — ED Triage Notes (Signed)
 Patient ambulatory to triage with steady gait, without difficulty or distress noted; pt reports left flank pain radiating around into abd accomp by nausea since 8pm; zofran  and advil  taken PTA; st hx of same and dx with kidney stone

## 2025-01-05 ENCOUNTER — Emergency Department: Payer: Self-pay

## 2025-01-05 ENCOUNTER — Emergency Department
Admission: EM | Admit: 2025-01-05 | Discharge: 2025-01-05 | Disposition: A | Payer: Self-pay | Attending: Emergency Medicine | Admitting: Emergency Medicine

## 2025-01-05 DIAGNOSIS — N200 Calculus of kidney: Secondary | ICD-10-CM

## 2025-01-05 LAB — COMPREHENSIVE METABOLIC PANEL WITH GFR
ALT: 25 U/L (ref 0–44)
AST: 22 U/L (ref 15–41)
Albumin: 4.5 g/dL (ref 3.5–5.0)
Alkaline Phosphatase: 110 U/L (ref 38–126)
Anion gap: 10 (ref 5–15)
BUN: 9 mg/dL (ref 6–20)
CO2: 27 mmol/L (ref 22–32)
Calcium: 9.9 mg/dL (ref 8.9–10.3)
Chloride: 104 mmol/L (ref 98–111)
Creatinine, Ser: 0.75 mg/dL (ref 0.44–1.00)
GFR, Estimated: >60 mL/min
Glucose, Bld: 97 mg/dL (ref 70–99)
Potassium: 4.1 mmol/L (ref 3.5–5.1)
Sodium: 140 mmol/L (ref 135–145)
Total Bilirubin: 0.4 mg/dL (ref 0.0–1.2)
Total Protein: 7.9 g/dL (ref 6.5–8.1)

## 2025-01-05 LAB — URINALYSIS, ROUTINE W REFLEX MICROSCOPIC
Bilirubin Urine: NEGATIVE
Glucose, UA: NEGATIVE mg/dL
Ketones, ur: NEGATIVE mg/dL
Nitrite: NEGATIVE
Protein, ur: 30 mg/dL — AB
RBC / HPF: >50 RBC/hpf (ref 0–5)
Specific Gravity, Urine: 1.021 (ref 1.005–1.030)
pH: 6 (ref 5.0–8.0)

## 2025-01-05 LAB — CBC WITH DIFFERENTIAL/PLATELET
Abs Immature Granulocytes: 0.02 K/uL (ref 0.00–0.07)
Basophils Absolute: 0 K/uL (ref 0.0–0.1)
Basophils Relative: 0 %
Eosinophils Absolute: 0.2 K/uL (ref 0.0–0.5)
Eosinophils Relative: 2 %
HCT: 43.9 % (ref 36.0–46.0)
Hemoglobin: 14 g/dL (ref 12.0–15.0)
Immature Granulocytes: 0 %
Lymphocytes Relative: 28 %
Lymphs Abs: 3.1 K/uL (ref 0.7–4.0)
MCH: 26.7 pg (ref 26.0–34.0)
MCHC: 31.9 g/dL (ref 30.0–36.0)
MCV: 83.8 fL (ref 80.0–100.0)
Monocytes Absolute: 0.8 K/uL (ref 0.1–1.0)
Monocytes Relative: 7 %
Neutro Abs: 6.8 K/uL (ref 1.7–7.7)
Neutrophils Relative %: 63 %
Platelets: 323 K/uL (ref 150–400)
RBC: 5.24 MIL/uL — ABNORMAL HIGH (ref 3.87–5.11)
RDW: 12.6 % (ref 11.5–15.5)
WBC: 10.8 K/uL — ABNORMAL HIGH (ref 4.0–10.5)
nRBC: 0 % (ref 0.0–0.2)

## 2025-01-05 LAB — POC URINE PREG, ED: Preg Test, Ur: NEGATIVE

## 2025-01-05 MED ORDER — SODIUM CHLORIDE 0.9 % IV BOLUS (SEPSIS): 1000.0000 mL | Freq: Once | INTRAVENOUS | Status: DC

## 2025-01-05 MED ORDER — TAMSULOSIN HCL 0.4 MG PO CAPS: 0.4000 mg | ORAL_CAPSULE | Freq: Every day | ORAL | 0 refills | Status: DC

## 2025-01-05 MED ORDER — MORPHINE SULFATE (PF) 4 MG/ML IV SOLN
4.0000 mg | Freq: Once | INTRAVENOUS | Status: DC
  Filled 2025-01-05: qty 1

## 2025-01-05 MED ORDER — ONDANSETRON HCL 4 MG/2ML IJ SOLN
4.0000 mg | Freq: Once | INTRAMUSCULAR | Status: DC
  Filled 2025-01-05: qty 2

## 2025-01-05 MED ORDER — IBUPROFEN 800 MG PO TABS: 800.0000 mg | ORAL_TABLET | Freq: Three times a day (TID) | ORAL | 0 refills | Status: AC | PRN

## 2025-01-05 MED ORDER — OXYCODONE-ACETAMINOPHEN 5-325 MG PO TABS
1.0000 | ORAL_TABLET | Freq: Once | ORAL | Status: AC
  Administered 2025-01-05: 1 via ORAL
  Filled 2025-01-05: qty 1

## 2025-01-05 MED ORDER — ONDANSETRON 4 MG PO TBDP: 4.0000 mg | ORAL_TABLET | Freq: Four times a day (QID) | ORAL | 0 refills | Status: DC | PRN

## 2025-01-05 MED ORDER — OXYCODONE HCL 5 MG PO TABS: 5.0000 mg | ORAL_TABLET | Freq: Three times a day (TID) | ORAL | 0 refills | Status: DC | PRN

## 2025-01-05 MED ORDER — IBUPROFEN 800 MG PO TABS
800.0000 mg | ORAL_TABLET | Freq: Once | ORAL | Status: DC
  Filled 2025-01-05: qty 1

## 2025-01-05 MED ORDER — KETOROLAC TROMETHAMINE 30 MG/ML IJ SOLN
30.0000 mg | Freq: Once | INTRAMUSCULAR | Status: DC
  Filled 2025-01-05: qty 1

## 2025-01-05 NOTE — ED Provider Notes (Signed)
 "  [facility] Provider Note    Event Date/Time   First MD Initiated Contact with Patient 01/05/25 0117     (approximate)   History   Flank Pain   HPI  Kathryn Estrada is a 29 y.o. female with history of previous kidney stones, GERD who presents to the emergency department with complaints of sudden onset left flank pain with nausea and vomiting.  No fevers, dysuria or hematuria, vaginal bleeding or discharge.  Felt similar to previous kidney stone but more severe.  Previous C-section but no other abdominal surgery.  Took ibuprofen  and Zofran  prior to arrival.   History provided by patient and mother.    Past Medical History:  Diagnosis Date   Anemia    Complication of anesthesia    takes alot of anesthetic for dental procedure to become numb   Elevated blood pressure reading    Family history of adverse reaction to anesthesia    takes alot of anesthetic at dentist to become numb   GERD (gastroesophageal reflux disease)    Raynaud's syndrome without gangrene     Past Surgical History:  Procedure Laterality Date   BIOPSY  12/10/2023   Procedure: BIOPSY;  Surgeon: Unk Corinn Skiff, MD;  Location: [facility] ENDOSCOPY;  Service: Gastroenterology;;   CESAREAN SECTION N/A 04/03/2021   Procedure: CESAREAN SECTION;  Surgeon: Victor Claudell SAUNDERS, MD;  Location: [facility] ORS;  Service: Obstetrics;  Laterality: N/A;   CESAREAN SECTION N/A 04/24/2023   Procedure: REPEAT CESAREAN SECTION;  Surgeon: Connell Davies, MD;  Location: [facility] ORS;  Service: Obstetrics;  Laterality: N/A;   ESOPHAGOGASTRODUODENOSCOPY (EGD) WITH PROPOFOL  N/A 12/10/2023   Procedure: ESOPHAGOGASTRODUODENOSCOPY (EGD) WITH PROPOFOL ;  Surgeon: Unk Corinn Skiff, MD;  Location: [facility] ENDOSCOPY;  Service: Gastroenterology;  Laterality: N/A;   WISDOM TOOTH EXTRACTION     four;- age 75, 21, 35, 44    MEDICATIONS:  Prior to Admission medications  Medication Sig Start Date End Date Taking?  Authorizing Provider  ondansetron  (ZOFRAN -ODT) 4 MG disintegrating tablet Take 1 tablet (4 mg total) by mouth every 8 (eight) hours as needed for nausea or vomiting. 09/16/24   McDonough, Tinnie POUR, PA-C  semaglutide -weight management (WEGOVY ) 1.7 MG/0.75ML SOAJ SQ injection Inject 1.7 mg into the skin once a week. 09/16/24   Kristina Tinnie POUR, PA-C    Physical Exam   Triage Vital Signs: ED Triage Vitals  Encounter Vitals Group     BP 01/04/25 2321 123/66     Girls Systolic BP Percentile --      Girls Diastolic BP Percentile --      Boys Systolic BP Percentile --      Boys Diastolic BP Percentile --      Pulse Rate 01/04/25 2321 (!) 105     Resp 01/04/25 2321 19     Temp 01/04/25 2321 (!) 97.5 F (36.4 C)     Temp Source 01/04/25 2321 Oral     SpO2 01/04/25 2321 100 %     Weight 01/04/25 2310 195 lb (88.5 kg)     Height 01/04/25 2310 5' 5 (1.651 m)     Head Circumference --      Peak Flow --      Pain Score 01/04/25 2310 7     Pain Loc --      Pain Education --      Exclude from Growth Chart --     Most recent vital signs: Vitals:   01/04/25 2321 01/05/25 0159  BP:  123/66 113/85  Pulse: (!) 105 93  Resp: 19 18  Temp: (!) 97.5 F (36.4 C) 98 F (36.7 C)  SpO2: 100% 97%    CONSTITUTIONAL: Alert, responds appropriately to questions. Well-appearing; well-nourished HEAD: Normocephalic, atraumatic EYES: Conjunctivae clear, pupils appear equal, sclera nonicteric ENT: normal nose; moist mucous membranes NECK: Supple, normal ROM CARD: RRR; S1 and S2 appreciated RESP: Normal chest excursion without splinting or tachypnea; breath sounds clear and equal bilaterally; no wheezes, no rhonchi, no rales, no hypoxia or respiratory distress, speaking full sentences ABD/GI: Non-distended; soft, non-tender, no rebound, no guarding, no peritoneal signs BACK: The back appears normal, no CVA tenderness EXT: Normal ROM in all joints; no deformity noted, no edema SKIN: Normal color for age  and race; warm; no rash on exposed skin NEURO: Moves all extremities equally, normal speech PSYCH: The patient's mood and manner are appropriate.   ED Results / Procedures / Treatments   LABS: (all labs ordered are listed, but only abnormal results are displayed) Labs Reviewed  CBC WITH DIFFERENTIAL/PLATELET - Abnormal; Notable for the following components:      Result Value   WBC 10.8 (*)    RBC 5.24 (*)    All other components within normal limits  URINALYSIS, ROUTINE W REFLEX MICROSCOPIC - Abnormal; Notable for the following components:   Color, Urine YELLOW (*)    APPearance CLOUDY (*)    Hgb urine dipstick LARGE (*)    Protein, ur 30 (*)    Leukocytes,Ua TRACE (*)    Bacteria, UA RARE (*)    All other components within normal limits  COMPREHENSIVE METABOLIC PANEL WITH GFR  POC URINE PREG, ED     EKG:  EKG Interpretation Date/Time:    Ventricular Rate:    PR Interval:    QRS Duration:    QT Interval:    QTC Calculation:   R Axis:      Text Interpretation:           RADIOLOGY: My personal review and interpretation of imaging: CT scan shows stone at the left UPJ.  I have personally reviewed all radiology reports.   CT Renal Stone Study Result Date: 01/05/2025 EXAM: CT ABDOMEN AND PELVIS WITHOUT CONTRAST 01/05/2025 12:17:47 AM TECHNIQUE: CT of the abdomen and pelvis was performed without the administration of intravenous contrast. Multiplanar reformatted images are provided for review. Automated exposure control, iterative reconstruction, and/or weight-based adjustment of the mA/kV was utilized to reduce the radiation dose to as low as reasonably achievable. COMPARISON: 10/24/2023 CLINICAL HISTORY: Abdominal/flank pain, stone suspected. The patient presents with abdominal and flank pain, with a suspected kidney stone. FINDINGS: LOWER CHEST: No acute abnormality. LIVER: The liver is unremarkable. GALLBLADDER AND BILE DUCTS: Gallbladder is unremarkable. No biliary  ductal dilatation. SPLEEN: No acute abnormality. PANCREAS: No acute abnormality. ADRENAL GLANDS: No acute abnormality. KIDNEYS, URETERS AND BLADDER: Left: 6 mm calcified stone at the left ureteropelvic junction. Associated proximal hydronephrosis. No ureterolithiasis distally. Right: Punctate right nephrolithiasis. No right ureterolithiasis. No right hydroureteronephrosis. No perinephric or periureteral stranding. Urinary bladder is unremarkable. GI AND BOWEL: Stomach demonstrates no acute abnormality. No small or large bowel thickening or dilatation. The appendix is unremarkable. There is no bowel obstruction. PERITONEUM AND RETROPERITONEUM: No ascites. No free air. VASCULATURE: Aorta is normal in caliber. LYMPH NODES: No lymphadenopathy. REPRODUCTIVE ORGANS: The uterus is unremarkable. No adnexal mass. BONES AND SOFT TISSUES: No acute osseous abnormality. No focal soft tissue abnormality. IMPRESSION: 1. Obstructive 6 mm left ureteropelvic junction stone.  2. Nonobstructive punctate right nephrolithiasis. Electronically signed by: Morgane Naveau MD 01/05/2025 12:29 AM EST RP Workstation: [id number]     PROCEDURES:  Critical Care performed: No     Procedures    IMPRESSION / MDM / ASSESSMENT AND PLAN / ED COURSE  I reviewed the triage vital signs and the nursing notes.    Patient here with flank pain, vomiting.  History of kidney stones.   DIFFERENTIAL DIAGNOSIS (includes but not limited to):   Kidney stone pyelonephritis, ascending UTI, musculoskeletal back pain, colitis, diverticulitis, doubt appendicitis   Patient's presentation is most consistent with acute presentation with potential threat to life or bodily function.   PLAN: Workup initiated from triage.  Slight leukocytosis likely reactive.  Afebrile here.  Normal creatinine, LFTs.  Urine shows blood and some pyuria and bacteria but no other sign of infection and may be contaminated sample as there are multiple squamous cells.  She  denies any dysuria, frequency or urgency.  Pregnancy test negative.  CT scan reviewed and interpreted by myself and the radiologist and shows an obstructive 6 mm stone at the left UPJ with hydronephrosis.  Appendix appears normal.  She reports pain has improved after oral medications from triage and pain is starting to come back slightly.  Have offered her IV medications and fluids here which she declines.  Will give additional pain medicine here by mouth per her request.  Recommended increase fluid intake.  Will discharge with pain medicine, urine strainer, urology follow-up, prescription for Flomax .  Discussed return precautions.  Patient and mother comfortable with this plan.  Given pain well-controlled without signs of superimposed infection, sepsis and she is tolerating p.o. here, I feel she is safe for discharge with outpatient follow-up.   MEDICATIONS GIVEN IN ED: Medications  oxyCODONE -acetaminophen  (PERCOCET/ROXICET) 5-325 MG per tablet 1 tablet (1 tablet Oral Given 01/04/25 2324)  ondansetron  (ZOFRAN -ODT) disintegrating tablet 4 mg (4 mg Oral Given 01/04/25 2325)  oxyCODONE -acetaminophen  (PERCOCET/ROXICET) 5-325 MG per tablet 1 tablet (1 tablet Oral Given 01/05/25 0135)     ED COURSE:  At this time, I do not feel there is any life-threatening condition present. I reviewed all nursing notes, vitals, pertinent previous records.  All lab and urine results, EKGs, imaging ordered have been independently reviewed and interpreted by myself.  I reviewed all available radiology reports from any imaging ordered this visit.  Based on my assessment, I feel the patient is safe to be discharged home without further emergent workup and can continue workup as an outpatient as needed. Discussed all findings, treatment plan as well as usual and customary return precautions.  They verbalize understanding and are comfortable with this plan.  Outpatient follow-up has been provided as needed.  All questions have been  answered.    CONSULTS:  none   OUTSIDE RECORDS REVIEWED: Reviewed previous PCP notes.       FINAL CLINICAL IMPRESSION(S) / ED DIAGNOSES   Final diagnoses:  Kidney stone     Rx / DC Orders   ED Discharge Orders          Ordered    ibuprofen  (ADVIL ) 800 MG tablet  Every 8 hours PRN        01/05/25 0139    ondansetron  (ZOFRAN -ODT) 4 MG disintegrating tablet  Every 6 hours PRN        01/05/25 0139    tamsulosin  (FLOMAX ) 0.4 MG CAPS capsule  Daily        01/05/25 0139    oxyCODONE  (ROXICODONE ) 5  MG immediate release tablet  Every 8 hours PRN        01/05/25 0139             Note:  This document was prepared using Dragon voice recognition software and may include unintentional dictation errors.   Laurana Magistro, Josette SAILOR, DO 01/05/25 0216  "

## 2025-01-05 NOTE — ED Notes (Signed)
 Pt reports pain much better after medications, now 2/10

## 2025-01-05 NOTE — Discharge Instructions (Addendum)

## 2025-01-11 ENCOUNTER — Other Ambulatory Visit: Payer: Self-pay

## 2025-01-11 ENCOUNTER — Encounter: Payer: Self-pay | Admitting: Physician Assistant

## 2025-01-11 ENCOUNTER — Ambulatory Visit (INDEPENDENT_AMBULATORY_CARE_PROVIDER_SITE_OTHER): Payer: Self-pay | Admitting: Physician Assistant

## 2025-01-11 VITALS — BP 108/72 | HR 103 | Ht 65.0 in | Wt 213.2 lb

## 2025-01-11 DIAGNOSIS — Z87442 Personal history of urinary calculi: Secondary | ICD-10-CM

## 2025-01-11 DIAGNOSIS — N201 Calculus of ureter: Secondary | ICD-10-CM

## 2025-01-11 DIAGNOSIS — K5909 Other constipation: Secondary | ICD-10-CM

## 2025-01-11 DIAGNOSIS — N200 Calculus of kidney: Secondary | ICD-10-CM

## 2025-01-11 LAB — URINALYSIS, COMPLETE
Bilirubin, UA: NEGATIVE
Glucose, UA: NEGATIVE
Ketones, UA: NEGATIVE
Leukocytes,UA: NEGATIVE
Nitrite, UA: NEGATIVE
Protein,UA: NEGATIVE
RBC, UA: NEGATIVE
Specific Gravity, UA: 1.015 (ref 1.005–1.030)
Urobilinogen, Ur: 1 mg/dL (ref 0.2–1.0)
pH, UA: 8 — ABNORMAL HIGH (ref 5.0–7.5)

## 2025-01-11 LAB — MICROSCOPIC EXAMINATION

## 2025-01-11 MED ORDER — ONDANSETRON 4 MG PO TBDP: 4.0000 mg | ORAL_TABLET | Freq: Four times a day (QID) | ORAL | 0 refills | Status: AC | PRN

## 2025-01-11 MED ORDER — OXYCODONE HCL 5 MG PO TABS: 5.0000 mg | ORAL_TABLET | Freq: Four times a day (QID) | ORAL | 0 refills | Status: AC | PRN

## 2025-01-11 NOTE — Progress Notes (Signed)
 ESWL ORDER FORM  Expected date of procedure: 01/13/2025  Surgeon: Glendia Barba, MD  Post op standing: 2-4wk follow up w/KUB prior  Anticoagulation/Aspirin/NSAID standing order: Hold all 72 hours prior  Anesthesia standing order: MAC  VTE standing: SCD's  Dx: Left Ureteral Stone  Procedure: left Extracorporeal shock wave lithotripsy  CPT : 50590  Standing Order Set:   *NPO after mn, KUB  *NS 170ml/hr, Keflex  500mg  PO, Benadryl  25mg  PO, Valium  10mg  PO, Zofran  4mg  IV  Medications if other than standing orders:   Ancef  2gm IV

## 2025-01-11 NOTE — Progress Notes (Signed)
"  ° ° °  [place] ESWL POSTING SHEET        Patient Name: Kathryn Estrada  DOB: 19-Sep-1996  MRN: [id number]  Surgeon:  Glendia Barba, MD  Diagnosis:  Left Ureteral Stone  CPT: 49409  ESWL DATE: 01/13/2025  ESWL TIME: 1425  Special Needs/Requirements: None       Cardiac/Medical/Pulmonary Clearance needed: None       Form Faxed to [facility]- [phone] Date:   Date: 01/11/25       Form Faxed to [place]- [phone]  Date:  Date: 01/11/25           Copy Made for Insurance PA:  Date: 01/11/25       Orders Entered in to Epic:  Date: 01/11/25                   "

## 2025-01-11 NOTE — H&P (View-Only) (Signed)
 "  01/11/2025 11:00 AM   Ronnald Norris Lamp 1996/10/01 [id number]  CC: Chief Complaint  Patient presents with   Nephrolithiasis   HPI: Kathryn Estrada is a 29 y.o. female with PMH nephrolithiasis who presents today as a new patient for evaluation of a 6 mm left UPJ stone.  She is accompanied today by her mother, who contributes to HPI.  She was seen in the ED overnight on 01/04/2025 for evaluation of acute left flank pain radiating to the abdomen and nausea.  CT stone study showed a 6 mm left UPJ stone and a 2 mm nonobstructing right upper pole stone.  The UPJ stone measures up to 1350 HU in density, approximate 10 cm skin to stone distance.  Today she reports ongoing left flank pain.  She is rather uncomfortable.  She has had at least 2 prior stone episodes and thinks she spontaneously passed them.  She recalls that they were both rather small, around 2 mm.  She had her first stone episode at age 25.  No known history of IBD, though she does have a history of chronic nausea and constipation.  She previously saw [facility], but requests a second opinion.  No family history of stones.  She took Advil  most recently 3 days ago.  She is on Wegovy , dosed 6 days ago (Wednesdays).  In-office UA and microscopy pan negative.  PMH: Past Medical History:  Diagnosis Date   Anemia    Complication of anesthesia    takes alot of anesthetic for dental procedure to become numb   Elevated blood pressure reading    Family history of adverse reaction to anesthesia    takes alot of anesthetic at dentist to become numb   GERD (gastroesophageal reflux disease)    Raynaud's syndrome without gangrene     Surgical History: Past Surgical History:  Procedure Laterality Date   BIOPSY  12/10/2023   Procedure: BIOPSY;  Surgeon: Unk Corinn Skiff, MD;  Location: [facility] ENDOSCOPY;  Service: Gastroenterology;;   CESAREAN SECTION N/A 04/03/2021   Procedure: CESAREAN SECTION;  Surgeon:  Victor Claudell SAUNDERS, MD;  Location: [facility] ORS;  Service: Obstetrics;  Laterality: N/A;   CESAREAN SECTION N/A 04/24/2023   Procedure: REPEAT CESAREAN SECTION;  Surgeon: Connell Davies, MD;  Location: [facility] ORS;  Service: Obstetrics;  Laterality: N/A;   ESOPHAGOGASTRODUODENOSCOPY (EGD) WITH PROPOFOL  N/A 12/10/2023   Procedure: ESOPHAGOGASTRODUODENOSCOPY (EGD) WITH PROPOFOL ;  Surgeon: Unk Corinn Skiff, MD;  Location: [facility] ENDOSCOPY;  Service: Gastroenterology;  Laterality: N/A;   WISDOM TOOTH EXTRACTION     four;- age 45, 53, 83, 19    Home Medications:  Allergies as of 01/11/2025   No Known Allergies      Medication List        Accurate as of January 11, 2025 11:00 AM. If you have any questions, ask your nurse or doctor.          STOP taking these medications    ondansetron  4 MG disintegrating tablet Commonly known as: ZOFRAN -ODT Stopped by: Lucie Hones   oxyCODONE  5 MG immediate release tablet Commonly known as: Roxicodone  Stopped by: Lucie Hones   tamsulosin  0.4 MG Caps capsule Commonly known as: Flomax  Stopped by: Greggory Safranek   Wegovy  1.7 MG/0.75ML Soaj SQ injection Generic drug: semaglutide -weight management Stopped by: Lucie Hones       TAKE these medications    ibuprofen  800 MG tablet Commonly known as: ADVIL  Take 1 tablet (800 mg total) by mouth every 8 (eight) hours  as needed.   predniSONE 10 MG tablet Commonly known as: DELTASONE Take by mouth.   triamcinolone  cream 0.5 % Commonly known as: KENALOG  SMARTSIG:sparingly Topical Twice Daily        Allergies:  Allergies[1]  Family History: Family History  Problem Relation Age of Onset   Neurofibromatosis Mother    Anemia Mother    Neurofibromatosis Sister    Neurofibromatosis Brother    Neurofibromatosis Brother    Schizophrenia Maternal Grandfather    Kidney disease Paternal Grandmother    Cancer Paternal Grandfather 25       colon    Social  History:   reports that she has quit smoking. Her smoking use included e-cigarettes. She has never used smokeless tobacco. She reports that she does not currently use alcohol. She reports that she does not use drugs.  Physical Exam: LMP 01/02/2025   Constitutional:  Alert and oriented, no acute distress, nontoxic appearing HEENT: [place], AT Cardiovascular: No clubbing, cyanosis, or edema Respiratory: Normal respiratory effort, no increased work of breathing Skin: No rashes, bruises or suspicious lesions Neurologic: Grossly intact, no focal deficits, moving all 4 extremities Psychiatric: Normal mood and affect  Laboratory Data: See epic  Pertinent Imaging: Results for orders placed during the hospital encounter of 01/05/25  CT Renal Stone Study  Narrative EXAM: CT ABDOMEN AND PELVIS WITHOUT CONTRAST 01/05/2025 12:17:47 AM  TECHNIQUE: CT of the abdomen and pelvis was performed without the administration of intravenous contrast. Multiplanar reformatted images are provided for review. Automated exposure control, iterative reconstruction, and/or weight-based adjustment of the mA/kV was utilized to reduce the radiation dose to as low as reasonably achievable.  COMPARISON: 10/24/2023  CLINICAL HISTORY: Abdominal/flank pain, stone suspected. The patient presents with abdominal and flank pain, with a suspected kidney stone.  FINDINGS:  LOWER CHEST: No acute abnormality.  LIVER: The liver is unremarkable.  GALLBLADDER AND BILE DUCTS: Gallbladder is unremarkable. No biliary ductal dilatation.  SPLEEN: No acute abnormality.  PANCREAS: No acute abnormality.  ADRENAL GLANDS: No acute abnormality.  KIDNEYS, URETERS AND BLADDER: Left: 6 mm calcified stone at the left ureteropelvic junction. Associated proximal hydronephrosis. No ureterolithiasis distally. Right: Punctate right nephrolithiasis. No right ureterolithiasis. No right hydroureteronephrosis. No perinephric or  periureteral stranding. Urinary bladder is unremarkable.  GI AND BOWEL: Stomach demonstrates no acute abnormality. No small or large bowel thickening or dilatation. The appendix is unremarkable. There is no bowel obstruction.  PERITONEUM AND RETROPERITONEUM: No ascites. No free air.  VASCULATURE: Aorta is normal in caliber.  LYMPH NODES: No lymphadenopathy.  REPRODUCTIVE ORGANS: The uterus is unremarkable. No adnexal mass.  BONES AND SOFT TISSUES: No acute osseous abnormality. No focal soft tissue abnormality.  IMPRESSION: 1. Obstructive 6 mm left ureteropelvic junction stone. 2. Nonobstructive punctate right nephrolithiasis.  Electronically signed by: Morgane Naveau MD 01/05/2025 12:29 AM EST RP Workstation: [id number]  I personally reviewed the images referenced above and note a 6 mm left UPJ stone.  Assessment & Plan:   1. Left ureteral stone (Primary) Renal colic secondary to a 6 mm left UPJ stone.  UA bland, no evidence of infection.  We discussed various treatment options for her stone including trial of passage vs. ESWL vs. ureteroscopy with laser lithotripsy and stent.  We specifically discussed that ESWL is a less invasive procedure that requires less anesthesia, however patients have to pass their residual stone fragments, which may take several weeks and be associated with continued renal colic. Additionally, we discussed the limitations of ESWL including the ability  to only treat one stone in a single treatment and the low, 10-20% chance of treatment failure requiring repeat ESWL versus ureteroscopy in the future (we specifically discussed that her stone is rather dense, so I would estimate her chance of treatment failure a bit higher, closer to 20%). By comparison, ureteroscopy is a more invasive surgical procedure that requires more anesthesia and placement of a ureteral stent, which will remain in place for approximately 3-10 days and can be associated with flank  pain, bladder pain, dysuria, urgency, frequency, urinary leakage, and gross hematuria.  Based on this conversation, she would like to proceed with ESWL.  I am refilling oxycodone  and Zofran  for symptom control in the meantime.  I advised her to hold all NSAIDs and tomorrow's dose of Wegovy  in advance of procedure in 2 days.  They are self-pay and we discussed that there will likely be an upfront payment required for this.  They understand and wish to proceed. - Urinalysis, Complete - Ambulatory Referral For Surgery Scheduling - CULTURE, URINE COMPREHENSIVE - oxyCODONE  (ROXICODONE ) 5 MG immediate release tablet; Take 1 tablet (5 mg total) by mouth every 6 (six) hours as needed.  Dispense: 12 tablet; Refill: 0 - ondansetron  (ZOFRAN -ODT) 4 MG disintegrating tablet; Take 1 tablet (4 mg total) by mouth every 6 (six) hours as needed for nausea or vomiting.  Dispense: 30 tablet; Refill: 0  2. Recurrent nephrolithiasis Recurrent nephrolithiasis since age 28.  I recommended metabolic workup after clearance of her stone and they agreed.  3. Chronic constipation No known IBD to contribute to #2 above, though she does have chronic nausea and constipation.  She requests a second opinion with GI, referral placed today. - Ambulatory referral to Gastroenterology   Return in 2 days (on 01/13/2025) for ESWL with Dr. Twylla.  Lucie Hones, PA-C  [facility] [place] [address] [place], [place] [address] [phone]      [1] No Known Allergies  "

## 2025-01-11 NOTE — Progress Notes (Signed)
 "  01/11/2025 11:00 AM   Ronnald Norris Lamp 1996/10/01 [id number]  CC: Chief Complaint  Patient presents with   Nephrolithiasis   HPI: Kathryn Estrada is a 29 y.o. female with PMH nephrolithiasis who presents today as a new patient for evaluation of a 6 mm left UPJ stone.  She is accompanied today by her mother, who contributes to HPI.  She was seen in the ED overnight on 01/04/2025 for evaluation of acute left flank pain radiating to the abdomen and nausea.  CT stone study showed a 6 mm left UPJ stone and a 2 mm nonobstructing right upper pole stone.  The UPJ stone measures up to 1350 HU in density, approximate 10 cm skin to stone distance.  Today she reports ongoing left flank pain.  She is rather uncomfortable.  She has had at least 2 prior stone episodes and thinks she spontaneously passed them.  She recalls that they were both rather small, around 2 mm.  She had her first stone episode at age 25.  No known history of IBD, though she does have a history of chronic nausea and constipation.  She previously saw [facility], but requests a second opinion.  No family history of stones.  She took Advil  most recently 3 days ago.  She is on Wegovy , dosed 6 days ago (Wednesdays).  In-office UA and microscopy pan negative.  PMH: Past Medical History:  Diagnosis Date   Anemia    Complication of anesthesia    takes alot of anesthetic for dental procedure to become numb   Elevated blood pressure reading    Family history of adverse reaction to anesthesia    takes alot of anesthetic at dentist to become numb   GERD (gastroesophageal reflux disease)    Raynaud's syndrome without gangrene     Surgical History: Past Surgical History:  Procedure Laterality Date   BIOPSY  12/10/2023   Procedure: BIOPSY;  Surgeon: Unk Corinn Skiff, MD;  Location: [facility] ENDOSCOPY;  Service: Gastroenterology;;   CESAREAN SECTION N/A 04/03/2021   Procedure: CESAREAN SECTION;  Surgeon:  Victor Claudell SAUNDERS, MD;  Location: [facility] ORS;  Service: Obstetrics;  Laterality: N/A;   CESAREAN SECTION N/A 04/24/2023   Procedure: REPEAT CESAREAN SECTION;  Surgeon: Connell Davies, MD;  Location: [facility] ORS;  Service: Obstetrics;  Laterality: N/A;   ESOPHAGOGASTRODUODENOSCOPY (EGD) WITH PROPOFOL  N/A 12/10/2023   Procedure: ESOPHAGOGASTRODUODENOSCOPY (EGD) WITH PROPOFOL ;  Surgeon: Unk Corinn Skiff, MD;  Location: [facility] ENDOSCOPY;  Service: Gastroenterology;  Laterality: N/A;   WISDOM TOOTH EXTRACTION     four;- age 45, 53, 83, 19    Home Medications:  Allergies as of 01/11/2025   No Known Allergies      Medication List        Accurate as of January 11, 2025 11:00 AM. If you have any questions, ask your nurse or doctor.          STOP taking these medications    ondansetron  4 MG disintegrating tablet Commonly known as: ZOFRAN -ODT Stopped by: Lucie Hones   oxyCODONE  5 MG immediate release tablet Commonly known as: Roxicodone  Stopped by: Lucie Hones   tamsulosin  0.4 MG Caps capsule Commonly known as: Flomax  Stopped by: Greggory Safranek   Wegovy  1.7 MG/0.75ML Soaj SQ injection Generic drug: semaglutide -weight management Stopped by: Lucie Hones       TAKE these medications    ibuprofen  800 MG tablet Commonly known as: ADVIL  Take 1 tablet (800 mg total) by mouth every 8 (eight) hours  as needed.   predniSONE 10 MG tablet Commonly known as: DELTASONE Take by mouth.   triamcinolone  cream 0.5 % Commonly known as: KENALOG  SMARTSIG:sparingly Topical Twice Daily        Allergies:  Allergies[1]  Family History: Family History  Problem Relation Age of Onset   Neurofibromatosis Mother    Anemia Mother    Neurofibromatosis Sister    Neurofibromatosis Brother    Neurofibromatosis Brother    Schizophrenia Maternal Grandfather    Kidney disease Paternal Grandmother    Cancer Paternal Grandfather 25       colon    Social  History:   reports that she has quit smoking. Her smoking use included e-cigarettes. She has never used smokeless tobacco. She reports that she does not currently use alcohol. She reports that she does not use drugs.  Physical Exam: LMP 01/02/2025   Constitutional:  Alert and oriented, no acute distress, nontoxic appearing HEENT: [place], AT Cardiovascular: No clubbing, cyanosis, or edema Respiratory: Normal respiratory effort, no increased work of breathing Skin: No rashes, bruises or suspicious lesions Neurologic: Grossly intact, no focal deficits, moving all 4 extremities Psychiatric: Normal mood and affect  Laboratory Data: See epic  Pertinent Imaging: Results for orders placed during the hospital encounter of 01/05/25  CT Renal Stone Study  Narrative EXAM: CT ABDOMEN AND PELVIS WITHOUT CONTRAST 01/05/2025 12:17:47 AM  TECHNIQUE: CT of the abdomen and pelvis was performed without the administration of intravenous contrast. Multiplanar reformatted images are provided for review. Automated exposure control, iterative reconstruction, and/or weight-based adjustment of the mA/kV was utilized to reduce the radiation dose to as low as reasonably achievable.  COMPARISON: 10/24/2023  CLINICAL HISTORY: Abdominal/flank pain, stone suspected. The patient presents with abdominal and flank pain, with a suspected kidney stone.  FINDINGS:  LOWER CHEST: No acute abnormality.  LIVER: The liver is unremarkable.  GALLBLADDER AND BILE DUCTS: Gallbladder is unremarkable. No biliary ductal dilatation.  SPLEEN: No acute abnormality.  PANCREAS: No acute abnormality.  ADRENAL GLANDS: No acute abnormality.  KIDNEYS, URETERS AND BLADDER: Left: 6 mm calcified stone at the left ureteropelvic junction. Associated proximal hydronephrosis. No ureterolithiasis distally. Right: Punctate right nephrolithiasis. No right ureterolithiasis. No right hydroureteronephrosis. No perinephric or  periureteral stranding. Urinary bladder is unremarkable.  GI AND BOWEL: Stomach demonstrates no acute abnormality. No small or large bowel thickening or dilatation. The appendix is unremarkable. There is no bowel obstruction.  PERITONEUM AND RETROPERITONEUM: No ascites. No free air.  VASCULATURE: Aorta is normal in caliber.  LYMPH NODES: No lymphadenopathy.  REPRODUCTIVE ORGANS: The uterus is unremarkable. No adnexal mass.  BONES AND SOFT TISSUES: No acute osseous abnormality. No focal soft tissue abnormality.  IMPRESSION: 1. Obstructive 6 mm left ureteropelvic junction stone. 2. Nonobstructive punctate right nephrolithiasis.  Electronically signed by: Morgane Naveau MD 01/05/2025 12:29 AM EST RP Workstation: [id number]  I personally reviewed the images referenced above and note a 6 mm left UPJ stone.  Assessment & Plan:   1. Left ureteral stone (Primary) Renal colic secondary to a 6 mm left UPJ stone.  UA bland, no evidence of infection.  We discussed various treatment options for her stone including trial of passage vs. ESWL vs. ureteroscopy with laser lithotripsy and stent.  We specifically discussed that ESWL is a less invasive procedure that requires less anesthesia, however patients have to pass their residual stone fragments, which may take several weeks and be associated with continued renal colic. Additionally, we discussed the limitations of ESWL including the ability  to only treat one stone in a single treatment and the low, 10-20% chance of treatment failure requiring repeat ESWL versus ureteroscopy in the future (we specifically discussed that her stone is rather dense, so I would estimate her chance of treatment failure a bit higher, closer to 20%). By comparison, ureteroscopy is a more invasive surgical procedure that requires more anesthesia and placement of a ureteral stent, which will remain in place for approximately 3-10 days and can be associated with flank  pain, bladder pain, dysuria, urgency, frequency, urinary leakage, and gross hematuria.  Based on this conversation, she would like to proceed with ESWL.  I am refilling oxycodone  and Zofran  for symptom control in the meantime.  I advised her to hold all NSAIDs and tomorrow's dose of Wegovy  in advance of procedure in 2 days.  They are self-pay and we discussed that there will likely be an upfront payment required for this.  They understand and wish to proceed. - Urinalysis, Complete - Ambulatory Referral For Surgery Scheduling - CULTURE, URINE COMPREHENSIVE - oxyCODONE  (ROXICODONE ) 5 MG immediate release tablet; Take 1 tablet (5 mg total) by mouth every 6 (six) hours as needed.  Dispense: 12 tablet; Refill: 0 - ondansetron  (ZOFRAN -ODT) 4 MG disintegrating tablet; Take 1 tablet (4 mg total) by mouth every 6 (six) hours as needed for nausea or vomiting.  Dispense: 30 tablet; Refill: 0  2. Recurrent nephrolithiasis Recurrent nephrolithiasis since age 28.  I recommended metabolic workup after clearance of her stone and they agreed.  3. Chronic constipation No known IBD to contribute to #2 above, though she does have chronic nausea and constipation.  She requests a second opinion with GI, referral placed today. - Ambulatory referral to Gastroenterology   Return in 2 days (on 01/13/2025) for ESWL with Dr. Twylla.  Lucie Hones, PA-C  [facility] [place] [address] [place], [place] [address] [phone]      [1] No Known Allergies  "

## 2025-01-11 NOTE — Patient Instructions (Addendum)
 Our scheduler, Eleanor, will contact you regarding shockwave lithotripsy this Thursday. In the meantime, please do the following: -Stay well hydrated -Take Flomax  0.4mg  daily -Treat any pain with Tylenol  (acetaminophen ), or oxycodone  -Treat any nausea with Zofran  -DO NOT TAKE NSAIDS (Advil , Aspirin, Aleve , etc.) STARTING TODAY -DO NOT TAKE WEGOVY  THIS WEEK  Please call our office immediately (we are open 8a-5p Monday-Thursday and 8a-12p Friday) or go to the Emergency Department if you develop any of the following: -Fever/chills -Pain uncontrollable with oxycodone  -Nausea and/or vomiting uncontrollable with Zofran 

## 2025-01-13 ENCOUNTER — Ambulatory Visit: Payer: Self-pay

## 2025-01-13 ENCOUNTER — Ambulatory Visit
Admission: RE | Admit: 2025-01-13 | Discharge: 2025-01-13 | Disposition: A | Discharge status: Home / Self Care | Payer: Self-pay | Attending: Urology | Admitting: Urology

## 2025-01-13 ENCOUNTER — Encounter: Admission: RE | Disposition: A | Payer: Self-pay | Source: Home / Self Care | Attending: Urology

## 2025-01-13 ENCOUNTER — Other Ambulatory Visit: Payer: Self-pay

## 2025-01-13 DIAGNOSIS — N201 Calculus of ureter: Secondary | ICD-10-CM | POA: Insufficient documentation

## 2025-01-13 LAB — POCT PREGNANCY, URINE: Preg Test, Ur: NEGATIVE

## 2025-01-13 MED ORDER — SODIUM CHLORIDE 0.9 % IV SOLN: INTRAVENOUS | Status: DC

## 2025-01-13 MED ORDER — DIAZEPAM 5 MG PO TABS
10.0000 mg | ORAL_TABLET | ORAL | Status: AC
  Administered 2025-01-13: 10 mg via ORAL

## 2025-01-13 MED ORDER — CEPHALEXIN 500 MG PO CAPS
ORAL_CAPSULE | ORAL | Status: AC
  Filled 2025-01-13: qty 1

## 2025-01-13 MED ORDER — CEPHALEXIN 500 MG PO CAPS
500.0000 mg | ORAL_CAPSULE | Freq: Once | ORAL | Status: AC
  Administered 2025-01-13: 500 mg via ORAL

## 2025-01-13 MED ORDER — DIAZEPAM 5 MG PO TABS
ORAL_TABLET | ORAL | Status: AC
  Filled 2025-01-13: qty 2

## 2025-01-13 MED ORDER — DIPHENHYDRAMINE HCL 25 MG PO CAPS
25.0000 mg | ORAL_CAPSULE | ORAL | Status: AC
  Administered 2025-01-13: 25 mg via ORAL

## 2025-01-13 MED ORDER — ONDANSETRON HCL 4 MG/2ML IJ SOLN
INTRAMUSCULAR | Status: AC
  Filled 2025-01-13: qty 2

## 2025-01-13 MED ORDER — DIPHENHYDRAMINE HCL 25 MG PO CAPS
ORAL_CAPSULE | ORAL | Status: AC
  Filled 2025-01-13: qty 1

## 2025-01-13 MED ORDER — TAMSULOSIN HCL 0.4 MG PO CAPS: 0.4000 mg | ORAL_CAPSULE | Freq: Every day | ORAL | 0 refills | Status: AC

## 2025-01-13 MED ORDER — ONDANSETRON HCL 4 MG/2ML IJ SOLN
4.0000 mg | Freq: Once | INTRAMUSCULAR | Status: AC
  Administered 2025-01-13: 4 mg via INTRAVENOUS

## 2025-01-13 NOTE — Interval H&P Note (Signed)
 History and Physical Interval Note:  01/13/2025 2:52 PM  Kathryn Estrada  has presented today for surgery, with the diagnosis of Left Ureteral Stone.  The various methods of treatment have been discussed with the patient and family. After consideration of risks, benefits and other options for treatment, the patient has consented to  Procedures: LITHOTRIPSY, ESWL (Left) as a surgical intervention.  The patient's history has been reviewed, patient examined, no change in status, stable for surgery.  I have reviewed the patient's chart and labs.  Questions were answered to the patient's satisfaction.    CV:RRR Lungs:clear   Glendia JAYSON Barba

## 2025-01-13 NOTE — Discharge Instructions (Addendum)
 As per the [facility] discharge instructions Continue pain and nausea medication as needed A prescription for tamsulosin  which will help you pass stone fragments was also sent to your pharmacy Call [facility] at [phone] for pain not controlled with oral medications or fever greater than 101 degrees Our office will contact you for a follow-up appointment in 1-2 weeks

## 2025-01-14 ENCOUNTER — Encounter: Payer: Self-pay | Admitting: Urology

## 2025-01-14 ENCOUNTER — Other Ambulatory Visit: Payer: Self-pay

## 2025-01-14 DIAGNOSIS — N201 Calculus of ureter: Secondary | ICD-10-CM

## 2025-01-14 DIAGNOSIS — N200 Calculus of kidney: Secondary | ICD-10-CM

## 2025-01-14 LAB — CULTURE, URINE COMPREHENSIVE

## 2025-01-21 ENCOUNTER — Encounter: Payer: Self-pay | Admitting: Urology

## 2025-02-08 ENCOUNTER — Ambulatory Visit: Payer: Self-pay | Admitting: Physician Assistant
